# Patient Record
Sex: Female | Born: 1935 | Race: White | Hispanic: No | Marital: Married | Smoking: Never smoker
Health system: Southern US, Community
[De-identification: ages and names within clinical notes are randomized; demographics above are authoritative.]

## PROBLEM LIST (undated history)

## (undated) DIAGNOSIS — T4145XA Adverse effect of unspecified anesthetic, initial encounter: Secondary | ICD-10-CM

## (undated) DIAGNOSIS — M1990 Unspecified osteoarthritis, unspecified site: Secondary | ICD-10-CM

## (undated) DIAGNOSIS — E7800 Pure hypercholesterolemia, unspecified: Secondary | ICD-10-CM

## (undated) DIAGNOSIS — R42 Dizziness and giddiness: Secondary | ICD-10-CM

## (undated) DIAGNOSIS — A498 Other bacterial infections of unspecified site: Secondary | ICD-10-CM

## (undated) DIAGNOSIS — Z789 Other specified health status: Secondary | ICD-10-CM

## (undated) DIAGNOSIS — K529 Noninfective gastroenteritis and colitis, unspecified: Secondary | ICD-10-CM

## (undated) DIAGNOSIS — M5430 Sciatica, unspecified side: Secondary | ICD-10-CM

## (undated) DIAGNOSIS — T8859XA Other complications of anesthesia, initial encounter: Secondary | ICD-10-CM

## (undated) DIAGNOSIS — L219 Seborrheic dermatitis, unspecified: Secondary | ICD-10-CM

## (undated) HISTORY — PX: EAR BIOPSY: SHX1480

## (undated) HISTORY — PX: CATARACT EXTRACTION: SUR2

## (undated) HISTORY — PX: COLONOSCOPY: SHX174

## (undated) HISTORY — PX: LUMBAR DISC SURGERY: SHX700

## (undated) HISTORY — PX: WISDOM TOOTH EXTRACTION: SHX21

---

## 1960-11-18 HISTORY — PX: APPENDECTOMY: SHX54

## 1997-11-18 HISTORY — PX: HEMORROIDECTOMY: SUR656

## 1998-02-21 ENCOUNTER — Other Ambulatory Visit: Admission: RE | Admit: 1998-02-21 | Discharge: 1998-02-21 | Payer: Self-pay | Admitting: Gastroenterology

## 1998-03-06 ENCOUNTER — Inpatient Hospital Stay (HOSPITAL_COMMUNITY): Admission: AD | Admit: 1998-03-06 | Discharge: 1998-03-11 | Payer: Self-pay | Admitting: Internal Medicine

## 1998-06-01 ENCOUNTER — Other Ambulatory Visit: Admission: RE | Admit: 1998-06-01 | Discharge: 1998-06-01 | Payer: Self-pay | Admitting: Obstetrics and Gynecology

## 1998-06-01 ENCOUNTER — Other Ambulatory Visit: Admission: RE | Admit: 1998-06-01 | Discharge: 1998-06-01 | Payer: Self-pay | Admitting: *Deleted

## 1998-06-03 ENCOUNTER — Other Ambulatory Visit: Admission: RE | Admit: 1998-06-03 | Discharge: 1998-06-03 | Payer: Self-pay | Admitting: Gastroenterology

## 1998-06-06 ENCOUNTER — Other Ambulatory Visit: Admission: RE | Admit: 1998-06-06 | Discharge: 1998-06-06 | Payer: Self-pay | Admitting: *Deleted

## 1998-06-07 ENCOUNTER — Ambulatory Visit (HOSPITAL_COMMUNITY): Admission: RE | Admit: 1998-06-07 | Discharge: 1998-06-07 | Payer: Self-pay | Admitting: Gastroenterology

## 1998-06-08 ENCOUNTER — Ambulatory Visit (HOSPITAL_COMMUNITY): Admission: RE | Admit: 1998-06-08 | Discharge: 1998-06-08 | Payer: Self-pay | Admitting: Family Medicine

## 2004-10-23 ENCOUNTER — Emergency Department (HOSPITAL_COMMUNITY): Admission: EM | Admit: 2004-10-23 | Discharge: 2004-10-23 | Payer: Self-pay | Admitting: Emergency Medicine

## 2008-02-07 ENCOUNTER — Inpatient Hospital Stay (HOSPITAL_COMMUNITY): Admission: EM | Admit: 2008-02-07 | Discharge: 2008-02-09 | Payer: Self-pay | Admitting: Emergency Medicine

## 2008-02-08 ENCOUNTER — Encounter (INDEPENDENT_AMBULATORY_CARE_PROVIDER_SITE_OTHER): Payer: Self-pay | Admitting: Cardiology

## 2008-06-27 ENCOUNTER — Ambulatory Visit (HOSPITAL_BASED_OUTPATIENT_CLINIC_OR_DEPARTMENT_OTHER): Admission: RE | Admit: 2008-06-27 | Discharge: 2008-06-27 | Payer: Self-pay | Admitting: Family Medicine

## 2009-03-15 DIAGNOSIS — R42 Dizziness and giddiness: Secondary | ICD-10-CM | POA: Insufficient documentation

## 2009-03-17 ENCOUNTER — Ambulatory Visit: Payer: Self-pay | Admitting: Cardiology

## 2009-03-17 DIAGNOSIS — R0602 Shortness of breath: Secondary | ICD-10-CM | POA: Insufficient documentation

## 2009-03-17 DIAGNOSIS — R002 Palpitations: Secondary | ICD-10-CM | POA: Insufficient documentation

## 2009-03-20 ENCOUNTER — Encounter: Payer: Self-pay | Admitting: Cardiology

## 2009-03-29 ENCOUNTER — Ambulatory Visit: Payer: Self-pay

## 2009-03-29 ENCOUNTER — Encounter: Payer: Self-pay | Admitting: Cardiology

## 2009-03-30 ENCOUNTER — Telehealth: Payer: Self-pay | Admitting: Cardiology

## 2009-04-03 ENCOUNTER — Ambulatory Visit: Payer: Self-pay | Admitting: Cardiology

## 2009-04-03 DIAGNOSIS — E785 Hyperlipidemia, unspecified: Secondary | ICD-10-CM | POA: Insufficient documentation

## 2009-04-18 ENCOUNTER — Telehealth (INDEPENDENT_AMBULATORY_CARE_PROVIDER_SITE_OTHER): Payer: Self-pay | Admitting: *Deleted

## 2010-06-14 ENCOUNTER — Encounter: Admission: RE | Admit: 2010-06-14 | Discharge: 2010-06-14 | Payer: Self-pay | Admitting: Family Medicine

## 2011-04-02 NOTE — Procedures (Signed)
NAME:  Shelby Arellano, Shelby Arellano                 ACCOUNT NO.:  [id number]   MEDICAL RECORD NO.:  [id number]          PATIENT TYPE:  OUT   LOCATION:  SLEEP CENTER                 FACILITY:  [facility]   PHYSICIAN:  Clinton D. Maple Hudson, MD, FCCP, FACPDATE OF BIRTH:  08/30/1937   DATE OF STUDY:  06/27/2008                            NOCTURNAL POLYSOMNOGRAM   REFERRING PHYSICIAN:  Tammy R. Collins Scotland, M.D.   INDICATION FOR STUDY:  Insomnia with sleep apnea.   EPWORTH SLEEPINESS SCORE:  1/24, BMI 23, weight 130 pounds, height 63  inches, neck 11.5 inches.   HOME MEDICATIONS:  Charted and reviewed.   SLEEP ARCHITECTURE:  Total sleep time 272 minutes.  Sleep efficiency  69.6%.  Stage 1 was 6.2%, stage 2 was 80.9%, stage 3 was absent, REM  12.8% of total sleep time.  Sleep latency 64 minutes, REM latency 168  minutes.  Awake after sleep onset 47 minutes.  Arousal index 56.4,  indicating increased electroencephalogram arousal.  Tylenol PM was taken  at 10 p.m.   RESPIRATORY DATA:  Apnea/hypopnea index (AHI), 0.2 per hour.  Respiratory disturbance index (RDI), 3.7 per hour.  A single event was  scored as a hypopnea.  There were insufficient events to permit split  study protocol CPAP titration on the study night.   OXYGEN DATA:  Minimal snoring with oxygen desaturation to a Nadir of  93%.  Mean oxygen saturation through the study was 95.6% on room air.   CARDIAC DATA:  Sinus rhythm with occasional PAC.   MOVEMENT/PARASOMNIA:  A total of 12 limb jerks were counted, of which 9  were associated with arousal or awakening, for a periodic limb movement  with arousal index of 2 per hour.   IMPRESSIONS/RECOMMENDATIONS:  1. Sleep architecture not remarkable for sleep environment except for      repeated brief wakings through the night, consistent with her      history, but nonspecific.  2. Negligible respiratory sleep disturbance, apnea/hypopnea index 0.2      per hour, with minimal snoring and oxygen desaturation to  a Nadir      of 93%.  3. Periodic limb movement with arousal, mild, 2 per hour.  If clinical      history suggests that this is an important      issue at home, then specific therapy such as Requip or Mirapex      might be considered.  Otherwise consider treating her complaint as      a nonspecific insomnia.      Clinton D. Maple Hudson, MD, [facility], FACP  Diplomate, Biomedical engineer of Sleep Medicine  Electronically Signed     CDY/MEDQ  D:  07/02/2008 11:21:49  T:  07/02/2008 11:35:58  Job:  [id number]

## 2011-04-02 NOTE — Discharge Summary (Signed)
[name], [name]                 ACCOUNT NO.:  [id number]   MEDICAL RECORD NO.:  [id number]          PATIENT TYPE:  INP   LOCATION:  3708                         FACILITY:  [facility]   PHYSICIAN:  Francisca December, M.D.  DATE OF BIRTH:  [birth year]   DATE OF ADMISSION:  02/07/2008  DATE OF DISCHARGE:  02/09/2008                               DISCHARGE SUMMARY   DISCHARGE DIAGNOSES:  1. Chest pain, noncardiac.  2. Multiple medication allergies including Actonel, Cipro, Compazine,      Paxil, Phenergan, prednisone, and sodium pentothal.  3. Family history of hypertension.  4. Family history of myocardial infarction.   HOSPITAL COURSE:  Shelby Arellano is a 76 year old female who was admitted on  February 07, 2008, after several episodes of waking up at night, feeling  faint.  She was unable to go back to sleep.  Over the past several days  prior to admission, she noticed that she felt a balloon coming out of  her chest, but this was unrelated to activity and lasted up to several  hours.  On the day of admission, she developed a heaviness that was  different from the described above.  She describes the heaviness  extending from her head down to her abdomen associated with rapid  breathing panting.  She denies any palpitations, dizziness, syncope,  or other symptoms.  EMS was called.  In route, she received sublingual  nitroglycerin that gradually relieved symptoms, although they did not  resolve immediately.  She states that she is fatigued and is admitted to  the hospital.   As part of her cardiac workup, we did obtain a 12-lead EKG that showed  normal sinus rhythm with no acute ST-T wave changes.  Her lab studies  show essentially normal levels with the exception of her LDL which was  178.   We did perform an adenosine Cardiolite on her, and this study was  negative for ischemia.  We felt it was safe for her to go home, and we  asked her to follow up with primary care physician regarding  her  symptoms as well as long-term followup with her cholesterol.  She has  been educated on low-fat diet.   She is discharged home on her meds that she came in on, which include  calcium, magnesium, and zinc as well as a baby aspirin daily.   She is to call Dr. Amil Amen as needed for any further problems, otherwise  she is to follow up with Dr.  Collins Scotland, and she will call either today or tomorrow to make this  appointment.      Guy Franco, P.A.      Francisca December, M.D.  Electronically Signed    LB/MEDQ  D:  02/09/2008  T:  02/10/2008  Job:  [id number]   cc:   Tammy R. Collins Scotland, M.D.

## 2011-04-02 NOTE — H&P (Signed)
NAMEANI, DEOLIVEIRA                 ACCOUNT NO.:  [id number]   MEDICAL RECORD NO.:  [id number]          PATIENT TYPE:  INP   LOCATION:  3703                         FACILITY:  [facility]   PHYSICIAN:  Francisca December, M.D.  DATE OF BIRTH:  1935-09-19   DATE OF ADMISSION:  02/07/2008  DATE OF DISCHARGE:                              HISTORY & PHYSICAL   REASON FOR ADMISSION:  Chest discomfort, faintness.   HISTORY OF PRESENT ILLNESS:  Shelby Arellano is a pleasant 76 year old woman  without prior cardiac history who for the past 2-3 weeks has been waking  up at night with a feeling faint sensation and then being unable to go  back to sleep.  Over the past several days she has noticed a balloon  sensation coming out of her chest unrelated to activity.  It can last up  to several hours.  She awoke at midnight last night with this sensation  of coming out of a faint and also had a cold sweat.  Then, later in  the day she developed a heaviness like someone sitting on me but not  specifically to her chest.  She states it extends from her nose down to  her waist.  She has also had this sensation in the past several weeks.  Finally, in the morning after daylight she was sitting at rest after  eating breakfast and had the onset of dyspnea and was breathing rapidly  for no clear reason.  The heaviness recurred and because of these  multiple symptoms EMS was called.  She received sublingual nitroglycerin  en route to the hospital and states that this gradually relieved her  symptoms.  At the time of my evaluation at almost 1700 she is without  any symptoms but feels very fatigued.   PAST MEDICAL HISTORY:  Really quite unremarkable.  Denies any  hypertension or diabetes, no hyperlipidemia.  No significant surgeries.   DRUG ALLERGIES:  Are multiple and include:  1. ACTONEL.  2. CIPRO.  3. COMPAZINE.  4. PAXIL.  5. PHENERGAN.  6. PREDNISONE.  7. SODIUM PENTOTHAL.   She was hospitalized not long ago  and received Phenergan which caused  her to be out of touch with reality for some time.   CURRENT MEDICATIONS:  Calcium, magnesium and zinc combination, and a  baby aspirin 81 mg daily.   SOCIAL HISTORY:  She is accompanied by her son and husband here in the  emergency room.  She does not use any alcohol or tobacco products.  Her  son is a Development worker, community.   REVIEW OF SYSTEMS:  Negative except as mentioned above.  She denies any  constitutional symptoms such as fever, chills, weight loss.  She has  been more fatigued as mentioned in the last few weeks.  She is usually a  very active person.  She attempted a usual 3-mile walk with her husband  yesterday but could only complete about a mile because of tiredness, I  pooped out.  She denies any headache or visual changes.  No cough or  hemoptysis.  No abdominal pain, diarrhea,  hematemesis or melena.  No  dysuria, hematuria or nocturia.  No change in her muscle strength.  No  muscle pain.  No joint pain of significance.  No neurologic symptoms.   PHYSICAL EXAMINATION:  The blood pressure is 116/73, temperature is  97.9, pulse is 70 and regular, respiratory rate 18.  GENERAL:  This a pleasant, thin, 76 year old Caucasian woman in no  distress.  HEENT:  Unremarkable.  Head is atraumatic and normocephalic.  The pupils  are equal, round, reactive to light. Sclerae are anicteric.  Extraocular  movements are intact.  Oral mucosa is pink and moist.  Tongue is not  coated.  Teeth and gums in good repair.  NECK:  Supple without  thyromegaly or masses.  The carotid upstrokes are normal.  There is no  bruit.  There is no jugular venous distention.  CHEST:  Clear with adequate excursion bilaterally.  No wheezes, rales or  rhonchi.  HEART:  Has a regular rhythm.  Normal S1 and S2 is heard.  No S3, S4,  murmur, click or rub noted.  ABDOMEN:  Soft, flat, nontender.  No hepatosplenomegaly or midline  pulsatile mass.  EXTERNAL GENITALIA:  Atrophic without  lesions.  RECTAL:  Not performed.  EXTREMITIES:  Show full range of motion.  No edema.  Intact distal  pulses.  NEUROLOGIC:  Cranial nerves II-XII intact.  Motor and sensory grossly  intact.  Gait not tested.  SKIN:  Warm, dry and clear.   ACCESSORY CLINICAL DATA:  Serum electrolytes, BUN, creatinine, glucose  all within normal limits.  Point-of-care enzymes here in the emergency  room x1 negative.  Hemoglobin 15.6, hematocrit 46.  Chest x-ray shows no  active cardiopulmonary disease.  Electrocardiogram shows left atrial  abnormality.  There is a Q wave in V2, although it looks almost  identical to V1 and may be positional in nature, as well as the P wave  is persistently inverted.  Her ST segments are at baseline on this  tracing and it looks quite normal other than an RSR prime pattern.  Her  tracing from 2005 has elevated ST segments in the anterior and lateral  precordial leads of about 1 to 1.5 mm and there is no Q wave in V2.   ASSESSMENT:  1. A constellation of symptoms which include discomfort in the chest,      sweating, faint feeling and dyspnea.  Certainly it would be      important to fully evaluate her cardiac status and rule out      coronary artery disease.  2. She has multiple medication intolerances and we will have to be      careful with regard to this during her hospitalization.  3. EKG changes are present that are not diagnostic but suspicious for      coronary disease.   PLAN:  1. The patient is admitted to the hospital for telemetry monitoring      and serial CK-MB and troponin enzymes.  2. Repeat ECG in the morning.  3. If above negative, we will proceed with noninvasive study in the      way of exercise Cardiolite.  The patient could potentially walk on      the treadmill.  4. Will hold off on any anticoagulants at this time other than      aspirin, and given her sensitivity to multiple medications really      try to avoid anything that is not absolutely  necessary.  Francisca December, M.D.  Electronically Signed    JHE/MEDQ  D:  02/07/2008  T:  02/07/2008  Job:  [id number]   cc:   Tammy R. Collins Scotland, M.D.

## 2011-06-14 ENCOUNTER — Encounter: Payer: Self-pay | Admitting: Cardiology

## 2011-06-20 ENCOUNTER — Encounter: Payer: Self-pay | Admitting: Cardiology

## 2011-07-01 ENCOUNTER — Other Ambulatory Visit: Payer: Self-pay | Admitting: Family Medicine

## 2011-07-01 DIAGNOSIS — Z1231 Encounter for screening mammogram for malignant neoplasm of breast: Secondary | ICD-10-CM

## 2011-07-10 ENCOUNTER — Ambulatory Visit
Admission: RE | Admit: 2011-07-10 | Discharge: 2011-07-10 | Disposition: A | Discharge status: Home / Self Care | Payer: Medicare Other | Source: Ambulatory Visit | Attending: Family Medicine | Admitting: Family Medicine

## 2011-07-10 DIAGNOSIS — Z1231 Encounter for screening mammogram for malignant neoplasm of breast: Secondary | ICD-10-CM

## 2011-08-12 LAB — LIPID PANEL
Cholesterol: 253 — ABNORMAL HIGH
HDL: 63
LDL Cholesterol: 178 — ABNORMAL HIGH
Total CHOL/HDL Ratio: 4
Triglycerides: 58
VLDL: 12

## 2011-08-12 LAB — CARDIAC PANEL(CRET KIN+CKTOT+MB+TROPI)
CK, MB: 0.5
CK, MB: 0.8
Relative Index: INVALID
Relative Index: INVALID
Total CK: 33
Total CK: 44
Troponin I: <0.01
Troponin I: <0.01

## 2011-08-12 LAB — POCT I-STAT CREATININE
Creatinine, Ser: 1
Operator id: 284141

## 2011-08-12 LAB — CBC
HCT: 43.5
Hemoglobin: 14.3
MCHC: 32.8
MCV: 95.6
Platelets: 190
RBC: 4.55
RDW: 14.7
WBC: 8

## 2011-08-12 LAB — I-STAT 8, (EC8 V) (CONVERTED LAB)
Acid-base deficit: 1
BUN: 12
Bicarbonate: 24.1 — ABNORMAL HIGH
Chloride: 107
Glucose, Bld: 80
HCT: 46
Hemoglobin: 15.6 — ABNORMAL HIGH
Operator id: 284141
Potassium: 3.9
Sodium: 138
TCO2: 25
pCO2, Ven: 38.8 — ABNORMAL LOW
pH, Ven: 7.401 — ABNORMAL HIGH

## 2011-08-12 LAB — POCT CARDIAC MARKERS
CKMB, poc: <1 — ABNORMAL LOW
Myoglobin, poc: 22.2
Operator id: 284141
Troponin i, poc: <0.05

## 2011-08-12 LAB — COMPREHENSIVE METABOLIC PANEL
ALT: 14
AST: 26
Albumin: 3.3 — ABNORMAL LOW
Alkaline Phosphatase: 54
BUN: 11
CO2: 22
Calcium: 8.8
Chloride: 104
Creatinine, Ser: 1
GFR calc Af Amer: >60
GFR calc non Af Amer: 55 — ABNORMAL LOW
Glucose, Bld: 126 — ABNORMAL HIGH
Potassium: 3.6
Sodium: 136
Total Bilirubin: 0.8
Total Protein: 6.1

## 2011-08-12 LAB — TSH: TSH: 2.216

## 2011-08-12 LAB — CK TOTAL AND CKMB (NOT AT ARMC)
CK, MB: 0.4
Relative Index: INVALID
Total CK: 38

## 2011-08-12 LAB — TROPONIN I: Troponin I: <0.01

## 2011-08-19 HISTORY — PX: CARPAL TUNNEL RELEASE: SHX101

## 2011-08-22 ENCOUNTER — Encounter (HOSPITAL_BASED_OUTPATIENT_CLINIC_OR_DEPARTMENT_OTHER)
Admission: RE | Admit: 2011-08-22 | Discharge: 2011-08-22 | Disposition: A | Discharge status: Home / Self Care | Payer: Medicare Other | Source: Ambulatory Visit | Attending: Orthopedic Surgery | Admitting: Orthopedic Surgery

## 2011-08-22 ENCOUNTER — Ambulatory Visit
Admission: RE | Admit: 2011-08-22 | Discharge: 2011-08-22 | Disposition: A | Discharge status: Home / Self Care | Payer: Medicare Other | Source: Ambulatory Visit | Attending: Orthopedic Surgery | Admitting: Orthopedic Surgery

## 2011-08-22 ENCOUNTER — Other Ambulatory Visit: Payer: Self-pay | Admitting: Orthopedic Surgery

## 2011-08-22 DIAGNOSIS — Z01811 Encounter for preprocedural respiratory examination: Secondary | ICD-10-CM

## 2011-08-27 ENCOUNTER — Ambulatory Visit (HOSPITAL_BASED_OUTPATIENT_CLINIC_OR_DEPARTMENT_OTHER)
Admission: RE | Admit: 2011-08-27 | Discharge: 2011-08-27 | Disposition: A | Discharge status: Home / Self Care | Payer: Medicare Other | Source: Ambulatory Visit | Attending: Orthopedic Surgery | Admitting: Orthopedic Surgery

## 2011-08-27 DIAGNOSIS — Z01812 Encounter for preprocedural laboratory examination: Secondary | ICD-10-CM | POA: Insufficient documentation

## 2011-08-27 DIAGNOSIS — G5600 Carpal tunnel syndrome, unspecified upper limb: Secondary | ICD-10-CM | POA: Insufficient documentation

## 2011-08-27 DIAGNOSIS — M129 Arthropathy, unspecified: Secondary | ICD-10-CM | POA: Insufficient documentation

## 2011-08-27 DIAGNOSIS — G609 Hereditary and idiopathic neuropathy, unspecified: Secondary | ICD-10-CM | POA: Insufficient documentation

## 2011-08-27 DIAGNOSIS — Z01810 Encounter for preprocedural cardiovascular examination: Secondary | ICD-10-CM | POA: Insufficient documentation

## 2011-08-27 LAB — POCT HEMOGLOBIN-HEMACUE: Hemoglobin: 13.7 g/dL (ref 12.0–15.0)

## 2011-09-03 NOTE — Op Note (Signed)
  NAMEHAIVEN, Shelby Arellano                 ACCOUNT NO.:  [id number]  MEDICAL RECORD NO.:  [id number]  LOCATION:                                 FACILITY:  PHYSICIAN:  Cindee Salt, M.D.       DATE OF BIRTH:  04-10-1935  DATE OF PROCEDURE:  08/27/2011 DATE OF DISCHARGE:                              OPERATIVE REPORT   PREOPERATIVE DIAGNOSIS:  Carpal tunnel syndrome, right hand.  POSTOPERATIVE DIAGNOSIS:  Carpal tunnel syndrome, right hand.  OPERATION:  Decompression, right median nerve.  SURGEON:  Cindee Salt, MD  ANESTHESIA:  Forearm-based IV regional.  HISTORY:  The patient is a 76 year old female with a history of carpal tunnel syndrome, EMG nerve conductions positive which has not responded to conservative treatment.  Pre, peri, and postoperative course were discussed along with risks and complications.  She is aware that there is no guarantee with surgery, possibility of infection, recurrence of injury to arteries, nerves, and tendons, incomplete relief of symptoms, and dystrophy.  In the preoperative area, the patient is seen, the extremity is marked by both the patient and surgeon, and antibiotic is given.  PROCEDURE:  The patient was brought to the operating room where a forearm-based IV regional anesthetic was carried out without difficulty. She was prepped using ChloraPrep, supine position with the right arm free.  A 3-minute dry time was allowed.  A time-out was taken confirming the patient and procedure.  A longitudinal incision was made in the palm and carried down through the subcutaneous tissue.  Bleeders were electrocauterized with bipolar.  The palmar fascia was split. Superficial palmar arch was identified.  The flexor tendon to the ring and little finger was identified.  Retractor was placed to the ulnar side of the median nerve.  Carpal retinaculum was incised with sharp dissection.  Right angle and Sewall retractor were placed between skin and forearm fascia.   The fascia was released for approximately 1-1/2 proximal to the wrist crease under direct vision.  Canal was explored. Air compression to the nerve was apparent.  No further lesions were identified.  The wound was irrigated.  The skin was then closed with interrupted 5-0 Vicryl repeat sutures.  A local infiltration with 0.25% Marcaine without epinephrine was given, approximately 9 mL was used. Sterile compressive dressing was applied.  On deflation of the tourniquet, all fingers immediately pinked.  The patient tolerated the procedure well and was taken to the recovery room for observation in satisfactory condition.  DISCHARGE SUMMARY:  She will be discharged home to return to the [facility] [place] in 1 week on Vicodin.          ______________________________ Cindee Salt, M.D.     GK/MEDQ  D:  08/27/2011  T:  08/27/2011  Job:  [id number]  Electronically Signed by Cindee Salt M.D. on 09/03/2011 04:27:06 PM

## 2012-02-06 ENCOUNTER — Other Ambulatory Visit: Payer: Self-pay | Admitting: Orthopedic Surgery

## 2012-02-20 ENCOUNTER — Encounter (HOSPITAL_BASED_OUTPATIENT_CLINIC_OR_DEPARTMENT_OTHER): Payer: Self-pay | Admitting: *Deleted

## 2012-02-20 NOTE — Progress Notes (Signed)
No labs needed

## 2012-02-25 ENCOUNTER — Encounter (HOSPITAL_BASED_OUTPATIENT_CLINIC_OR_DEPARTMENT_OTHER): Payer: Self-pay | Admitting: Anesthesiology

## 2012-02-25 ENCOUNTER — Encounter (HOSPITAL_BASED_OUTPATIENT_CLINIC_OR_DEPARTMENT_OTHER): Payer: Self-pay | Admitting: Certified Registered"

## 2012-02-25 ENCOUNTER — Encounter (HOSPITAL_BASED_OUTPATIENT_CLINIC_OR_DEPARTMENT_OTHER): Payer: Self-pay | Admitting: Orthopedic Surgery

## 2012-02-25 ENCOUNTER — Encounter (HOSPITAL_BASED_OUTPATIENT_CLINIC_OR_DEPARTMENT_OTHER)
Admission: RE | Disposition: A | Discharge status: Home / Self Care | Payer: Self-pay | Source: Ambulatory Visit | Attending: Orthopedic Surgery

## 2012-02-25 ENCOUNTER — Ambulatory Visit (HOSPITAL_BASED_OUTPATIENT_CLINIC_OR_DEPARTMENT_OTHER): Payer: Medicare Other | Admitting: Certified Registered"

## 2012-02-25 ENCOUNTER — Ambulatory Visit (HOSPITAL_BASED_OUTPATIENT_CLINIC_OR_DEPARTMENT_OTHER)
Admission: RE | Admit: 2012-02-25 | Discharge: 2012-02-25 | Disposition: A | Discharge status: Home / Self Care | Payer: Medicare Other | Source: Ambulatory Visit | Attending: Orthopedic Surgery | Admitting: Orthopedic Surgery

## 2012-02-25 DIAGNOSIS — G5600 Carpal tunnel syndrome, unspecified upper limb: Secondary | ICD-10-CM | POA: Insufficient documentation

## 2012-02-25 HISTORY — DX: Unspecified osteoarthritis, unspecified site: M19.90

## 2012-02-25 HISTORY — DX: Other complications of anesthesia, initial encounter: T88.59XA

## 2012-02-25 HISTORY — DX: Other specified health status: Z78.9

## 2012-02-25 HISTORY — DX: Noninfective gastroenteritis and colitis, unspecified: K52.9

## 2012-02-25 HISTORY — PX: CARPAL TUNNEL RELEASE: SHX101

## 2012-02-25 HISTORY — DX: Adverse effect of unspecified anesthetic, initial encounter: T41.45XA

## 2012-02-25 LAB — POCT HEMOGLOBIN-HEMACUE: Hemoglobin: 13 g/dL (ref 12.0–15.0)

## 2012-02-25 SURGERY — CARPAL TUNNEL RELEASE
Anesthesia: Regional | Site: Hand | Laterality: Left | Wound class: Clean

## 2012-02-25 MED ORDER — CHLORHEXIDINE GLUCONATE 4 % EX LIQD: 60.0000 mL | Freq: Once | CUTANEOUS | Status: DC

## 2012-02-25 MED ORDER — METOCLOPRAMIDE HCL 5 MG/ML IJ SOLN: 10.0000 mg | Freq: Once | INTRAMUSCULAR | Status: DC | PRN

## 2012-02-25 MED ORDER — LACTATED RINGERS IV SOLN
INTRAVENOUS | Status: DC
  Administered 2012-02-25 (×2): via INTRAVENOUS

## 2012-02-25 MED ORDER — CEFAZOLIN SODIUM 1-5 GM-% IV SOLN
1.0000 g | INTRAVENOUS | Status: AC
  Administered 2012-02-25: 1 g via INTRAVENOUS

## 2012-02-25 MED ORDER — LIDOCAINE HCL (PF) 0.5 % IJ SOLN
INTRAMUSCULAR | Status: DC | PRN
  Administered 2012-02-25: 30 mL via INTRATHECAL

## 2012-02-25 MED ORDER — BUPIVACAINE HCL (PF) 0.25 % IJ SOLN
INTRAMUSCULAR | Status: DC | PRN
  Administered 2012-02-25: 6 mL

## 2012-02-25 MED ORDER — 0.9 % SODIUM CHLORIDE (POUR BTL) OPTIME
TOPICAL | Status: DC | PRN
  Administered 2012-02-25: 100 mL

## 2012-02-25 MED ORDER — PROPOFOL 10 MG/ML IV BOLUS
INTRAVENOUS | Status: DC | PRN
  Administered 2012-02-25 (×3): 20 mg via INTRAVENOUS
  Administered 2012-02-25: 10 mg via INTRAVENOUS

## 2012-02-25 MED ORDER — FENTANYL CITRATE 0.05 MG/ML IJ SOLN: 25.0000 ug | INTRAMUSCULAR | Status: DC | PRN

## 2012-02-25 SURGICAL SUPPLY — 37 items
BANDAGE GAUZE ELAST BULKY 4 IN (GAUZE/BANDAGES/DRESSINGS) ×2 IMPLANT
BLADE SURG 15 STRL LF DISP TIS (BLADE) ×1 IMPLANT
BLADE SURG 15 STRL SS (BLADE) ×2
BNDG CMPR 9X4 STRL LF SNTH (GAUZE/BANDAGES/DRESSINGS)
BNDG COHESIVE 3X5 TAN STRL LF (GAUZE/BANDAGES/DRESSINGS) ×2 IMPLANT
BNDG ESMARK 4X9 LF (GAUZE/BANDAGES/DRESSINGS) IMPLANT
CHLORAPREP W/TINT 26ML (MISCELLANEOUS) ×2 IMPLANT
CLOTH BEACON ORANGE TIMEOUT ST (SAFETY) ×2 IMPLANT
CORDS BIPOLAR (ELECTRODE) ×2 IMPLANT
COVER MAYO STAND STRL (DRAPES) ×2 IMPLANT
COVER TABLE BACK 60X90 (DRAPES) ×2 IMPLANT
CUFF TOURNIQUET SINGLE 18IN (TOURNIQUET CUFF) ×2 IMPLANT
DRAPE EXTREMITY T 121X128X90 (DRAPE) ×2 IMPLANT
DRAPE SURG 17X23 STRL (DRAPES) ×2 IMPLANT
DRSG KUZMA FLUFF (GAUZE/BANDAGES/DRESSINGS) ×2 IMPLANT
GAUZE XEROFORM 1X8 LF (GAUZE/BANDAGES/DRESSINGS) ×2 IMPLANT
GLOVE BIO SURGEON STRL SZ 6.5 (GLOVE) ×2 IMPLANT
GLOVE BIO SURGEON STRL SZ7 (GLOVE) ×1 IMPLANT
GLOVE INDICATOR 7.0 STRL GRN (GLOVE) ×1 IMPLANT
GLOVE SURG ORTHO 8.0 STRL STRW (GLOVE) ×2 IMPLANT
GOWN BRE IMP PREV XXLGXLNG (GOWN DISPOSABLE) ×2 IMPLANT
GOWN PREVENTION PLUS XLARGE (GOWN DISPOSABLE) ×2 IMPLANT
NEEDLE 27GAX1X1/2 (NEEDLE) ×1 IMPLANT
NS IRRIG 1000ML POUR BTL (IV SOLUTION) ×2 IMPLANT
PACK BASIN DAY SURGERY FS (CUSTOM PROCEDURE TRAY) ×2 IMPLANT
PAD CAST 3X4 CTTN HI CHSV (CAST SUPPLIES) ×1 IMPLANT
PADDING CAST ABS 4INX4YD NS (CAST SUPPLIES) ×1
PADDING CAST ABS COTTON 4X4 ST (CAST SUPPLIES) ×1 IMPLANT
PADDING CAST COTTON 3X4 STRL (CAST SUPPLIES) ×2
SPONGE GAUZE 4X4 12PLY (GAUZE/BANDAGES/DRESSINGS) ×2 IMPLANT
STOCKINETTE 4X48 STRL (DRAPES) ×2 IMPLANT
SUT VICRYL 4-0 PS2 18IN ABS (SUTURE) IMPLANT
SUT VICRYL RAPIDE 4/0 PS 2 (SUTURE) ×2 IMPLANT
SYR BULB 3OZ (MISCELLANEOUS) ×2 IMPLANT
SYR CONTROL 10ML LL (SYRINGE) ×1 IMPLANT
TOWEL OR 17X24 6PK STRL BLUE (TOWEL DISPOSABLE) ×2 IMPLANT
UNDERPAD 30X30 INCONTINENT (UNDERPADS AND DIAPERS) ×2 IMPLANT

## 2012-02-25 NOTE — Discharge Instructions (Addendum)

## 2012-02-25 NOTE — Anesthesia Preprocedure Evaluation (Addendum)
Anesthesia Evaluation  Patient identified by MRN, date of birth, ID band Patient awake    Reviewed: Allergy & Precautions, H&P , NPO status , Patient's Chart, lab work & pertinent test results, reviewed documented beta blocker date and time   History of Anesthesia Complications (+) PROLONGED EMERGENCE  Airway Mallampati: II TM Distance: >3 FB Neck ROM: full    Dental  (+) Dental Advisory Given   Pulmonary shortness of breath and with exertion,          Cardiovascular negative cardio ROS      Neuro/Psych negative neurological ROS  negative psych ROS   GI/Hepatic negative GI ROS, Neg liver ROS,   Endo/Other  negative endocrine ROS  Renal/GU negative Renal ROS  negative genitourinary   Musculoskeletal   Abdominal   Peds  Hematology negative hematology ROS (+)   Anesthesia Other Findings See surgeon's H&P   Reproductive/Obstetrics negative OB ROS                        Anesthesia Physical Anesthesia Plan  ASA: II  Anesthesia Plan: MAC and Bier Block   Post-op Pain Management:    Induction: Intravenous  Airway Management Planned: Simple Face Mask  Additional Equipment:   Intra-op Plan:   Post-operative Plan:   Informed Consent: I have reviewed the patients History and Physical, chart, labs and discussed the procedure including the risks, benefits and alternatives for the proposed anesthesia with the patient or authorized representative who has indicated his/her understanding and acceptance.     Plan Discussed with: CRNA and Surgeon  Anesthesia Plan Comments:         Anesthesia Quick Evaluation

## 2012-02-25 NOTE — Op Note (Signed)
Dictated number: [id number]

## 2012-02-25 NOTE — Transfer of Care (Signed)
Immediate Anesthesia Transfer of Care Note  Patient: Shelby Arellano  Procedure(s) Performed: Procedure(s) (LRB): CARPAL TUNNEL RELEASE (Left)  Patient Location: PACU  Anesthesia Type: MAC and Bier block  Level of Consciousness: awake, alert  and oriented  Airway & Oxygen Therapy: Patient Spontanous Breathing and Patient connected to face mask oxygen  Post-op Assessment: Report given to PACU RN and Post -op Vital signs reviewed and stable  Post vital signs: Reviewed and stable  Complications: No apparent anesthesia complications

## 2012-02-25 NOTE — Op Note (Signed)
NAMECHERYLL, Arellano NO.:  [id number]  MEDICAL RECORD NO.:  [id number]  LOCATION:                                 FACILITY:  PHYSICIAN:  Cindee Salt, M.D.            DATE OF BIRTH:  DATE OF PROCEDURE:  02/25/2012 DATE OF DISCHARGE:                              OPERATIVE REPORT   PREOPERATIVE DIAGNOSIS:  Carpal tunnel syndrome, left hand.  POSTOPERATIVE DIAGNOSIS:  Carpal tunnel syndrome, left hand.  OPERATION:  Decompression of left median nerve.  SURGEON:  Cindee Salt, M.D.  ANESTHESIA:  Forearm-based IV regional with local infiltration.  ANESTHESIOLOGISTS:  Janetta Hora. Gelene Mink, M.D.  HISTORY:  The patient is a 77 year old female with a history of bilateral carpal tunnel syndrome.  Nerve conductions positive.  This has not responded to conservative treatment.  She has elected to undergo surgical release.  She has undergone to the right side and is admitted now for the left.  Pre, peri, postoperative course has been discussed along with risks and complications.  She is aware that there is no guarantee with surgery, possibility of infection, recurrence, injury to arteries, nerves, tendons, incomplete relief of symptoms, dystrophy. Preoperative area, the patient is seen, the extremity marked by both the patient and surgeon.  Antibiotic given.  PROCEDURE:  The patient was brought to the operating room where forearm- based IV regional anesthetic was carried out without difficulty.  She was prepped using ChloraPrep, supine position, left arm free.  A 3 minutes dry time was allowed.  Time-out taken for the patient and procedure.  A longitudinal incision was made in the palm, carried down through subcutaneous tissue.  Bleeders were electrocauterized.  Palmar fascia was split.  Superficial palmar arch identified.  The flexor tendon to the ring and little finger identified to the ulnar side of median nerve.  The carpal retinaculum was incised with sharp  dissection. A right angle and Sewall retractor were placed between skin and forearm fascia.  The fascia released for approximately a cm and half proximal to the wrist crease under direct vision.  Canal was explored.  Air compression to the nerve was apparent.  No further lesions were identified.  Wound was irrigated.  Skin closed with interrupted 4-0 Vicryl Rapide sutures.  Local infiltration with 0.25% Marcaine without epinephrine was given, 6 mL was used.  Sterile compressive dressing with fingers free applied.  On deflation of the tourniquet, all fingers immediately pinked, and she was taken to the recovery room for observation in satisfactory condition.          ______________________________ Cindee Salt, M.D.     GK/MEDQ  D:  02/25/2012  T:  02/25/2012  Job:  [id number]

## 2012-02-25 NOTE — Brief Op Note (Signed)
02/25/2012  9:08 AM  PATIENT:  Herbie Drape  77 y.o. female  PRE-OPERATIVE DIAGNOSIS:  LEFT Carpal Tunnel Syndrome  POST-OPERATIVE DIAGNOSIS:  LEFT Carpal Tunnel Syndrome  PROCEDURE:  Procedure(s) (LRB): CARPAL TUNNEL RELEASE (Left)  SURGEON:  Surgeon(s) and Role:    * Nicki Reaper, MD - Primary  PHYSICIAN ASSISTANT:   ASSISTANTS: none   ANESTHESIA:   local and regional  EBL:  Total I/O In: 500 [I.V.:500] Out: -   BLOOD ADMINISTERED:none  DRAINS: none   LOCAL MEDICATIONS USED:  MARCAINE     SPECIMEN:  No Specimen  DISPOSITION OF SPECIMEN:  N/A  COUNTS:  YES  TOURNIQUET:   Total Tourniquet Time Documented: Forearm (Left) - 18 minutes  DICTATION: .Other Dictation: Dictation Number [id number]  PLAN OF CARE: Discharge to home after PACU  PATIENT DISPOSITION:  PACU - hemodynamically stable.

## 2012-02-25 NOTE — Anesthesia Postprocedure Evaluation (Signed)
Anesthesia Post Note  Patient: Shelby Arellano  Procedure(s) Performed: Procedure(s) (LRB): CARPAL TUNNEL RELEASE (Left)  Anesthesia type: MAC  Patient location: PACU  Post pain: Pain level controlled  Post assessment: Patient's Cardiovascular Status Stable  Last Vitals:  Filed Vitals:   02/25/12 0945  BP: 137/77  Pulse: 62  Temp:   Resp: 13    Post vital signs: Reviewed and stable  Level of consciousness: alert  Complications: No apparent anesthesia complications

## 2012-02-25 NOTE — H&P (Signed)
Shelby Arellano is 77 year old and is seen for examination of her carpal tunnel release right hand which is doing well. Her left side continues to bother her with numbness and tingling. She has a positive Tinel's, deep pressure over the carpal canal. Her nerve conduction test are positive for carpal tunnel syndrome. There is no atrophy to the thenar intrinsics.  She is also complaining of catching of her right thumb. This began approximately one month ago.   ALLERGIES:   Sodium pentothal, prednisone, Phenergan, Compazine, promethazine, Paxil, Cipro and Actonel.  Advised never to take "broad-spectrum" antibiotics by [facility] in 1999.  MEDICATIONS:    Calcium 1200 mg., vitamin D 2000 units, fish oil 2000 mg.  SURGICAL HISTORY:    Wisdom teeth removal, appendectomy, hemorrhoidectomy, removal of sun damaged skin.   FAMILY MEDICAL HISTORY:   Positive for high blood pressure and arthritis.  SOCIAL HISTORY:    She does not smoke or drink.  She is married and works as a Nurse, learning disability.  REVIEW OF SYSTEMS:    Positive for ringing in her ears, easy bruising, sleep disorder, otherwise negative Shelby Arellano is an 78 y.o. female.   Chief Complaint: CTS HPI: see above  Past Medical History  Diagnosis Date  . Complication of anesthesia     cannot have pentothol  . Arthritis   . Chronic diarrhea   . No pertinent past medical history     Past Surgical History  Procedure Date  . Appendectomy 1962  . Hemorroidectomy 1999  . Wisdom tooth extraction   . Carpal tunnel release 10/12    rt   . Ear biopsy     growth lt ear  . Colonoscopy     No family history on file. Social History:  reports that she has never smoked. She does not have any smokeless tobacco history on file. She reports that she does not drink alcohol or use illicit drugs.  Allergies:  Allergies  Allergen Reactions  . Ciprofloxacin   . Paroxetine   . Prednisone     REACTION: hyper  . Prochlorperazine Edisylate     REACTION:  dizziness  . Promethazine Hcl     REACTION: hallucination  . Risedronate Sodium     No current facility-administered medications on file as of .   Medications Prior to Admission  Medication Sig Dispense Refill  . calcium-vitamin D (OSCAL WITH D) 500-200 MG-UNIT per tablet Take 1 tablet by mouth daily.      Marland Kitchen loperamide (IMODIUM) 2 MG capsule Take 2 mg by mouth 4 (four) times daily as needed.      . saccharomyces boulardii (FLORASTOR) 250 MG capsule Take 250 mg by mouth 2 (two) times daily.        No results found for this or any previous visit (from the past 48 hour(s)).  No results found.   Pertinent items are noted in HPI.  There were no vitals taken for this visit.  General appearance: alert, cooperative and appears stated age Head: Normocephalic, without obvious abnormality Neck: no adenopathy Resp: clear to auscultation bilaterally Cardio: regular rate and rhythm, S1, S2 normal, no murmur, click, rub or gallop GI: soft, non-tender; bowel sounds normal; no masses,  no organomegaly Extremities: extremities normal, atraumatic, no cyanosis or edema Pulses: 2+ and symmetric Skin: Skin color, texture, turgor normal. No rashes or lesions Neurologic: Grossly normal Incision/Wound: na  Assessment/Plan She is scheduled for left carpal tunnel release as an outpatient.  Shelby Arellano R 02/25/2012, 4:33 AM

## 2012-02-26 ENCOUNTER — Encounter (HOSPITAL_BASED_OUTPATIENT_CLINIC_OR_DEPARTMENT_OTHER): Payer: Self-pay | Admitting: Orthopedic Surgery

## 2012-07-27 ENCOUNTER — Other Ambulatory Visit: Payer: Self-pay | Admitting: Family Medicine

## 2012-07-27 DIAGNOSIS — Z1231 Encounter for screening mammogram for malignant neoplasm of breast: Secondary | ICD-10-CM

## 2012-08-12 ENCOUNTER — Ambulatory Visit
Admission: RE | Admit: 2012-08-12 | Discharge: 2012-08-12 | Disposition: A | Discharge status: Home / Self Care | Payer: Medicare Other | Source: Ambulatory Visit | Attending: Family Medicine | Admitting: Family Medicine

## 2012-08-12 DIAGNOSIS — Z1231 Encounter for screening mammogram for malignant neoplasm of breast: Secondary | ICD-10-CM

## 2013-07-23 ENCOUNTER — Other Ambulatory Visit: Payer: Self-pay

## 2013-07-23 DIAGNOSIS — Z1231 Encounter for screening mammogram for malignant neoplasm of breast: Secondary | ICD-10-CM

## 2013-08-16 ENCOUNTER — Ambulatory Visit
Admission: RE | Admit: 2013-08-16 | Discharge: 2013-08-16 | Disposition: A | Discharge status: Home / Self Care | Payer: Medicare Other | Source: Ambulatory Visit

## 2013-08-16 DIAGNOSIS — Z1231 Encounter for screening mammogram for malignant neoplasm of breast: Secondary | ICD-10-CM

## 2014-01-10 ENCOUNTER — Other Ambulatory Visit: Payer: Self-pay | Admitting: Family Medicine

## 2014-01-10 ENCOUNTER — Ambulatory Visit
Admission: RE | Admit: 2014-01-10 | Discharge: 2014-01-10 | Disposition: A | Discharge status: Home / Self Care | Payer: Medicare Other | Source: Ambulatory Visit | Attending: Family Medicine | Admitting: Family Medicine

## 2014-01-10 DIAGNOSIS — M545 Low back pain, unspecified: Secondary | ICD-10-CM

## 2014-07-26 ENCOUNTER — Other Ambulatory Visit: Payer: Self-pay

## 2014-07-26 DIAGNOSIS — Z1231 Encounter for screening mammogram for malignant neoplasm of breast: Secondary | ICD-10-CM

## 2014-08-18 ENCOUNTER — Ambulatory Visit
Admission: RE | Admit: 2014-08-18 | Discharge: 2014-08-18 | Disposition: A | Discharge status: Home / Self Care | Payer: Medicare Other | Source: Ambulatory Visit

## 2014-08-18 DIAGNOSIS — Z1231 Encounter for screening mammogram for malignant neoplasm of breast: Secondary | ICD-10-CM

## 2016-05-23 ENCOUNTER — Encounter (HOSPITAL_COMMUNITY): Payer: Self-pay

## 2016-05-23 ENCOUNTER — Emergency Department (HOSPITAL_COMMUNITY)
Admission: EM | Admit: 2016-05-23 | Discharge: 2016-05-23 | Disposition: A | Discharge status: Home / Self Care | Payer: Medicare Other | Attending: Emergency Medicine | Admitting: Emergency Medicine

## 2016-05-23 ENCOUNTER — Emergency Department (HOSPITAL_COMMUNITY): Payer: Medicare Other

## 2016-05-23 DIAGNOSIS — R0789 Other chest pain: Secondary | ICD-10-CM | POA: Insufficient documentation

## 2016-05-23 DIAGNOSIS — M1990 Unspecified osteoarthritis, unspecified site: Secondary | ICD-10-CM | POA: Insufficient documentation

## 2016-05-23 DIAGNOSIS — R079 Chest pain, unspecified: Secondary | ICD-10-CM

## 2016-05-23 DIAGNOSIS — R42 Dizziness and giddiness: Secondary | ICD-10-CM | POA: Diagnosis not present

## 2016-05-23 HISTORY — DX: Pure hypercholesterolemia, unspecified: E78.00

## 2016-05-23 HISTORY — DX: Seborrheic dermatitis, unspecified: L21.9

## 2016-05-23 HISTORY — DX: Sciatica, unspecified side: M54.30

## 2016-05-23 HISTORY — DX: Dizziness and giddiness: R42

## 2016-05-23 HISTORY — DX: Other bacterial infections of unspecified site: A49.8

## 2016-05-23 LAB — CBC
HCT: 41.2 % (ref 36.0–46.0)
Hemoglobin: 13.8 g/dL (ref 12.0–15.0)
MCH: 31.1 pg (ref 26.0–34.0)
MCHC: 33.5 g/dL (ref 30.0–36.0)
MCV: 92.8 fL (ref 78.0–100.0)
Platelets: 218 10*3/uL (ref 150–400)
RBC: 4.44 MIL/uL (ref 3.87–5.11)
RDW: 14.3 % (ref 11.5–15.5)
WBC: 6.7 10*3/uL (ref 4.0–10.5)

## 2016-05-23 LAB — URINALYSIS, ROUTINE W REFLEX MICROSCOPIC
Bilirubin Urine: NEGATIVE
Glucose, UA: NEGATIVE mg/dL
Hgb urine dipstick: NEGATIVE
Ketones, ur: NEGATIVE mg/dL
Nitrite: NEGATIVE
Protein, ur: NEGATIVE mg/dL
Specific Gravity, Urine: 1.006 (ref 1.005–1.030)
pH: 7 (ref 5.0–8.0)

## 2016-05-23 LAB — BASIC METABOLIC PANEL
Anion gap: 7 (ref 5–15)
BUN: 10 mg/dL (ref 6–20)
CO2: 28 mmol/L (ref 22–32)
Calcium: 9.2 mg/dL (ref 8.9–10.3)
Chloride: 104 mmol/L (ref 101–111)
Creatinine, Ser: 0.76 mg/dL (ref 0.44–1.00)
GFR calc Af Amer: >60 mL/min (ref >60)
GFR calc non Af Amer: >60 mL/min (ref >60)
Glucose, Bld: 99 mg/dL (ref 65–99)
Potassium: 3.8 mmol/L (ref 3.5–5.1)
Sodium: 139 mmol/L (ref 135–145)

## 2016-05-23 LAB — URINE MICROSCOPIC-ADD ON

## 2016-05-23 LAB — I-STAT TROPONIN, ED: Troponin i, poc: 0 ng/mL (ref 0.00–0.08)

## 2016-05-23 MED ORDER — MECLIZINE HCL 25 MG PO TABS: 25.0000 mg | ORAL_TABLET | Freq: Three times a day (TID) | ORAL | Status: DC | PRN

## 2016-05-23 NOTE — ED Notes (Addendum)
Pt c/o increasing dizziness, nausea, and central chest pressure x 5 days.  Pain score 3/10.  Pt has been seen by PCP for same x 3 days ago.  Sts all labs were negative.      Pt reports appointment w/ Cardiology on 7/24.

## 2016-05-23 NOTE — Discharge Instructions (Signed)
Dizziness Dizziness is a common problem. It is a feeling of unsteadiness or light-headedness. You may feel like you are about to faint. Dizziness can lead to injury if you stumble or fall. Anyone can become dizzy, but dizziness is more common in older adults. This condition can be caused by a number of things, including medicines, dehydration, or illness. HOME CARE INSTRUCTIONS Taking these steps may help with your condition: Eating and Drinking  Drink enough fluid to keep your urine clear or pale yellow. This helps to keep you from becoming dehydrated. Try to drink more clear fluids, such as water.  Do not drink alcohol.  Limit your caffeine intake if directed by your health care provider.  Limit your salt intake if directed by your health care provider. Activity  Avoid making quick movements.  Rise slowly from chairs and steady yourself until you feel okay.  In the morning, first sit up on the side of the bed. When you feel okay, stand slowly while you hold onto something until you know that your balance is fine.  Move your legs often if you need to stand in one place for a long time. Tighten and relax your muscles in your legs while you are standing.  Do not drive or operate heavy machinery if you feel dizzy.  Avoid bending down if you feel dizzy. Place items in your home so that they are easy for you to reach without leaning over. Lifestyle  Do not use any tobacco products, including cigarettes, chewing tobacco, or electronic cigarettes. If you need help quitting, ask your health care provider.  Try to reduce your stress level, such as with yoga or meditation. Talk with your health care provider if you need help. General Instructions  Watch your dizziness for any changes.  Take medicines only as directed by your health care provider. Talk with your health care provider if you think that your dizziness is caused by a medicine that you are taking.  Tell a friend or a family  member that you are feeling dizzy. If he or she notices any changes in your behavior, have this person call your health care provider.  Keep all follow-up visits as directed by your health care provider. This is important. SEEK MEDICAL CARE IF:  Your dizziness does not go away.  Your dizziness or light-headedness gets worse.  You feel nauseous.  You have reduced hearing.  You have new symptoms.  You are unsteady on your feet or you feel like the room is spinning. SEEK IMMEDIATE MEDICAL CARE IF:  You vomit or have diarrhea and are unable to eat or drink anything.  You have problems talking, walking, swallowing, or using your arms, hands, or legs.  You feel generally weak.  You are not thinking clearly or you have trouble forming sentences. It may take a friend or family member to notice this.  You have chest pain, abdominal pain, shortness of breath, or sweating.  Your vision changes.  You notice any bleeding.  You have a headache.  You have neck pain or a stiff neck.  You have a fever.   This information is not intended to replace advice given to you by your health care provider. Make sure you discuss any questions you have with your health care provider.   Document Released: 04/30/2001 Document Revised: 03/21/2015 Document Reviewed: 10/31/2014 Elsevier Interactive Patient Education 2016 [employer] SUSPECTED Benign Positional Vertigo Vertigo is the feeling that you or your surroundings are moving when they are not.  Benign positional vertigo is the most common form of vertigo. The cause of this condition is not serious (is benign). This condition is triggered by certain movements and positions (is positional). This condition can be dangerous if it occurs while you are doing something that could endanger you or others, such as driving.  CAUSES In many cases, the cause of this condition is not known. It may be caused by a disturbance in an area of the inner ear that  helps your brain to sense movement and balance. This disturbance can be caused by a viral infection (labyrinthitis), head injury, or repetitive motion. RISK FACTORS This condition is more likely to develop in:  Women.  People who are 79 years of age or older. SYMPTOMS Symptoms of this condition usually happen when you move your head or your eyes in different directions. Symptoms may start suddenly, and they usually last for less than a minute. Symptoms may include:  Loss of balance and falling.  Feeling like you are spinning or moving.  Feeling like your surroundings are spinning or moving.  Nausea and vomiting.  Blurred vision.  Dizziness.  Involuntary eye movement (nystagmus). Symptoms can be mild and cause only slight annoyance, or they can be severe and interfere with daily life. Episodes of benign positional vertigo may return (recur) over time, and they may be triggered by certain movements. Symptoms may improve over time. DIAGNOSIS This condition is usually diagnosed by medical history and a physical exam of the head, neck, and ears. You may be referred to a health care provider who specializes in ear, nose, and throat (ENT) problems (otolaryngologist) or a provider who specializes in disorders of the nervous system (neurologist). You may have additional testing, including:  MRI.  A CT scan.  Eye movement tests. Your health care provider may ask you to change positions quickly while he or she watches you for symptoms of benign positional vertigo, such as nystagmus. Eye movement may be tested with an electronystagmogram (ENG), caloric stimulation, the Dix-Hallpike test, or the roll test.  An electroencephalogram (EEG). This records electrical activity in your brain.  Hearing tests. TREATMENT Usually, your health care provider will treat this by moving your head in specific positions to adjust your inner ear back to normal. Surgery may be needed in severe cases, but this is  rare. In some cases, benign positional vertigo may resolve on its own in 2-4 weeks. HOME CARE INSTRUCTIONS Safety  Move slowly.Avoid sudden body or head movements.  Avoid driving.  Avoid operating heavy machinery.  Avoid doing any tasks that would be dangerous to you or others if a vertigo episode would occur.  If you have trouble walking or keeping your balance, try using a cane for stability. If you feel dizzy or unstable, sit down right away.  Return to your normal activities as told by your health care provider. Ask your health care provider what activities are safe for you. General Instructions  Take over-the-counter and prescription medicines only as told by your health care provider.  Avoid certain positions or movements as told by your health care provider.  Drink enough fluid to keep your urine clear or pale yellow.  Keep all follow-up visits as told by your health care provider. This is important. SEEK MEDICAL CARE IF:  You have a fever.  Your condition gets worse or you develop new symptoms.  Your family or friends notice any behavioral changes.  Your nausea or vomiting gets worse.  You have numbness or a "  pins and needles" sensation. °SEEK IMMEDIATE MEDICAL CARE IF: °· You have difficulty speaking or moving. °· You are always dizzy. °· You faint. °· You develop severe headaches. °· You have weakness in your legs or arms. °· You have changes in your hearing or vision. °· You develop a stiff neck. °· You develop sensitivity to light. °  °This information is not intended to replace advice given to you by your health care provider. Make sure you discuss any questions you have with your health care provider. °  °Document Released: 08/12/2006 Document Revised: 07/26/2015 Document Reviewed: 02/27/2015 °Elsevier Interactive Patient Education ©2016 Elsevier Inc. ° °

## 2016-05-23 NOTE — Progress Notes (Signed)
Patient listed a shaving Medicare insurance without a pcp.  EDCm spoke to patient at bedside.  Patient confirms her pcp is Dr. [place]Andrey Campanile in [place].  System updated.

## 2016-05-23 NOTE — ED Provider Notes (Signed)
CSN: [id number]     Arrival date & time 05/23/16  1352 History   First MD Initiated Contact with Patient 05/23/16 1555     Chief Complaint  Patient presents with  . Dizziness  . Chest Pain     (Consider location/radiation/quality/duration/timing/severity/associated sxs/prior Treatment) HPI Patient has been having episodic dizziness. Coming and going for 5 days. 5 days ago she's had several episodes of that were more severe. No associated chest pain at that time. Then on Sunday she felt very dizzy sitting upright in the car and had to lie back. Patient has been able to take several walks. She typically walks several miles per day with her husband. She walks one to 2 miles at a time and has not been experiencing any chest pain, exertional dyspnea or lightheadedness with activity. Symptoms got much worse yesterday evening at about 4 in the morning when she tried to go the bathroom. She reports on the way back she felt very dizzy. She describes the dizziness more as a lightheadedness sensation and feeling like she might pass out. The patient however has had no loss of consciousness. No focal weakness numbness or take. No visual changes or loss. Occasional nausea. Yesterday she started developing intermittent chest pressure without radiation. It does not associate with dizziness at all times. Also does not associate with exertional dyspnea. No lower extremity swelling or calf tenderness. Patient have instructed by her primary care physician to increase her left electrolyte intake. She reports for a couple of days that did seem helpful. No recent fever chills, vomiting, diarrhea, urinary symptoms, rash. Patient reports several days ago she did have a headache that she qualifies as being mild. She took some ibuprofen and it resolved. Past Medical History  Diagnosis Date  . Complication of anesthesia     cannot have pentothol  . Arthritis   . Chronic diarrhea   . No pertinent past medical history   .  Vertigo   . Sciatica   . High cholesterol   . Seborrhea   . Clostridium difficile infection    Past Surgical History  Procedure Laterality Date  . Appendectomy  1962  . Hemorroidectomy  1999  . Wisdom tooth extraction    . Carpal tunnel release  10/12    rt   . Ear biopsy      growth lt ear  . Colonoscopy    . Carpal tunnel release  02/25/2012    Procedure: CARPAL TUNNEL RELEASE;  Surgeon: Nicki Reaper, MD;  Location: [facility];  Service: Orthopedics;  Laterality: Left;  . Cataract extraction    . Lumbar disc surgery     History reviewed. No pertinent family history. Social History  Substance Use Topics  . Smoking status: Never Smoker   . Smokeless tobacco: None  . Alcohol Use: No   OB History    No data available     Review of Systems  10 Systems reviewed and are negative for acute change except as noted in the HPI.   Allergies  Ciprofloxacin; Paroxetine; Prednisone; Prochlorperazine edisylate; Promethazine hcl; Risedronate sodium; and Sodium pentobarbital  Home Medications   Prior to Admission medications   Medication Sig Start Date End Date Taking? Authorizing Provider  calcium-vitamin D (OSCAL WITH D) 500-200 MG-UNIT per tablet Take 1 tablet by mouth daily.    Historical Provider, MD  loperamide (IMODIUM) 2 MG capsule Take 2 mg by mouth 4 (four) times daily as needed.    Historical Provider, MD  meclizine (ANTIVERT) 25 MG tablet Take 1 tablet (25 mg total) by mouth 3 (three) times daily as needed for dizziness. 05/23/16   Arby BarretteMarcy Keyanna Sandefer, MD  saccharomyces boulardii (FLORASTOR) 250 MG capsule Take 250 mg by mouth 2 (two) times daily.    Historical Provider, MD   BP 125/84 mmHg  Pulse 79  Temp(Src) 98.1 F (36.7 C) (Oral)  Resp 18  Ht 5\' 2"  (1.575 m)  Wt 126 lb (57.153 kg)  BMI 23.04 kg/m2  SpO2 100% Physical Exam  Constitutional: She is oriented to person, place, and time. She appears well-developed and well-nourished.  HENT:  Head:  Normocephalic and atraumatic.  Left ear had small brown, desiccated material. Examined under magnification, suspicious for a desiccated tic. Ear canals were otherwise completely clear. Normal TMs bilaterally. Oral cavity with good dentition. Posterior or first widely patent without erythema. Neck is supple.  Eyes: EOM are normal. Pupils are equal, round, and reactive to light.  Neck: Neck supple.  Cardiovascular: Normal rate, regular rhythm, normal heart sounds and intact distal pulses.   Pulmonary/Chest: Effort normal and breath sounds normal.  Abdominal: Soft. Bowel sounds are normal. She exhibits no distension. There is no tenderness.  Musculoskeletal: Normal range of motion. She exhibits no edema.  Neurological: She is alert and oriented to person, place, and time. She has normal strength. No cranial nerve deficit. She exhibits normal muscle tone. Coordination normal. GCS eye subscore is 4. GCS verbal subscore is 5. GCS motor subscore is 6.  Normal finger-nose examination bilaterally. No pronator drift. Sensation intact to light touch. Speech is clear without any cognitive deficit.  Skin: Skin is warm, dry and intact.  Psychiatric: She has a normal mood and affect.    ED Course  Procedures (including critical care time) Labs Review Labs Reviewed  URINALYSIS, ROUTINE W REFLEX MICROSCOPIC (NOT AT [facility]) - Abnormal; Notable for the following:    Leukocytes, UA TRACE (*)    All other components within normal limits  URINE MICROSCOPIC-ADD ON - Abnormal; Notable for the following:    Squamous Epithelial / LPF 0-5 (*)    Bacteria, UA RARE (*)    All other components within normal limits  URINE CULTURE  BASIC METABOLIC PANEL  CBC  I-STAT TROPOININ, ED    Imaging Review Dg Chest 2 View  05/23/2016  CLINICAL DATA:  Chest pressure for several hours, initial encounter EXAM: CHEST  2 VIEW COMPARISON:  08/22/2011 FINDINGS: The heart size and mediastinal contours are within normal limits. Both  lungs are clear. The visualized skeletal structures are unremarkable. IMPRESSION: No active cardiopulmonary disease. Electronically Signed   By: Alcide CleverMark  Lukens M.D.   On: 05/23/2016 15:34   I have personally reviewed and evaluated these images and lab results as part of my medical decision-making.   EKG Interpretation   Date/Time:  Thursday May 23 2016 14:33:34 EDT Ventricular Rate:  74 PR Interval:    QRS Duration: 94 QT Interval:  401 QTC Calculation: 445 R Axis:   -67 Text Interpretation:  Sinus rhythm Multiple ventricular premature  complexes Left atrial enlargement Left anterior fascicular block Abnormal  R-wave progression, early transition ST elevation, consider inferior  injury Baseline wander in lead(s) V5 no sig change from old. Confirmed by  Donnald GarrePfeiffer, MD, Lebron ConnersMarcy [phone]([id number]) on 05/23/2016 3:59:39 PM      MDM   Final diagnoses:  Dizziness of unknown cause  Chest pain, unspecified chest pain type   Patient is having episodic dizziness that appears of low probability  to be cardiac ischemic. Patient is most symptomatic at rest or with position change. She exercises regularly by taking long walks. She is not experiencing symptoms that are occurring or exacerbated by physical exertion. No ischemic type symptoms such as dyspnea, diaphoresis, nausea or lightheadedness with exertion. Patient's most severe episode occurred while riding in the car and needing to lie flat to improve her symptoms and also after getting up in the middle the night to use the bathroom. The patient has not been ill recently there is been no vomiting no diarrhea (aside from mild chronic, loose stool) no fevers no chills. She described mild headache several days ago. No headache that would be suggestive of emergent neurologic etiology. No focal neurologic deficits such as visual changes instability focal weakness numbness or tingling. Patient had incidental FB that appeared very suspicious for a desiccated tick. She  however has had no symptoms to suggest  zoonotic infection, no auditory canal inflammation and no neurologic deficit. Although patient does not endorse spinning quality, triggers for dizziness and acute severity that waxes and wanes without associated symptoms sounds most suggestive of positional vertigo. Patient counseled on side effects of meclize and that it is for symptomatic treatment only. Prescribed to use PRN with advised PCP follow up.    Arby BarretteMarcy Shalini Mair, MD 05/23/16 [phone]

## 2016-05-24 LAB — URINE CULTURE

## 2016-06-20 ENCOUNTER — Ambulatory Visit (INDEPENDENT_AMBULATORY_CARE_PROVIDER_SITE_OTHER): Payer: Medicare Other | Admitting: Neurology

## 2016-06-20 ENCOUNTER — Encounter: Payer: Self-pay | Admitting: Neurology

## 2016-06-20 VITALS — BP 151/84 | HR 78 | Resp 14 | Ht 62.5 in | Wt 126.0 lb

## 2016-06-20 DIAGNOSIS — Z789 Other specified health status: Secondary | ICD-10-CM

## 2016-06-20 DIAGNOSIS — H9313 Tinnitus, bilateral: Secondary | ICD-10-CM | POA: Diagnosis not present

## 2016-06-20 DIAGNOSIS — R42 Dizziness and giddiness: Secondary | ICD-10-CM | POA: Diagnosis not present

## 2016-06-20 NOTE — Patient Instructions (Signed)
We will do a brain scan, called MRI and call you with the test results. We will have to schedule you for this on a separate date. This test requires authorization from your insurance, and we will take care of the insurance process.  We will do an EEG (brainwave test), which we will schedule. We will call you with the results.  Please stay well hydrated with water and continue to exercise regularly.   Your neurological exam is non-focal, which is reassuring.   If both your tests are reassuring, we can play it by ear. You may discuss with Dr. Andrey Campanile the need to see an ear, nose and throat specialist and/or cardiologist.

## 2016-06-20 NOTE — Progress Notes (Signed)
Subjective:    Patient ID: Shelby Arellano is a 81 y.o. female.  HPI     Huston Foley, MD, PhD [facility] [address] [address] [place], [place] [address]  Dear Dr. Andrey Campanile,   I saw your patient, Shelby Arellano, upon your kind request, in my neurologic clinic today for initial consultation of her her dizziness, which started on or around 05/18/16. The patient is accompanied by her husband today. As you know, Shelby Arellano is a 81 year old right-handed woman with an underlying medical history of C. difficile colitis in the past, chronic diarrhea, tinnitus, hyperlipidemia, osteoporosis, carpal tunnel surgery, multiple medication intolerances and adverse reactions to medications, who reports intermittent dizziness, particularly when standing up quickly, mostly in keeping with lightheadedness, some vertiginous component at times. She has a chronic history of tendinitis of over 18 years duration. She has tinnitus bilaterally. She has no evidence of hearing loss. She had intermittent palpitations and chest heaviness. She went with recurrent dizziness symptoms to the emergency room on 05/23/2016 and I reviewed the emergency room records. Ear examination revealed some desiccated material or foreign body, perhaps a take, patient had no signs of infection, no rash, no cerumen impaction. She feels a little better overall, she was given meclizine in the emergency room for as needed use and feels that it has been somewhat helpful. She does report that it is sedating. She is trying to stay better hydrated. She usually walks regularly. She also brings in a 2 page writ up about her medical history and medication intolerances, as well as concerns or problems, which I reviewed as well. She is a nonsmoker. She has 4 grown children. She does not drink alcohol or use illicit drugs, caffeine occasionally in the form of coffee. She does not take very many medications at this time, probiotic,  calcium, Imodium as needed for chronic diarrhea.  Her Past Medical History Is Significant For: Past Medical History:  Diagnosis Date  . Arthritis   . Chronic diarrhea   . Clostridium difficile infection   . Complication of anesthesia    cannot have pentothol  . High cholesterol   . No pertinent past medical history   . Sciatica   . Seborrhea   . Vertigo     Her Past Surgical History Is Significant For: Past Surgical History:  Procedure Laterality Date  . APPENDECTOMY  1962  . CARPAL TUNNEL RELEASE  10/12   rt   . CARPAL TUNNEL RELEASE  02/25/2012   Procedure: CARPAL TUNNEL RELEASE;  Surgeon: Nicki Reaper, MD;  Location: [facility];  Service: Orthopedics;  Laterality: Left;  . CATARACT EXTRACTION    . COLONOSCOPY    . EAR BIOPSY     growth lt ear  . HEMORROIDECTOMY  1999  . LUMBAR DISC SURGERY    . WISDOM TOOTH EXTRACTION      Her Family History Is Significant For: Family History  Problem Relation Age of Onset  . Heart disease Father     Her Social History Is Significant For: Social History   Social History  . Marital status: Married    Spouse name: N/A  . Number of children: 4  . Years of education: BA   Occupational History  . Retired     Social History Main Topics  . Smoking status: Never Smoker  . Smokeless tobacco: Not on file  . Alcohol use No  . Drug use: No  . Sexual activity: Not on file  Other Topics Concern  . Not on file   Social History Narrative   Drinks coffee occasionally     Her Allergies Are:  Allergies  Allergen Reactions  . Ciprofloxacin   . Compazine [Prochlorperazine Edisylate] Other (See Comments)    Confusion   . Fentanyl Other (See Comments)    Fatigue, disorientation, trouble waking   . Hydrocodone Other (See Comments)    confusion  . Paroxetine   . Prednisone     REACTION: hyper  . Prochlorperazine Edisylate     REACTION: dizziness  . Promethazine Hcl     REACTION: hallucination  . Risedronate  Sodium   . Sodium Pentobarbital [Pentobarbital Sodium]   . Thiopental Other (See Comments)  . Trazodone And Nefazodone Other (See Comments)    dizziness  . Zolpidem Other (See Comments)  :   Her Current Medications Are:  Outpatient Encounter Prescriptions as of 06/20/2016  Medication Sig  . calcium-vitamin D (OSCAL WITH D) 500-200 MG-UNIT per tablet Take 1 tablet by mouth daily.  Marland Kitchen loperamide (IMODIUM) 2 MG capsule Take 2 mg by mouth 4 (four) times daily as needed.  . Probiotic Product (PROBIOTIC DAILY PO) Take by mouth.  . saccharomyces boulardii (FLORASTOR) 250 MG capsule Take 250 mg by mouth 2 (two) times daily.  . [DISCONTINUED] meclizine (ANTIVERT) 25 MG tablet Take 1 tablet (25 mg total) by mouth 3 (three) times daily as needed for dizziness.   No facility-administered encounter medications on file as of 06/20/2016.   :   Review of Systems:  Out of a complete 14 point review of systems, all are reviewed and negative with the exception of these symptoms as listed below:   Review of Systems  Neurological:       Patient reports that she has had off and on dizziness for about a month.     Objective:  Neurologic Exam  Physical Exam Physical Examination:   Vitals:   06/20/16 0901 06/20/16 0909  BP: (!) 168/88 (!) 151/84  Pulse: 69 78  Resp: 14    She had mild orthostatic dizziness, no vertigo, no significant orthostatic blood pressure or pulse changes. Blood pressure overall borderline but she admits that she typically has mild elevation in her blood pressure when going to the doctor's office.  General Examination: The patient is a very pleasant 81 y.o. female in no acute distress. She appears well-developed and well-nourished and well groomed.   HEENT: Normocephalic, atraumatic, pupils are equal, round and reactive to light and accommodation. Funduscopic exam is normal with sharp disc margins noted. Extraocular tracking is good without limitation to gaze excursion or  nystagmus noted. Normal smooth pursuit is noted. Hearing is grossly intact. Tympanic membranes are clear bilaterally. Face is symmetric with normal facial animation and normal facial sensation. Speech is clear with no dysarthria noted. There is no hypophonia. There is no lip, neck/head, jaw or voice tremor. Neck is supple with full range of passive and active motion. There are no carotid bruits on auscultation. Oropharynx exam reveals: mild mouth dryness, adequate dental hygiene and mild airway crowding, due to smaller airway. Mallampati is class II. Tongue protrudes centrally and palate elevates symmetrically.   Chest: Clear to auscultation without wheezing, rhonchi or crackles noted.  Heart: S1+S2+0, regular and normal without murmurs, rubs or gallops noted.   Abdomen: Soft, non-tender and non-distended with normal bowel sounds appreciated on auscultation.  Extremities: There is no pitting edema in the distal lower extremities bilaterally. Pedal pulses are intact.  Skin:  Warm and dry without trophic changes noted. There are no varicose veins.  Musculoskeletal: exam reveals no obvious joint deformities, tenderness or joint swelling or erythema.   Neurologically:  Mental status: The patient is awake, alert and oriented in all 4 spheres. Her immediate and remote memory, attention, language skills and fund of knowledge are appropriate. There is no evidence of aphasia, agnosia, apraxia or anomia. Speech is clear with normal prosody and enunciation. Thought process is linear. Mood is normal and affect is normal.  Cranial nerves II - XII are as described above under HEENT exam. In addition: shoulder shrug is normal with equal shoulder height noted. Motor exam: Normal bulk, strength and tone is noted. There is no drift, tremor or rebound. Romberg is negative. Reflexes are 2+ throughout. Fine motor skills and coordination: intact with normal finger taps, normal hand movements, normal rapid alternating  patting, normal foot taps and normal foot agility.  Cerebellar testing: No dysmetria or intention tremor on finger to nose testing. Heel to shin is unremarkable bilaterally. There is no truncal or gait ataxia.  Sensory exam: intact to light touch, pinprick, vibration, temperature sense in the upper and lower extremities.  Gait, station and balance: She stands easily. No veering to one side is noted. No leaning to one side is noted. Posture is age-appropriate and stance is narrow based. Gait shows normal stride length and pace. No problems turning are noted. Tandem walk is slightly challenging for age.               Assessment and plan:   In summary, Shelby Arellano is a very pleasant 81 y.o.-year old female with an underlying medical history of C. difficile colitis in the past, chronic diarrhea, tinnitus, hyperlipidemia, osteoporosis, carpal tunnel surgery, multiple medication intolerances and adverse reactions to medications, who Reports a one-month history of intermittent dizziness. Symptoms have improved mildly in the last week. She has been taking meclizine as needed with modest results but has not had to take it in the past week. Neurological exam is completely nonfocal and she is reassured in that regard. She mentioned perhaps seeing a cardiologist and that was discussed previously. She is encouraged to discuss the possibility of seeing cardiology and/or ENT. She reports a several year history of bilateral tinnitus. For completion, I would like to recommend an EEG and brain MRI we will schedule her for these tests. We will call her with her test results, if reassuring results, she can follow-up on an as-needed basis. I asked her to stay well-hydrated and well rested and exercise in moderation on a regular basis, not to overheat, not to overexert. As far as driving, she is encouraged to discuss with you if she should be driving, there is no neurological reason for her not to drive.  I answered all their  questions today and the patient and her husband were in agreement.  Thank you very much for allowing me to participate in the care of this nice patient. If I can be of any further assistance to you please do not hesitate to call me at [phone].  Sincerely,   Huston Foley, MD, PhD

## 2016-07-01 ENCOUNTER — Ambulatory Visit (INDEPENDENT_AMBULATORY_CARE_PROVIDER_SITE_OTHER): Payer: Medicare Other | Admitting: Diagnostic Neuroimaging

## 2016-07-01 DIAGNOSIS — R55 Syncope and collapse: Secondary | ICD-10-CM

## 2016-07-01 DIAGNOSIS — Z789 Other specified health status: Secondary | ICD-10-CM

## 2016-07-01 DIAGNOSIS — H9313 Tinnitus, bilateral: Secondary | ICD-10-CM

## 2016-07-01 DIAGNOSIS — R42 Dizziness and giddiness: Secondary | ICD-10-CM

## 2016-07-02 ENCOUNTER — Ambulatory Visit
Admission: RE | Admit: 2016-07-02 | Discharge: 2016-07-02 | Disposition: A | Discharge status: Home / Self Care | Payer: Medicare Other | Source: Ambulatory Visit | Attending: Neurology | Admitting: Neurology

## 2016-07-02 DIAGNOSIS — R42 Dizziness and giddiness: Secondary | ICD-10-CM

## 2016-07-02 DIAGNOSIS — H9313 Tinnitus, bilateral: Secondary | ICD-10-CM

## 2016-07-02 DIAGNOSIS — Z789 Other specified health status: Secondary | ICD-10-CM

## 2016-07-05 NOTE — Progress Notes (Signed)
Please call patient regarding the recent brain MRI: The brain scan showed a normal structure of the brain and mild volume loss which we call atrophy. There were changes in the deeper structures of the brain, which we call white matter changes or microvascular changes. These were reported as mild in Her case. These are tiny white spots, that occur with time and are seen in a variety of conditions, including with normal aging, chronic hypertension, chronic headaches, especially migraine HAs, chronic diabetes, chronic hyperlipidemia. These are not strokes and no mass or lesion were seen which is reassuring.  There was evidence of mild chronic sinus disease, but not acute.  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 This scan was not dedicated to the neck and beyond the third or fourth cervical vertebrae the neck was not fully visualized but there was significant degenerative change noted especially at the level of C2-C4. If she has any neck related pain or issues, she is advised to talk to her primary care physician about seeing a spine specialist, particularly a neurosurgeon or an orthopedic surgeon.  Again,  as far as the brain scan is concerned, there were no acute findings, such as a stroke, or mass or blood products. No further action is required on this test at this time, other than re-enforcing the importance of good blood pressure control, good cholesterol control, good blood sugar control, and weight management. Please remind patient to keep any upcoming appointments or tests and to call us with any interim questions, concerns, problems or updates. Thanks,  Huston FoleySaima Imonie Tuch, MD, PhD

## 2016-07-08 ENCOUNTER — Telehealth: Payer: Self-pay

## 2016-07-08 NOTE — Telephone Encounter (Signed)
Called home phone no answer and no vm. Called cell and left vm.

## 2016-07-08 NOTE — Telephone Encounter (Signed)
-----   Message from Huston FoleySaima Athar, MD sent at 07/05/2016 11:52 AM EDT ----- Please call patient regarding the recent brain MRI: The brain scan showed a normal structure of the brain and mild volume loss which we call atrophy. There were changes in the deeper structures of the brain, which we call white matter changes or microvascular changes. These were reported as mild in Her case. These are tiny white spots, that occur with time and are seen in a variety of conditions, including with normal aging, chronic hypertension, chronic headaches, especially migraine HAs, chronic diabetes, chronic hyperlipidemia. These are not strokes and no mass or lesion were seen which is reassuring.  There was evidence of mild chronic sinus disease, but not acute.  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 This scan was not dedicated to the neck and beyond the third or fourth cervical vertebrae the neck was not fully visualized but there was significant degenerative change noted especially at the level of C2-C4. If she has any neck related pain or issues, she is advised to talk to her primary care physician about seeing a spine specialist, particularly a neurosurgeon or an orthopedic surgeon.  Again,  as far as the brain scan is concerned, there were no acute findings, such as a stroke, or mass or blood products. No further action is required on this test at this time, other than re-enforcing the importance of good blood pressure control, good cholesterol control, good blood sugar control, and weight management. Please remind patient to keep any upcoming appointments or tests and to call us with any interim questions, concerns, problems or updates. Thanks,  Huston FoleySaima Athar, MD, PhD

## 2016-07-17 NOTE — Telephone Encounter (Signed)
Called home 2x and no answer and no vm. Called cell phone and LM asking for patient to call back for results.

## 2016-07-19 NOTE — Progress Notes (Signed)
Please call and advise the patient that the EEG or brain wave test we performed was reported as normal in the awake and asleep states. We checked for abnormal electrical discharges in the brain waves and the report suggested normal findings. No further action is required on this test at this time. Please remind patient to keep any upcoming appointments or tests and to call us with any interim questions, concerns, problems or updates. Thanks,  Tysha Grismore, MD, PhD  

## 2016-07-19 NOTE — Procedures (Signed)
   [facility]  EEG (ELECTROENCEPHALOGRAM) REPORT   STUDY DATE: 07/01/16 PATIENT NAME: Shelby Arellano DOB: 19-Dec-1934 MRN: [id number]  ORDERING CLINICIAN: Huston FoleySaima Athar, MD PhD   TECHNOLOGIST: Gearldine ShownLorraine Jones  TECHNIQUE: Electroencephalogram was recorded utilizing standard 10-20 system of lead placement and reformatted into average and bipolar montages.  RECORDING TIME: 41 minutes  ACTIVATION: hyperventilation and photic stimulation  CLINICAL INFORMATION: 81 year old female with intermittent dizziness.  FINDINGS: Background rhythms of 8-9 hertz and 30-40 microvolts. No focal, lateralizing, epileptiform activity or seizures are seen. Patient recorded in the awake, drowsy and asleep state. EKG channel shows regular rhythm of 70-80 beats per minute.  IMPRESSION:  Normal EEG in the awake and asleep states.    INTERPRETING PHYSICIAN:  Suanne MarkerVIKRAM R. PENUMALLI, MD Certified in Neurology, Neurophysiology and Neuroimaging  [facility] [address], [address] [place], [place] [address] [phone]

## 2016-07-23 NOTE — Telephone Encounter (Signed)
Patient also denies any symptoms in neck or arms.

## 2016-07-23 NOTE — Telephone Encounter (Signed)
Pt returned Diana's call. Please call her back at [phone] (h)

## 2016-07-23 NOTE — Telephone Encounter (Signed)
I spoke to patient and gave results of EEG and MRI. She voiced understanding. I sent copy of report to PCP, patient has upcoming appt with him.

## 2016-10-22 ENCOUNTER — Emergency Department (HOSPITAL_COMMUNITY): Payer: Medicare Other

## 2016-10-22 ENCOUNTER — Emergency Department (HOSPITAL_COMMUNITY)
Admission: EM | Admit: 2016-10-22 | Discharge: 2016-10-22 | Disposition: A | Discharge status: Home / Self Care | Payer: Medicare Other | Attending: Emergency Medicine | Admitting: Emergency Medicine

## 2016-10-22 ENCOUNTER — Encounter (HOSPITAL_COMMUNITY): Payer: Self-pay

## 2016-10-22 DIAGNOSIS — R0789 Other chest pain: Secondary | ICD-10-CM | POA: Diagnosis not present

## 2016-10-22 DIAGNOSIS — Z79899 Other long term (current) drug therapy: Secondary | ICD-10-CM | POA: Insufficient documentation

## 2016-10-22 DIAGNOSIS — R42 Dizziness and giddiness: Secondary | ICD-10-CM

## 2016-10-22 LAB — CBC WITH DIFFERENTIAL/PLATELET
Basophils Absolute: 0 10*3/uL (ref 0.0–0.1)
Basophils Relative: 0 %
Eosinophils Absolute: 0.1 10*3/uL (ref 0.0–0.7)
Eosinophils Relative: 2 %
HCT: 42.3 % (ref 36.0–46.0)
Hemoglobin: 14 g/dL (ref 12.0–15.0)
Lymphocytes Relative: 35 %
Lymphs Abs: 1.9 10*3/uL (ref 0.7–4.0)
MCH: 31.5 pg (ref 26.0–34.0)
MCHC: 33.1 g/dL (ref 30.0–36.0)
MCV: 95.1 fL (ref 78.0–100.0)
Monocytes Absolute: 0.6 10*3/uL (ref 0.1–1.0)
Monocytes Relative: 12 %
Neutro Abs: 2.7 10*3/uL (ref 1.7–7.7)
Neutrophils Relative %: 51 %
Platelets: 211 10*3/uL (ref 150–400)
RBC: 4.45 MIL/uL (ref 3.87–5.11)
RDW: 14.2 % (ref 11.5–15.5)
WBC: 5.3 10*3/uL (ref 4.0–10.5)

## 2016-10-22 LAB — COMPREHENSIVE METABOLIC PANEL
ALT: 16 U/L (ref 14–54)
AST: 30 U/L (ref 15–41)
Albumin: 3.4 g/dL — ABNORMAL LOW (ref 3.5–5.0)
Alkaline Phosphatase: 66 U/L (ref 38–126)
Anion gap: 8 (ref 5–15)
BUN: 10 mg/dL (ref 6–20)
CO2: 25 mmol/L (ref 22–32)
Calcium: 9 mg/dL (ref 8.9–10.3)
Chloride: 106 mmol/L (ref 101–111)
Creatinine, Ser: 0.89 mg/dL (ref 0.44–1.00)
GFR calc Af Amer: >60 mL/min (ref >60)
GFR calc non Af Amer: >60 mL/min (ref >60)
Glucose, Bld: 92 mg/dL (ref 65–99)
Potassium: 3.9 mmol/L (ref 3.5–5.1)
Sodium: 139 mmol/L (ref 135–145)
Total Bilirubin: 0.3 mg/dL (ref 0.3–1.2)
Total Protein: 6.3 g/dL — ABNORMAL LOW (ref 6.5–8.1)

## 2016-10-22 LAB — TROPONIN I
Troponin I: <0.03 ng/mL (ref <0.03)
Troponin I: <0.03 ng/mL (ref <0.03)

## 2016-10-22 MED ORDER — MECLIZINE HCL 25 MG PO TABS
12.5000 mg | ORAL_TABLET | Freq: Once | ORAL | Status: AC
  Administered 2016-10-22: 12.5 mg via ORAL
  Filled 2016-10-22: qty 1

## 2016-10-22 NOTE — ED Triage Notes (Addendum)
Pt c/o dizziness, chest pain and nausea. X 30 minutes, now resolved. 325mg  Aspirin PTA.

## 2016-10-22 NOTE — ED Provider Notes (Signed)
[facility]-EMERGENCY DEPT Provider Note   CSN: [id number] Arrival date & time: 10/22/16  1608     History   Chief Complaint Chief Complaint  Patient presents with  . Chest Pain    HPI Shelby Arellano is a 81 y.o. female.  The history is provided by the patient. No language interpreter was used.  Chest Pain   This is a new problem. The current episode started 6 to 12 hours ago. The problem occurs constantly. The problem has been resolved. The pain is associated with rest. The pain is present in the lateral region. The patient is experiencing no pain. The pain does not radiate. Duration of episode(s) is 30 minutes. Associated symptoms include dizziness. She has tried nothing for the symptoms. The treatment provided no relief. Risk factors include being elderly.  Her past medical history is significant for hyperlipidemia and hypertension.  Procedure history is negative for cardiac catheterization.   Pt reports she had a pressure in her chest around 1:00pm which lasted 30 minutes.  Pt reports symptoms resolved.  Pt complains of dizziness. Pt reports she was diagnosed with vertigo in July and started on meclizine.  Pt has not taken.  Pt reports meclize helps.  Pt reports this is the same as every day.  No difference in dizziness.  Pt reports soreness in her right lower ribs and chest for 1 week.  Pt reports she pulled a muscle while bending over to pick up a bag one week ago.  Past Medical History:  Diagnosis Date  . Arthritis   . Chronic diarrhea   . Clostridium difficile infection   . Complication of anesthesia    cannot have pentothol  . High cholesterol   . No pertinent past medical history   . Sciatica   . Seborrhea   . Vertigo     Patient Active Problem List   Diagnosis Date Noted  . HYPERLIPIDEMIA-MIXED 04/03/2009  . PALPITATIONS 03/17/2009  . SHORTNESS OF BREATH 03/17/2009  . DIZZINESS 03/15/2009    Past Surgical History:  Procedure Laterality Date  . APPENDECTOMY  1962    . CARPAL TUNNEL RELEASE  10/12   rt   . CARPAL TUNNEL RELEASE  02/25/2012   Procedure: CARPAL TUNNEL RELEASE;  Surgeon: Nicki ReaperGary R Kuzma, MD;  Location: [facility];  Service: Orthopedics;  Laterality: Left;  . CATARACT EXTRACTION    . COLONOSCOPY    . EAR BIOPSY     growth lt ear  . HEMORROIDECTOMY  1999  . LUMBAR DISC SURGERY    . WISDOM TOOTH EXTRACTION      OB History    No data available       Home Medications    Prior to Admission medications   Medication Sig Start Date End Date Taking? Authorizing Provider  calcium-vitamin D (OSCAL WITH D) 500-200 MG-UNIT per tablet Take 1 tablet by mouth 2 (two) times daily.    Yes Historical Provider, MD  loperamide (IMODIUM) 2 MG capsule Take 2 mg by mouth 4 (four) times daily as needed.   Yes Historical Provider, MD  meclizine (ANTIVERT) 25 MG tablet Take 12.5 mg by mouth 2 (two) times daily as needed for dizziness. 07/02/16  Yes Historical Provider, MD  Probiotic Product (PROBIOTIC DAILY) CAPS Take 1 capsule by mouth daily.   Yes Historical Provider, MD  saccharomyces boulardii (FLORASTOR) 250 MG capsule Take 250 mg by mouth 2 (two) times daily.   Yes Historical Provider, MD    Family History Family  History  Problem Relation Age of Onset  . Heart disease Father     Social History Social History  Substance Use Topics  . Smoking status: Never Smoker  . Smokeless tobacco: Never Used  . Alcohol use No     Allergies   Actonel [risedronate]; Ciprofloxacin; Compazine [prochlorperazine edisylate]; Fentanyl; Hydrocodone; Meloxicam; Other; Paroxetine; Phenergan [promethazine]; Prednisone; Tramadol; and Trazodone and nefazodone   Review of Systems Review of Systems  Cardiovascular: Negative for chest pain.  Neurological: Positive for dizziness and light-headedness.  All other systems reviewed and are negative.    Physical Exam Updated Vital Signs BP 153/97   Pulse 79   Temp 98.2 F (36.8 C) (Oral)   Resp 17    Ht 5\' 3"  (1.6 m)   Wt 56.7 kg   SpO2 98%   BMI 22.14 kg/m   Physical Exam  Constitutional: She is oriented to person, place, and time. She appears well-developed and well-nourished.  HENT:  Head: Normocephalic.  Right Ear: External ear normal.  Eyes: EOM are normal.  Neck: Normal range of motion.  Cardiovascular: Normal rate and regular rhythm.   Pulmonary/Chest: Effort normal and breath sounds normal.  Abdominal: Soft. She exhibits no distension.  Musculoskeletal: Normal range of motion.  Neurological: She is alert and oriented to person, place, and time.  Skin: Skin is warm.  Psychiatric: She has a normal mood and affect.  Nursing note and vitals reviewed.    ED Treatments / Results  Labs (all labs ordered are listed, but only abnormal results are displayed) Labs Reviewed  COMPREHENSIVE METABOLIC PANEL - Abnormal; Notable for the following:       Result Value   Total Protein 6.3 (*)    Albumin 3.4 (*)    All other components within normal limits  CBC WITH DIFFERENTIAL/PLATELET  TROPONIN I  TROPONIN I    EKG  EKG Interpretation  Date/Time:  Tuesday October 22 2016 16:17:02 EST Ventricular Rate:  83 PR Interval:    QRS Duration: 99 QT Interval:  378 QTC Calculation: 447 R Axis:   -62 Text Interpretation:  Sinus rhythm Probable left atrial enlargement Left anterior fascicular block Low voltage, precordial leads Abnormal R-wave progression, early transition No STEMI Confirmed by Rush LandmarkEGELER MD, CHRISTOPHER [phone]) on 10/22/2016 4:35:20 PM       Radiology Dg Chest 2 View  Result Date: 10/22/2016 CLINICAL DATA:  81 y/o F; one week of cough and congestion with chest pressure and dizziness today. EXAM: CHEST  2 VIEW COMPARISON:  05/23/2016 chest radiograph. FINDINGS: Stable cardiac silhouette given projection and technique. Aortic atherosclerosis with arch calcification. Clear lungs. No pneumothorax or pleural effusion. Mild levocurvature of the thoracic spine.  IMPRESSION: No active cardiopulmonary disease. Electronically Signed   By: Mitzi HansenLance  Furusawa-Stratton M.D.   On: 10/22/2016 17:18    Procedures Procedures (including critical care time)  Medications Ordered in ED Medications  meclizine (ANTIVERT) tablet 12.5 mg (12.5 mg Oral Given 10/22/16 1932)     Initial Impression / Assessment and Plan / ED Course  I have reviewed the triage vital signs and the nursing notes.  Pertinent labs & imaging results that were available during my care of the patient were reviewed by me and considered in my medical decision making (see chart for details).  Clinical Course     Pt given antivert for dizziness.  EKG no acute abnormality.  Troponin negative x 2.  Heart Score is 3.    Final Clinical Impressions(s) / ED Diagnoses  Final diagnoses:  Chest pressure  Vertigo    New Prescriptions New Prescriptions   No medications on file  Pt advised to follow up her Physician for recheck in 2-3 days.  Take a baby aspirin everyday   Elson Areas, [place] 10/22/16 2122    Canary Brim Tegeler, MD 10/23/16 [phone]

## 2016-10-22 NOTE — Discharge Instructions (Signed)
See Dr. Andrey CampanileWilson for recheck in the next 2-3 days. Take your antivert as directed for dizziness.  Return if chest worsens or changes.

## 2016-10-22 NOTE — ED Notes (Signed)
Assisted pt to the bathroom in her room.  She tolerated well and needed very moderate assistance while walking.

## 2016-10-22 NOTE — ED Notes (Signed)
Patient transported to X-ray 

## 2018-05-17 ENCOUNTER — Encounter (HOSPITAL_COMMUNITY): Payer: Self-pay | Admitting: Emergency Medicine

## 2018-05-17 ENCOUNTER — Ambulatory Visit (HOSPITAL_COMMUNITY)
Admission: EM | Admit: 2018-05-17 | Discharge: 2018-05-17 | Disposition: A | Discharge status: Home / Self Care | Payer: Medicare Other | Attending: Family Medicine | Admitting: Family Medicine

## 2018-05-17 DIAGNOSIS — S80212A Abrasion, left knee, initial encounter: Secondary | ICD-10-CM

## 2018-05-17 DIAGNOSIS — S80211A Abrasion, right knee, initial encounter: Secondary | ICD-10-CM

## 2018-05-17 DIAGNOSIS — S01521A Laceration with foreign body of lip, initial encounter: Secondary | ICD-10-CM | POA: Diagnosis not present

## 2018-05-17 DIAGNOSIS — S40212A Abrasion of left shoulder, initial encounter: Secondary | ICD-10-CM | POA: Diagnosis not present

## 2018-05-17 DIAGNOSIS — W108XXA Fall (on) (from) other stairs and steps, initial encounter: Secondary | ICD-10-CM

## 2018-05-17 DIAGNOSIS — S0181XA Laceration without foreign body of other part of head, initial encounter: Secondary | ICD-10-CM

## 2018-05-17 DIAGNOSIS — Y92009 Unspecified place in unspecified non-institutional (private) residence as the place of occurrence of the external cause: Secondary | ICD-10-CM

## 2018-05-17 DIAGNOSIS — W19XXXA Unspecified fall, initial encounter: Secondary | ICD-10-CM

## 2018-05-17 MED ORDER — PENICILLIN V POTASSIUM 250 MG PO TABS: 250.0000 mg | ORAL_TABLET | Freq: Three times a day (TID) | ORAL | 0 refills | Status: DC

## 2018-05-17 NOTE — ED Provider Notes (Signed)
[facility]    CSN: [id number] Arrival date & time: 05/17/18  1356     History   Chief Complaint Chief Complaint  Patient presents with  . Fall  . Facial Laceration    HPI Shelby Arellano is a 83 y.o. female.   HPI  Patient is here for injuries sustained in a fall.  She was walking up steps into the home and caught her toe on one step falling forward.  Hit her face on the brick steps.  Abrasion on left shoulder.  Abrasions on both knees.  No pain except for her upper lip and front teeth.  Does not think her teeth are loose.  Tetanus is up-to-date.  No loss of consciousness.  Fall was witnessed by husband who is here with her.  Bleeding controlled with pressure.  Her balance, in general, is good for age with no other recent falls.  Past Medical History:  Diagnosis Date  . Arthritis   . Chronic diarrhea   . Clostridium difficile infection   . Complication of anesthesia    cannot have pentothol  . High cholesterol   . No pertinent past medical history   . Sciatica   . Seborrhea   . Vertigo     Patient Active Problem List   Diagnosis Date Noted  . HYPERLIPIDEMIA-MIXED 04/03/2009  . PALPITATIONS 03/17/2009  . SHORTNESS OF BREATH 03/17/2009  . DIZZINESS 03/15/2009    Past Surgical History:  Procedure Laterality Date  . APPENDECTOMY  1962  . CARPAL TUNNEL RELEASE  10/12   rt   . CARPAL TUNNEL RELEASE  02/25/2012   Procedure: CARPAL TUNNEL RELEASE;  Surgeon: Nicki Reaper, MD;  Location: [facility];  Service: Orthopedics;  Laterality: Left;  . CATARACT EXTRACTION    . COLONOSCOPY    . EAR BIOPSY     growth lt ear  . HEMORROIDECTOMY  1999  . LUMBAR DISC SURGERY    . WISDOM TOOTH EXTRACTION      OB History   None      Home Medications    Prior to Admission medications   Medication Sig Start Date End Date Taking? Authorizing Provider  calcium-vitamin D (OSCAL WITH D) 500-200 MG-UNIT per tablet Take 1 tablet by mouth 2 (two) times daily.      [provider]  loperamide (IMODIUM) 2 MG capsule Take 2 mg by mouth 4 (four) times daily as needed.    [provider]  meclizine (ANTIVERT) 25 MG tablet Take 12.5 mg by mouth 2 (two) times daily as needed for dizziness. 07/02/16   [provider]  penicillin v potassium (VEETID) 250 MG tablet Take 1 tablet (250 mg total) by mouth 3 (three) times daily. 05/17/18   Eustace Moore, MD  Probiotic Product (PROBIOTIC DAILY) CAPS Take 1 capsule by mouth daily.    [provider]  saccharomyces boulardii (FLORASTOR) 250 MG capsule Take 250 mg by mouth 2 (two) times daily.    [provider]    Family History Family History  Problem Relation Age of Onset  . Heart disease Father     Social History Social History   Tobacco Use  . Smoking status: Never Smoker  . Smokeless tobacco: Never Used  Substance Use Topics  . Alcohol use: No  . Drug use: No     Allergies   Actonel [risedronate]; Ciprofloxacin; Compazine [prochlorperazine edisylate]; Fentanyl; Hydrocodone; Meloxicam; Other; Paroxetine; Phenergan [promethazine]; Prednisone; Tramadol; and Trazodone and nefazodone  Review of Systems Review of Systems  Constitutional: Negative for chills and fever.  HENT: Positive for dental problem. Negative for ear pain and sore throat.   Eyes: Negative for pain and visual disturbance.  Respiratory: Negative for cough and shortness of breath.   Cardiovascular: Negative for chest pain and palpitations.  Gastrointestinal: Negative for abdominal pain and vomiting.  Genitourinary: Negative for dysuria and hematuria.  Musculoskeletal: Negative for arthralgias and back pain.  Skin: Positive for wound. Negative for color change and rash.  Neurological: Negative for seizures and syncope.  All other systems reviewed and are negative.    Physical Exam Triage Vital Signs ED Triage Vitals  Enc Vitals Group     BP 05/17/18 1518 (!) 160/94     Pulse  Rate 05/17/18 1518 69     Resp 05/17/18 1518 18     Temp 05/17/18 1518 98 F (36.7 C)     Temp src --      SpO2 05/17/18 1518 100 %     Weight --      Height --      Head Circumference --      Peak Flow --      Pain Score 05/17/18 1519 6     Pain Loc --      Pain Edu? --      Excl. in GC? --    No data found.  Updated Vital Signs BP (!) 160/94   Pulse 69   Temp 98 F (36.7 C)   Resp 18   SpO2 100%      Physical Exam  Constitutional: She is oriented to person, place, and time. She appears well-developed and well-nourished. No distress.  HENT:  Head: Normocephalic and atraumatic.  Mouth/Throat: Oropharynx is clear and moist.    Eyes: Pupils are equal, round, and reactive to light. Conjunctivae are normal.  Neck: Normal range of motion.  Cardiovascular: Normal rate.  Pulmonary/Chest: Effort normal. No respiratory distress.  Abdominal: Soft. She exhibits no distension.  Musculoskeletal: Normal range of motion. She exhibits no edema.  Neurological: She is alert and oriented to person, place, and time. She displays normal reflexes. Coordination normal.  Skin: Skin is warm and dry.  Superficial abrasions on chin.  Superficial laceration upper lip.  Ecchymosis with abrasion, 3 cm across, left shoulder anteriorly.  Both knees with superficial abrasions, no bruising or tenderness.     UC Treatments / Results  Labs (all labs ordered are listed, but only abnormal results are displayed) Labs Reviewed - No data to display  EKG None  Radiology No results found.  Procedures Laceration Repair Date/Time: 05/17/2018 9:33 PM Performed by: Eustace MooreNelson, Doniven Vanpatten Sue, MD Authorized by: Eustace MooreNelson, Tekesha Almgren Sue, MD   Consent:    Consent obtained:  Verbal   Consent given by:  Patient   Risks discussed:  Infection, need for additional repair and poor wound healing   Alternatives discussed:  No treatment Laceration details:    Location:  Lip   Lip location:  Upper interior lip   Length  (cm):  15   Depth (mm):  10 Repair type:    Repair type:  Simple Exploration:    Hemostasis achieved with:  Direct pressure   Contaminated: yes   Treatment:    Area cleansed with:  Saline Skin repair:    Repair method:  Sutures   Suture size:  4-0   Suture material:  Fast-absorbing gut   Suture technique:  Simple interrupted   Number of sutures:  1 Approximation:    Approximation:  Loose   Vermilion border: well-aligned   Post-procedure details:    Dressing:  Open (no dressing) Comments:     Upper lip anesthetized.  The front incisor was lodged in the upper lip, after anesthesia the lip had to be pulled down off of the tooth.  This left a defect in the inside upper lip that was then repaired.   (including critical care time)  Medications Ordered in UC Medications - No data to display  Initial Impression / Assessment and Plan / UC Course  I have reviewed the triage vital signs and the nursing notes.  Pertinent labs & imaging results that were available during my care of the patient were reviewed by me and considered in my medical decision making (see chart for details).     Discussed antibiotics for couple of days.  Patient is very leery because she has a history of hospitalization for C. difficile.  I gave her a lower dose of antibiotic.  This is appropriate for age.  I told her to try to take at least 48 hours.,  She is told to take it with food, told to take probiotics.  Stop immediately for any GI distress. Final Clinical Impressions(s) / UC Diagnoses   Final diagnoses:  Chin laceration, initial encounter  Fall in home, initial encounter     Discharge Instructions     Ice to area to reduce swelling and pain Use an antibiotic ointment on the open areas on your chin and lip Call your dentist tomorrow for advice on the loose tooth Take the antibiotic 3 times a day as tolerated to prevent infection.  Try to take for at least 48 to 72 hours.   ED Prescriptions     Medication Sig Dispense Auth. Provider   penicillin v potassium (VEETID) 250 MG tablet Take 1 tablet (250 mg total) by mouth 3 (three) times daily. 15 tablet Eustace Moore, MD     Controlled Substance Prescriptions [place] Controlled Substance Registry consulted? Not Applicable   Eustace Moore, MD 05/17/18 2135

## 2018-05-17 NOTE — Discharge Instructions (Addendum)
Ice to area to reduce swelling and pain Use an antibiotic ointment on the open areas on your chin and lip Call your dentist tomorrow for advice on the loose tooth Take the antibiotic 3 times a day as tolerated to prevent infection.  Try to take for at least 48 to 72 hours.

## 2018-05-17 NOTE — ED Triage Notes (Signed)
Pt states she tripped over some steps and hit her face on the step, pt has small puncture wound on top lip, lip is stuck to tooth. Denies LOC.

## 2019-12-10 ENCOUNTER — Encounter (HOSPITAL_COMMUNITY): Payer: Self-pay | Admitting: Emergency Medicine

## 2019-12-10 ENCOUNTER — Emergency Department (HOSPITAL_COMMUNITY): Payer: Medicare Other

## 2019-12-10 ENCOUNTER — Emergency Department (HOSPITAL_COMMUNITY)
Admission: EM | Admit: 2019-12-10 | Discharge: 2019-12-10 | Disposition: A | Discharge status: Home / Self Care | Payer: Medicare Other | Attending: Emergency Medicine | Admitting: Emergency Medicine

## 2019-12-10 DIAGNOSIS — R0789 Other chest pain: Secondary | ICD-10-CM | POA: Diagnosis present

## 2019-12-10 DIAGNOSIS — F419 Anxiety disorder, unspecified: Secondary | ICD-10-CM | POA: Diagnosis not present

## 2019-12-10 DIAGNOSIS — R079 Chest pain, unspecified: Secondary | ICD-10-CM

## 2019-12-10 LAB — CBC WITH DIFFERENTIAL/PLATELET
Abs Immature Granulocytes: 0.01 10*3/uL (ref 0.00–0.07)
Basophils Absolute: 0.1 10*3/uL (ref 0.0–0.1)
Basophils Relative: 1 %
Eosinophils Absolute: 0.1 10*3/uL (ref 0.0–0.5)
Eosinophils Relative: 1 %
HCT: 43.4 % (ref 36.0–46.0)
Hemoglobin: 14.2 g/dL (ref 12.0–15.0)
Immature Granulocytes: 0 %
Lymphocytes Relative: 24 %
Lymphs Abs: 1.6 10*3/uL (ref 0.7–4.0)
MCH: 31.9 pg (ref 26.0–34.0)
MCHC: 32.7 g/dL (ref 30.0–36.0)
MCV: 97.5 fL (ref 80.0–100.0)
Monocytes Absolute: 0.3 10*3/uL (ref 0.1–1.0)
Monocytes Relative: 4 %
Neutro Abs: 4.9 10*3/uL (ref 1.7–7.7)
Neutrophils Relative %: 70 %
Platelets: 217 10*3/uL (ref 150–400)
RBC: 4.45 MIL/uL (ref 3.87–5.11)
RDW: 13.6 % (ref 11.5–15.5)
WBC: 7 10*3/uL (ref 4.0–10.5)
nRBC: 0 % (ref 0.0–0.2)

## 2019-12-10 LAB — COMPREHENSIVE METABOLIC PANEL
ALT: 15 U/L (ref 0–44)
AST: 22 U/L (ref 15–41)
Albumin: 3.6 g/dL (ref 3.5–5.0)
Alkaline Phosphatase: 55 U/L (ref 38–126)
Anion gap: 10 (ref 5–15)
BUN: 9 mg/dL (ref 8–23)
CO2: 23 mmol/L (ref 22–32)
Calcium: 9.2 mg/dL (ref 8.9–10.3)
Chloride: 109 mmol/L (ref 98–111)
Creatinine, Ser: 1.05 mg/dL — ABNORMAL HIGH (ref 0.44–1.00)
GFR calc Af Amer: 57 mL/min — ABNORMAL LOW (ref >60)
GFR calc non Af Amer: 49 mL/min — ABNORMAL LOW (ref >60)
Glucose, Bld: 112 mg/dL — ABNORMAL HIGH (ref 70–99)
Potassium: 3.9 mmol/L (ref 3.5–5.1)
Sodium: 142 mmol/L (ref 135–145)
Total Bilirubin: 0.8 mg/dL (ref 0.3–1.2)
Total Protein: 6.2 g/dL — ABNORMAL LOW (ref 6.5–8.1)

## 2019-12-10 LAB — TROPONIN I (HIGH SENSITIVITY)
Troponin I (High Sensitivity): 3 ng/L (ref <18)
Troponin I (High Sensitivity): <2 ng/L (ref <18)

## 2019-12-10 MED ORDER — SODIUM CHLORIDE 0.9% FLUSH: 3.0000 mL | Freq: Once | INTRAVENOUS | Status: DC

## 2019-12-10 NOTE — ED Notes (Signed)
Pt is NSR on monitor 

## 2019-12-10 NOTE — ED Provider Notes (Addendum)
[facility] EMERGENCY DEPARTMENT Provider Note   CSN: [id number] Arrival date & time: 12/10/19  9509     History No chief complaint on file.   Shelby Arellano is a 85 y.o. female with PMH chronic loose stools secondary to C. difficile infection who presents to the ED with acute onset 7 out of 10 centrally located, nonradiating chest pressure that woke her from her sleep approximately 2.5 hours prior to arrival.  She also endorses lightheadedness and shortness of breath.  She denies any diaphoresis, headache, nausea or vomiting, radiating pain, aggravating or alleviating factors, fevers or chills, recent illness, dark stools, or other symptoms.  She denies any prior similar episodes of chest pain or any significant cardiac history.  She does endorse recent increased stress trying to coordinate COVID-19 vaccine and she was scheduled to have it administered this afternoon with her husband.  Four 81 mg aspirin and one nitroglycerin was administered by EMS in route to the hospital.  Patient denies pain and simply reports a "pressure" that is mildly improved with administration of medication while en route.   HPI     Past Medical History:  Diagnosis Date  . Arthritis   . Chronic diarrhea   . Clostridium difficile infection   . Complication of anesthesia    cannot have pentothol  . High cholesterol   . No pertinent past medical history   . Sciatica   . Seborrhea   . Vertigo     Patient Active Problem List   Diagnosis Date Noted  . HYPERLIPIDEMIA-MIXED 04/03/2009  . PALPITATIONS 03/17/2009  . SHORTNESS OF BREATH 03/17/2009  . DIZZINESS 03/15/2009    Past Surgical History:  Procedure Laterality Date  . APPENDECTOMY  1962  . CARPAL TUNNEL RELEASE  10/12   rt   . CARPAL TUNNEL RELEASE  02/25/2012   Procedure: CARPAL TUNNEL RELEASE;  Surgeon: Nicki Reaper, MD;  Location: [facility];  Service: Orthopedics;  Laterality: Left;  . CATARACT EXTRACTION    .  COLONOSCOPY    . EAR BIOPSY     growth lt ear  . HEMORROIDECTOMY  1999  . LUMBAR DISC SURGERY    . WISDOM TOOTH EXTRACTION       OB History   No obstetric history on file.     Family History  Problem Relation Age of Onset  . Heart disease Father     Social History   Tobacco Use  . Smoking status: Never Smoker  . Smokeless tobacco: Never Used  Substance Use Topics  . Alcohol use: No  . Drug use: No    Home Medications Prior to Admission medications   Medication Sig Start Date End Date Taking? Authorizing Provider  calcium-vitamin D (OSCAL WITH D) 500-200 MG-UNIT per tablet Take 1 tablet by mouth 2 (two) times daily.     [provider]  loperamide (IMODIUM) 2 MG capsule Take 2 mg by mouth 4 (four) times daily as needed.    [provider]  meclizine (ANTIVERT) 25 MG tablet Take 12.5 mg by mouth 2 (two) times daily as needed for dizziness. 07/02/16   [provider]  penicillin v potassium (VEETID) 250 MG tablet Take 1 tablet (250 mg total) by mouth 3 (three) times daily. 05/17/18   Eustace Moore, MD  Probiotic Product (PROBIOTIC DAILY) CAPS Take 1 capsule by mouth daily.    [provider]  saccharomyces boulardii (FLORASTOR) 250 MG capsule Take 250 mg by mouth 2 (  two) times daily.    [provider]    Allergies    Actonel [risedronate], Ciprofloxacin, Compazine [prochlorperazine edisylate], Fentanyl, Hydrocodone, Meloxicam, Other, Paroxetine, Phenergan [promethazine], Prednisone, Tramadol, and Trazodone and nefazodone  Review of Systems   Review of Systems  All other systems reviewed and are negative.   Physical Exam Updated Vital Signs BP 133/76   Pulse 71   Resp 13   SpO2 99%   Physical Exam Vitals and nursing note reviewed. Exam conducted with a chaperone present.  Constitutional:      Appearance: Normal appearance.  HENT:     Head: Normocephalic and atraumatic.  Eyes:     General: No scleral icterus.     Conjunctiva/sclera: Conjunctivae normal.  Cardiovascular:     Rate and Rhythm: Normal rate and regular rhythm.     Pulses: Normal pulses.     Heart sounds: Normal heart sounds.  Pulmonary:     Comments: Mildly increased respiratory effort.  Breath sounds intact bilaterally.  No wheezes or rales appreciated. Abdominal:     General: Abdomen is flat. There is no distension.     Palpations: Abdomen is soft.     Tenderness: There is no abdominal tenderness. There is no guarding.  Musculoskeletal:     Cervical back: Normal range of motion and neck supple. No rigidity.  Skin:    General: Skin is dry.  Neurological:     Mental Status: She is alert and oriented to person, place, and time.     GCS: GCS eye subscore is 4. GCS verbal subscore is 5. GCS motor subscore is 6.  Psychiatric:        Mood and Affect: Mood normal.        Behavior: Behavior normal.        Thought Content: Thought content normal.     ED Results / Procedures / Treatments   Labs (all labs ordered are listed, but only abnormal results are displayed) Labs Reviewed  COMPREHENSIVE METABOLIC PANEL - Abnormal; Notable for the following components:      Result Value   Glucose, Bld 112 (*)    Creatinine, Ser 1.05 (*)    Total Protein 6.2 (*)    GFR calc non Af Amer 49 (*)    GFR calc Af Amer 57 (*)    All other components within normal limits  CBC WITH DIFFERENTIAL/PLATELET  TROPONIN I (HIGH SENSITIVITY)  TROPONIN I (HIGH SENSITIVITY)    EKG EKG Interpretation  Date/Time:  Friday December 10 2019 09:38:27 EST Ventricular Rate:  73 PR Interval:    QRS Duration: 83 QT Interval:  404 QTC Calculation: 443 R Axis:   -61 Text Interpretation: Sinus rhythm Probable left atrial enlargement Left anterior fascicular block Abnormal R-wave progression, early transition Abnormal ECG Confirmed by Gerhard Munch [phone]) on 12/10/2019 9:47:08 AM   Radiology DG Chest 2 View  Result Date: 12/10/2019 CLINICAL DATA:  Chest pain.   Shortness of breath. EXAM: CHEST - 2 VIEW COMPARISON:  10/22/2016. FINDINGS: Mediastinum and hilar structures normal. Lungs are clear of infiltrates. Stable tiny nodule right mid lung most likely tiny granuloma. Mild left pleural thickening again noted consistent scarring. Mild cardiomegaly. No pulmonary venous congestion. Degenerative changes and scoliosis thoracic spine. IMPRESSION: Mild cardiomegaly, no pulmonary venous congestion. No acute pulmonary disease. Electronically Signed   By: Maisie Fus  Register   On: 12/10/2019 10:04    Procedures Procedures (including critical care time)  Medications Ordered in ED Medications  sodium chloride flush (NS) 0.9 %  injection 3 mL (has no administration in time range)    ED Course  I have reviewed the triage vital signs and the nursing notes.  Pertinent labs & imaging results that were available during my care of the patient were reviewed by me and considered in my medical decision making (see chart for details).    MDM Rules/Calculators/A&P                      DG chest demonstrates mild cardiomegaly, but without any acute cardiopulmonary disease.  CMP demonstrated a slight worsening of renal function, however her most recent labs were obtained 3 years ago.  No electrolyte abnormalities that may have otherwise contributed to her palpitations.  CBC demonstrates no anemia, evidence of infection, or other abnormalities.  I personally reviewed patient's past medical record and she has no history of echocardiograms or other CV procedures.   On reexamination, patient is feeling improved and suggest this is likely related to her anxiety.  She reports that she is extensive history of anxiety and being a [employer] attorney".  She is now simply more concerned about making it to her scheduled vaccination at 4 PM in [facility].  Her repeat troponin was improved from 3 to less than 2.  Her entire work-up here in the ED has been entirely reassuring.  She is no longer in any  significant discomfort.  The etiology of her chest pain could be related to her self-described anxiety.   On reexamination, patient reports that her chest pain has almost entirely resolved, however she is feeling mildly lightheaded and she suspects it is due to not eating or drinking anything all day.  I spoke with patient's husband who reports that she is very codependent and is anxious because he is not there with her.  We will provide her some food and fluids and at that point she is safe for discharge.  Patient feels improved after sandwich and water.  Discussed strict return precautions.  All of the evaluation and work-up results were discussed with the patient and any family at bedside. They were provided opportunity to ask any additional questions and have none at this time. They have expressed understanding of verbal discharge instructions as well as return precautions and are agreeable to the plan.    Final Clinical Impression(s) / ED Diagnoses Final diagnoses:  Nonspecific chest pain    Rx / DC Orders ED Discharge Orders    None       Corena Herter, PA-C 12/10/19 1253    Corena Herter, PA-C 12/10/19 1254    Carmin Muskrat, MD 12/12/19 [phone]

## 2019-12-10 NOTE — ED Triage Notes (Signed)
Pt here from home with c/o chest pain /pressure non radiating no nausea and slight sob  , pt got 324mg  assa and 1 nitro by ems

## 2019-12-10 NOTE — Discharge Instructions (Addendum)
Please read the attachment on nonspecific chest pain.  Your comprehensive work-up here in the ED today was reassuring.  Please call [facility] to schedule an appointment for ongoing evaluation and management of your chest discomfort.  It is important he also follow-up with your primary care provider regarding today's encounter.  Please return to the ED or seek medical attention immediately for any new or worsening symptoms.

## 2019-12-15 ENCOUNTER — Encounter: Payer: Self-pay | Admitting: Cardiovascular Disease

## 2019-12-15 ENCOUNTER — Ambulatory Visit (INDEPENDENT_AMBULATORY_CARE_PROVIDER_SITE_OTHER): Payer: Medicare Other | Admitting: Cardiovascular Disease

## 2019-12-15 ENCOUNTER — Other Ambulatory Visit: Payer: Self-pay

## 2019-12-15 DIAGNOSIS — R0789 Other chest pain: Secondary | ICD-10-CM | POA: Insufficient documentation

## 2019-12-15 NOTE — Assessment & Plan Note (Signed)
Shelby Arellano was recently seen in the emergency room on 12/10/2019 with atypical chest pressure which awakened her from sleep.  Her EKG was unremarkable.  Her enzymes were negative.  She does not get the symptoms ordinarily.  She has relatively few cardiac risk factors.  I do not think this is ischemically mediated at this point do not feel compelled to perform any further noninvasive testing.

## 2019-12-15 NOTE — Progress Notes (Signed)
12/15/2019 Shelby Arellano   [birth year]  [id number]  Primary Physician Shelby Sacramento, MD Primary Cardiologist: Shelby Harp MD Shelby Arellano, [place], [place]  HPI:  Shelby Arellano is a 84 y.o. thin appearing married Caucasian female mother of 4 sons, grandmother of 74 grandchildren accompanied by her husband Shelby Arellano today.  She was referred by Dr. Vanita Arellano in the emergency room for atypical chest pain.  She has no cardiac risk factors.  Her father did die of a myocardial infarction at age 57.  She gets very infrequent episodes of dizziness every 3 to 4 years.  She has been very anxious about scheduling her Covid vaccine administration.  She is awake and early in the morning with chest pressure and was seen in the emergency room.  Her EKG was nonacute.  Enzymes were negative.  She was sent home.  She said no recurrent symptoms.   Current Meds  Medication Sig  . calcium-vitamin D (OSCAL WITH D) 500-200 MG-UNIT per tablet Take 1 tablet by mouth 2 (two) times daily.   Marland Kitchen loperamide (IMODIUM) 2 MG capsule Take 2 mg by mouth 4 (four) times daily as needed.  . meclizine (ANTIVERT) 25 MG tablet Take 12.5 mg by mouth 2 (two) times daily as needed for dizziness.  . Probiotic Product (PROBIOTIC DAILY) CAPS Take 1 capsule by mouth daily.  Marland Kitchen saccharomyces boulardii (FLORASTOR) 250 MG capsule Take 250 mg by mouth 2 (two) times daily.     Allergies  Allergen Reactions  . Actonel [Risedronate] Other (See Comments)    Panic attacks and dizziness  . Ciprofloxacin Other (See Comments)    Exacerbated severe diarrhea  . Compazine [Prochlorperazine Edisylate] Other (See Comments)    Didn't work for patient, caused extreme disorientation also, confusion   . Fentanyl Other (See Comments)    Had trouble waking patient from anesthesia as result, given narcan to counteract, had several weeks of dizziness, uncommon fatigue  . Hydrocodone Other (See Comments)    disorientation  . Meloxicam Other (See Comments)   Disorientation and confusion  . Other Other (See Comments)    SODIUM PENTOTHAL-totally disoriented and irrational for about a week afterwards  . Paroxetine Other (See Comments)    Hyperactivity-didn't eat, sleep or sit still  . Phenergan [Promethazine] Diarrhea and Nausea And Vomiting    Didn't work for patient, caused extreme disorientation, violent shaking, hallucinations, inability of recognize husband at that time  . Prednisone Other (See Comments)    Hyperactivity-didn't eat, sleep, or sit still  . Tramadol Other (See Comments)    Disorientation and confusion  . Trazodone And Nefazodone Other (See Comments)    Extreme dizziness    Social History   Socioeconomic History  . Marital status: Married    Spouse name: Not on file  . Number of children: 4  . Years of education: BA  . Highest education level: Not on file  Occupational History  . Occupation: Retired   Tobacco Use  . Smoking status: Never Smoker  . Smokeless tobacco: Never Used  Substance and Sexual Activity  . Alcohol use: No  . Drug use: No  . Sexual activity: Not on file  Other Topics Concern  . Not on file  Social History Narrative   Drinks coffee occasionally    Social Determinants of Health   Financial Resource Strain:   . Difficulty of Paying Living Expenses: Not on file  Food Insecurity:   . Worried About Charity fundraiser in  the Last Year: Not on file  . Ran Out of Food in the Last Year: Not on file  Transportation Needs:   . Lack of Transportation (Medical): Not on file  . Lack of Transportation (Non-Medical): Not on file  Physical Activity:   . Days of Exercise per Week: Not on file  . Minutes of Exercise per Session: Not on file  Stress:   . Feeling of Stress : Not on file  Social Connections:   . Frequency of Communication with Friends and Family: Not on file  . Frequency of Social Gatherings with Friends and Family: Not on file  . Attends Religious Services: Not on file  . Active  Member of Clubs or Organizations: Not on file  . Attends Banker Meetings: Not on file  . Marital Status: Not on file  Intimate Partner Violence:   . Fear of Current or Ex-Partner: Not on file  . Emotionally Abused: Not on file  . Physically Abused: Not on file  . Sexually Abused: Not on file     Review of Systems: General: negative for chills, fever, night sweats or weight changes.  Cardiovascular: negative for chest pain, dyspnea on exertion, edema, orthopnea, palpitations, paroxysmal nocturnal dyspnea or shortness of breath Dermatological: negative for rash Respiratory: negative for cough or wheezing Urologic: negative for hematuria Abdominal: negative for nausea, vomiting, diarrhea, bright red blood per rectum, melena, or hematemesis Neurologic: negative for visual changes, syncope, or dizziness All other systems reviewed and are otherwise negative except as noted above.    Blood pressure 136/86, pulse 98, temperature 98 F (36.7 C), height 5\' 2"  (1.575 m), weight 133 lb (60.3 kg).  General appearance: alert and no distress Neck: no adenopathy, no carotid bruit, no JVD, supple, symmetrical, trachea midline and thyroid not enlarged, symmetric, no tenderness/mass/nodules Lungs: clear to auscultation bilaterally Heart: regular rate and rhythm, S1, S2 normal, no murmur, click, rub or gallop Extremities: edema No edema Pulses: 2+ and symmetric Skin: Skin color, texture, turgor normal. No rashes or lesions Neurologic: Alert and oriented X 3, normal strength and tone. Normal symmetric reflexes. Normal coordination and gait  EKG not performed today  ASSESSMENT AND PLAN:   Atypical chest pain Shelby Arellano was recently seen in the emergency room on 12/10/2019 with atypical chest pressure which awakened her from sleep.  Her EKG was unremarkable.  Her enzymes were negative.  She does not get the symptoms ordinarily.  She has relatively few cardiac risk factors.  I do not think  this is ischemically mediated at this point do not feel compelled to perform any further noninvasive testing.      12/12/2019 MD FACP,FACC,FAHA, [facility] 12/15/2019 3:27 PM

## 2019-12-15 NOTE — Patient Instructions (Signed)
Medication Instructions:  Your physician recommends that you continue on your current medications as directed. Please refer to the Current Medication list given to you today.  If you need a refill on your cardiac medications before your next appointment, please call your pharmacy.   Lab work: NONE  Testing/Procedures: NONE  Follow-Up: At [facility], you and your health needs are our priority.  As part of our continuing mission to provide you with exceptional heart care, we have created designated Provider Care Teams.  These Care Teams include your primary Cardiologist (physician) and Advanced Practice Providers (APPs -  Physician Assistants and Nurse Practitioners) who all work together to provide you with the care you need, when you need it. You may see Dr. Berry or one of the following Advanced Practice Providers on your designated Care Team:    Luke Kilroy, PA-C  Callie Goodrich, PA-C  Jesse Cleaver, FNP  Your physician wants you to follow-up in: 6 months with Dr. Berry      

## 2020-06-27 ENCOUNTER — Other Ambulatory Visit: Payer: Self-pay | Admitting: Family Medicine

## 2020-06-27 DIAGNOSIS — R531 Weakness: Secondary | ICD-10-CM

## 2020-06-27 DIAGNOSIS — R410 Disorientation, unspecified: Secondary | ICD-10-CM

## 2020-06-28 ENCOUNTER — Ambulatory Visit
Admission: RE | Admit: 2020-06-28 | Discharge: 2020-06-28 | Disposition: A | Discharge status: Home / Self Care | Payer: Medicare Other | Source: Ambulatory Visit | Attending: Family Medicine | Admitting: Family Medicine

## 2020-06-28 DIAGNOSIS — R410 Disorientation, unspecified: Secondary | ICD-10-CM

## 2020-06-28 DIAGNOSIS — R531 Weakness: Secondary | ICD-10-CM

## 2020-07-03 ENCOUNTER — Other Ambulatory Visit: Payer: Self-pay | Admitting: Family Medicine

## 2020-07-03 ENCOUNTER — Other Ambulatory Visit: Payer: Self-pay | Admitting: Physician Assistant

## 2020-07-03 DIAGNOSIS — R627 Adult failure to thrive: Secondary | ICD-10-CM

## 2020-07-03 DIAGNOSIS — R5381 Other malaise: Secondary | ICD-10-CM

## 2020-07-03 DIAGNOSIS — R5383 Other fatigue: Secondary | ICD-10-CM

## 2020-07-13 ENCOUNTER — Ambulatory Visit
Admission: RE | Admit: 2020-07-13 | Discharge: 2020-07-13 | Disposition: A | Discharge status: Home / Self Care | Payer: Medicare Other | Source: Ambulatory Visit | Attending: Family Medicine | Admitting: Family Medicine

## 2020-07-13 DIAGNOSIS — R627 Adult failure to thrive: Secondary | ICD-10-CM

## 2020-07-13 DIAGNOSIS — R5381 Other malaise: Secondary | ICD-10-CM

## 2020-08-09 ENCOUNTER — Encounter: Payer: Self-pay | Admitting: Neurology

## 2020-08-09 ENCOUNTER — Other Ambulatory Visit: Payer: Self-pay

## 2020-08-09 ENCOUNTER — Ambulatory Visit (INDEPENDENT_AMBULATORY_CARE_PROVIDER_SITE_OTHER): Payer: Medicare Other | Admitting: Neurology

## 2020-08-09 VITALS — BP 160/87 | HR 100 | Ht 62.0 in | Wt 128.0 lb

## 2020-08-09 DIAGNOSIS — R2689 Other abnormalities of gait and mobility: Secondary | ICD-10-CM | POA: Diagnosis not present

## 2020-08-09 DIAGNOSIS — R42 Dizziness and giddiness: Secondary | ICD-10-CM | POA: Diagnosis not present

## 2020-08-09 NOTE — Patient Instructions (Signed)
It was nice to see you again. I am glad to hear you are feeling better. It is possible that you have episodic vertigo. If this comes back, I would recommend that you get evaluation through ENT for vestibular dysfunction, meaning inner ear balance apparatus dysfunction and they can do additional testing.  Also, when you have an episode of dizziness, physical therapy can help. Please talk to Dr. Andrey Campanile about this. Your neurological exam does not suggest any current blood pressure drop with changes in your body position, vertigo, no evidence of strokelike findings, no cerebellar dysfunction, meaning no problem with regards to your small part of the brain which helps with coordination and balance.   I am pleased to say that I can release you back to Dr. Tawana Scale care.

## 2020-08-09 NOTE — Progress Notes (Signed)
Subjective:    Patient ID: Shelby Arellano is a 85 y.o. female.  HPI     Star Age, MD, PhD [facility] [address] [place], [place] [address]  Dear Dr. Redmond Pulling,    I saw your patient, Shelby Arellano, upon your kind request, in my neurologic clinic today for re-consultation of her dizziness. I had evaluated her for dizziness in August 2017.  The patient is accompanied by her husband today.  As you know, [name] is an 85 year old right-handed woman with an underlying medical history of vertigo, sciatica, hyperlipidemia, C. difficile infection in the past, chronic diarrhea, arthritis, anxiety, history of chickenpox, history of malaria, and B12 deficiency, who reports an episode of dizziness which started in the month of June, probably towards the end of June. It lasted through the month of August but is better now. She reports a sense of imbalance. Initially she described it as a spinning sensation but then reported that there is no actual spinning sensation, denies any nausea or vomiting, reported 1 fall in July. She did not injure herself but had some bruising from it. She denies any ringing in the ears or hearing loss chronically or episodically. She has not had any physical therapy for this or has seen ENT for this but has tried several different medications. Meclizine made her symptoms worse. She also felt that lorazepam made her worse and she had severe side effects with Wellbutrin which was tried the most recent. She reports that she could hardly write when she was on Wellbutrin. She is no longer on any of these medications. She reports that as she came off of these medicines that she improved. She is currently without any significant symptoms, denies any sudden onset of one-sided weakness or numbness or tingling or droopy face or slurring of speech. She has no recurrent headaches. She reports that originally her dizziness started some 20 years ago when  she came down with C. difficile infection and was treated with multiple different antibiotics. She also saw a specialist at [facility] at the time for C. difficile infection and was told that she could use Imodium as needed so long as she takes less than 4 pills a day. On average, she takes 1 pill a day. She tries to hydrate well. She usually tries to walk every day, about a mile to 2 miles. They also do some exercises at the house according to the husband.  I reviewed your office note from 05/17/2020.  She was reporting a headache at the time.  She had blood work through your office including ESR which was normal. She had a head CT without contrast on 06/28/2020 and I reviewed the results:   IMPRESSION: Atrophy, primarily in the frontal and temporal lobe regions. Slight periventricular small vessel disease. No acute infarct. No mass or hemorrhage.   There are foci of arterial vascular calcification. There is mild mucosal thickening in several ethmoid air cells.  She had a prior brain MRI without contrast on 07/02/2016 reviewed the results:   IMPRESSION:  Abnormal MRI scan of the brain showing  1.mild age-related changes of chronic microvascular ischemia and generalized cerebral atrophy.  2. Mild changes of chronic paranasal sinusitis. 3.Marked degenerative changes in the upper cervical spine with C2-3 central disc protrusion.  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 She uses a walking stick when she  goes for a longer walk or when hiking.  Previously:   06/20/16: 85 year old right-handed woman with an underlying medical history of C. difficile colitis in the past, chronic diarrhea, tinnitus, hyperlipidemia, osteoporosis, carpal tunnel surgery, multiple medication intolerances and adverse reactions to medications, who reports intermittent dizziness, particularly when standing up quickly,  mostly in keeping with lightheadedness, some vertiginous component at times. She has a chronic history of tendinitis of over 18 years duration. She has tinnitus bilaterally. She has no evidence of hearing loss. She had intermittent palpitations and chest heaviness. She went with recurrent dizziness symptoms to the emergency room on 05/23/2016 and I reviewed the emergency room records. Ear examination revealed some desiccated material or foreign body, perhaps a take, patient had no signs of infection, no rash, no cerumen impaction. She feels a little better overall, she was given meclizine in the emergency room for as needed use and feels that it has been somewhat helpful. She does report that it is sedating. She is trying to stay better hydrated. She usually walks regularly. She also brings in a 2 page writ up about her medical history and medication intolerances, as well as concerns or problems, which I reviewed as well. She is a nonsmoker. She has 4 grown children. She does not drink alcohol or use illicit drugs, caffeine occasionally in the form of coffee. She does not take very many medications at this time, probiotic, calcium, Imodium as needed for chronic diarrhea. Her Past Medical History Is Significant For: Past Medical History:  Diagnosis Date  . Arthritis   . Chronic diarrhea   . Clostridium difficile infection   . Complication of anesthesia    cannot have pentothol  . High cholesterol   . No pertinent past medical history   . Sciatica   . Seborrhea   . Vertigo     Her Past Surgical History Is Significant For: Past Surgical History:  Procedure Laterality Date  . APPENDECTOMY  1962  . CARPAL TUNNEL RELEASE  10/12   rt   . CARPAL TUNNEL RELEASE  02/25/2012   Procedure: CARPAL TUNNEL RELEASE;  Surgeon: Wynonia Sours, MD;  Location: [facility];  Service: Orthopedics;  Laterality: Left;  . CATARACT EXTRACTION    . COLONOSCOPY    . EAR BIOPSY     growth lt ear  .  HEMORROIDECTOMY  1999  . LUMBAR DISC SURGERY    . WISDOM TOOTH EXTRACTION      Her Family History Is Significant For: Family History  Problem Relation Age of Onset  . Heart disease Father     Her Social History Is Significant For: Social History   Socioeconomic History  . Marital status: Married    Spouse name: Not on file  . Number of children: 4  . Years of education: BA  . Highest education level: Not on file  Occupational History  . Occupation: Retired   Tobacco Use  . Smoking status: Never Smoker  . Smokeless tobacco: Never Used  Substance and Sexual Activity  . Alcohol use: No  . Drug use: No  . Sexual activity: Not on file  Other Topics Concern  . Not on file  Social History Narrative   Drinks coffee occasionally    Social Determinants of Health   Financial Resource Strain:   . Difficulty of Paying Living Expenses: Not on file  Food Insecurity:   . Worried About Charity fundraiser in the Last Year: Not on file  . Ran Out  of Food in the Last Year: Not on file  Transportation Needs:   . Lack of Transportation (Medical): Not on file  . Lack of Transportation (Non-Medical): Not on file  Physical Activity:   . Days of Exercise per Week: Not on file  . Minutes of Exercise per Session: Not on file  Stress:   . Feeling of Stress : Not on file  Social Connections:   . Frequency of Communication with Friends and Family: Not on file  . Frequency of Social Gatherings with Friends and Family: Not on file  . Attends Religious Services: Not on file  . Active Member of Clubs or Organizations: Not on file  . Attends Archivist Meetings: Not on file  . Marital Status: Not on file    Her Allergies Are:  Allergies  Allergen Reactions  . Actonel [Risedronate] Other (See Comments)    Panic attacks and dizziness  . Ciprofloxacin Other (See Comments)    Exacerbated severe diarrhea  . Compazine [Prochlorperazine Edisylate] Other (See Comments)    Didn't work  for patient, caused extreme disorientation also, confusion   . Fentanyl Other (See Comments)    Had trouble waking patient from anesthesia as result, given narcan to counteract, had several weeks of dizziness, uncommon fatigue  . Hydrocodone Other (See Comments)    disorientation  . Meloxicam Other (See Comments)    Disorientation and confusion  . Other Other (See Comments)    SODIUM PENTOTHAL-totally disoriented and irrational for about a week afterwards  . Paroxetine Other (See Comments)    Hyperactivity-didn't eat, sleep or sit still  . Phenergan [Promethazine] Diarrhea and Nausea And Vomiting    Didn't work for patient, caused extreme disorientation, violent shaking, hallucinations, inability of recognize husband at that time  . Prednisone Other (See Comments)    Hyperactivity-didn't eat, sleep, or sit still  . Tramadol Other (See Comments)    Disorientation and confusion  . Trazodone And Nefazodone Other (See Comments)    Extreme dizziness  :   Her Current Medications Are:  Outpatient Encounter Medications as of 08/09/2020  Medication Sig  . calcium-vitamin D (OSCAL WITH D) 500-200 MG-UNIT per tablet Take 1 tablet by mouth 2 (two) times daily.   Marland Kitchen loperamide (IMODIUM) 2 MG capsule Take 2 mg by mouth 4 (four) times daily as needed.  . meclizine (ANTIVERT) 25 MG tablet Take 12.5 mg by mouth 2 (two) times daily as needed for dizziness.  . Probiotic Product (PROBIOTIC DAILY) CAPS Take 1 capsule by mouth daily.  Marland Kitchen saccharomyces boulardii (FLORASTOR) 250 MG capsule Take 250 mg by mouth 2 (two) times daily.   No facility-administered encounter medications on file as of 08/09/2020.  : Review of Systems:  Out of a complete 14 point review of systems, all are reviewed and negative with the exception of these symptoms as listed below:  Review of Systems  Neurological:       PCP requested a consult with neurologist due to dizziness. Pt reports she feels like the dizziness was due to  medications certain meds she was taking ( unsure of the name).  Pt has been given meclizine but avoids taking. Pt had a CT of the head completed back in August sts she never received the results.     Objective:  Neurological Exam  Physical Exam Physical Examination:   Vitals:   08/09/20 0929 08/09/20 0935  BP: (!) 154/87 (!) 160/87  Pulse: 87 100   General Examination: The patient is a  very pleasant 85 y.o. female in no acute distress. She appears well-developed and well-nourished and well groomed. She denies any orthostatic lightheadedness or dizziness or vertiginous symptoms currently.  HEENT: Normocephalic, atraumatic, pupils are equal, round and reactive to light, corrective eyeglasses in place. Hearing is intact. Tympanic membranes are clear bilaterally. Face is symmetric with normal facial animation and normal facial sensation to light touch, temperature and vibration. Speech is clear, no dysarthria, no hypophonia, no voice tremor. There is no lip, neck/head, jaw tremor. Neck is supple with full range of passive and active motion. There are no carotid bruits on auscultation. Oropharynx exam reveals: mild  to moderate mouth dryness, adequate dental hygiene. Tongue protrudes centrally and palate elevates symmetrically.   Chest: Clear to auscultation without wheezing, rhonchi or crackles noted.  Heart: S1+S2+0, regular and normal without murmurs, rubs or gallops noted.   Abdomen: Soft, non-tender and non-distended with normal bowel sounds appreciated on auscultation.  Extremities: There is no pitting edema in the distal lower extremities bilaterally. Pedal pulses are intact.  Skin: Warm and dry without trophic changes noted. There are no varicose veins.  Musculoskeletal: exam reveals no obvious joint deformities, tenderness or joint swelling or erythema. Wearing a brace/elastic belt around her abdomen/waist.  Neurologically:  Mental status: The patient is awake, alert and  oriented in all 4 spheres. Her immediate and remote memory, attention, language skills and fund of knowledge are appropriate. There is no evidence of aphasia, agnosia, apraxia or anomia. Speech is clear with normal prosody and enunciation. Thought process is linear. Mood is normal and affect is normal.  Cranial nerves II - XII are as described above under HEENT exam.  Motor exam: Normal bulk, strength and tone is noted. There is no drift, tremor or rebound. Romberg is negative. Reflexes are 1+ throughout. Fine motor skills and coordination: intact with normal finger taps, normal hand movements, normal rapid alternating patting, normal foot taps and normal foot agility.  Cerebellar testing: No dysmetria or intention tremor on finger to nose testing. Heel to shin is unremarkable bilaterally. There is no truncal or gait ataxia.  Sensory exam: intact to light touch, vibration, temperature sense in the upper and lower extremities.  Gait, station and balance: She stands easily. No veering to one side is noted. No leaning to one side is noted. Posture is age-appropriate and stance is narrow based. Gait shows normal stride length and pace. No problems turning are noted. Tandem walk is challenging for her.               Assessment and plan:   In summary, CEDAR ROSEMAN is an 85 year old female with an underlying medical history of C. difficile colitis in the past, chronic diarrhea, hyperlipidemia, osteoporosis, carpal tunnel surgery, multiple medication intolerances and adverse reactions to medications, who  presents for evaluation of her dizziness which started in mid or late June and improved after approximately 2 months. She is currently without symptoms. Neurological exam does not suggest any orthostatic hypotension, cerebellar dysfunction, strokelike scenario, or vertigo at this time. She has had several episodes over the course of approximately 20 years. She is advised to seek ENT input if she should have  another spell/episode. She could benefit from additional testing for vestibular dysfunction. Physical therapy with vestibular rehab can also be beneficial to overcome symptoms faster. At this juncture, I do not see any pressing reason to pursue another brain MRI or additional testing from my end of things. She is reminded to stay well-hydrated  and continue with regular exercises within her limitations of course. She is advised to consider using a cane more consistently for gait stability. She is advised to follow-up with your office as scheduled. I answered all their questions today and the patient and her husband were in agreement.  Thank you very much for allowing me to participate in the care of this nice patient. If I can be of any further assistance to you please do not hesitate to call me at [phone].  Sincerely,   Star Age, MD, PhD

## 2021-02-16 ENCOUNTER — Emergency Department (HOSPITAL_COMMUNITY): Payer: No Typology Code available for payment source

## 2021-02-16 ENCOUNTER — Other Ambulatory Visit: Payer: Self-pay

## 2021-02-16 ENCOUNTER — Inpatient Hospital Stay (HOSPITAL_COMMUNITY)
Admission: EM | Admit: 2021-02-16 | Discharge: 2021-02-26 | DRG: 460 | Disposition: A | Discharge status: Rehabilitation Facility | Payer: No Typology Code available for payment source | Attending: Neurological Surgery | Admitting: Neurological Surgery

## 2021-02-16 ENCOUNTER — Encounter (HOSPITAL_COMMUNITY): Payer: Self-pay | Admitting: Emergency Medicine

## 2021-02-16 DIAGNOSIS — R739 Hyperglycemia, unspecified: Secondary | ICD-10-CM | POA: Diagnosis present

## 2021-02-16 DIAGNOSIS — Z419 Encounter for procedure for purposes other than remedying health state, unspecified: Secondary | ICD-10-CM

## 2021-02-16 DIAGNOSIS — S32001S Stable burst fracture of unspecified lumbar vertebra, sequela: Secondary | ICD-10-CM | POA: Diagnosis not present

## 2021-02-16 DIAGNOSIS — Z885 Allergy status to narcotic agent status: Secondary | ICD-10-CM

## 2021-02-16 DIAGNOSIS — Z981 Arthrodesis status: Secondary | ICD-10-CM | POA: Diagnosis not present

## 2021-02-16 DIAGNOSIS — E7800 Pure hypercholesterolemia, unspecified: Secondary | ICD-10-CM | POA: Diagnosis present

## 2021-02-16 DIAGNOSIS — N390 Urinary tract infection, site not specified: Secondary | ICD-10-CM | POA: Diagnosis not present

## 2021-02-16 DIAGNOSIS — Z20822 Contact with and (suspected) exposure to covid-19: Secondary | ICD-10-CM | POA: Diagnosis present

## 2021-02-16 DIAGNOSIS — Z8249 Family history of ischemic heart disease and other diseases of the circulatory system: Secondary | ICD-10-CM | POA: Diagnosis not present

## 2021-02-16 DIAGNOSIS — E876 Hypokalemia: Secondary | ICD-10-CM | POA: Diagnosis present

## 2021-02-16 DIAGNOSIS — N319 Neuromuscular dysfunction of bladder, unspecified: Secondary | ICD-10-CM | POA: Diagnosis present

## 2021-02-16 DIAGNOSIS — G8911 Acute pain due to trauma: Secondary | ICD-10-CM | POA: Diagnosis present

## 2021-02-16 DIAGNOSIS — R208 Other disturbances of skin sensation: Secondary | ICD-10-CM | POA: Diagnosis present

## 2021-02-16 DIAGNOSIS — S2220XD Unspecified fracture of sternum, subsequent encounter for fracture with routine healing: Secondary | ICD-10-CM | POA: Diagnosis not present

## 2021-02-16 DIAGNOSIS — Y92239 Unspecified place in hospital as the place of occurrence of the external cause: Secondary | ICD-10-CM | POA: Diagnosis not present

## 2021-02-16 DIAGNOSIS — I10 Essential (primary) hypertension: Secondary | ICD-10-CM | POA: Diagnosis present

## 2021-02-16 DIAGNOSIS — S32021D Stable burst fracture of second lumbar vertebra, subsequent encounter for fracture with routine healing: Secondary | ICD-10-CM | POA: Diagnosis present

## 2021-02-16 DIAGNOSIS — S2220XA Unspecified fracture of sternum, initial encounter for closed fracture: Secondary | ICD-10-CM | POA: Diagnosis present

## 2021-02-16 DIAGNOSIS — Z888 Allergy status to other drugs, medicaments and biological substances status: Secondary | ICD-10-CM

## 2021-02-16 DIAGNOSIS — G4720 Circadian rhythm sleep disorder, unspecified type: Secondary | ICD-10-CM | POA: Diagnosis present

## 2021-02-16 DIAGNOSIS — S32001A Stable burst fracture of unspecified lumbar vertebra, initial encounter for closed fracture: Secondary | ICD-10-CM | POA: Diagnosis present

## 2021-02-16 DIAGNOSIS — R410 Disorientation, unspecified: Secondary | ICD-10-CM | POA: Diagnosis present

## 2021-02-16 DIAGNOSIS — Z79899 Other long term (current) drug therapy: Secondary | ICD-10-CM

## 2021-02-16 DIAGNOSIS — Y92410 Unspecified street and highway as the place of occurrence of the external cause: Secondary | ICD-10-CM

## 2021-02-16 DIAGNOSIS — K529 Noninfective gastroenteritis and colitis, unspecified: Secondary | ICD-10-CM | POA: Diagnosis present

## 2021-02-16 DIAGNOSIS — B9620 Unspecified Escherichia coli [E. coli] as the cause of diseases classified elsewhere: Secondary | ICD-10-CM | POA: Diagnosis not present

## 2021-02-16 DIAGNOSIS — S32021A Stable burst fracture of second lumbar vertebra, initial encounter for closed fracture: Secondary | ICD-10-CM | POA: Diagnosis present

## 2021-02-16 DIAGNOSIS — R52 Pain, unspecified: Secondary | ICD-10-CM | POA: Diagnosis not present

## 2021-02-16 DIAGNOSIS — R911 Solitary pulmonary nodule: Secondary | ICD-10-CM

## 2021-02-16 DIAGNOSIS — R339 Retention of urine, unspecified: Secondary | ICD-10-CM | POA: Diagnosis present

## 2021-02-16 DIAGNOSIS — Z881 Allergy status to other antibiotic agents status: Secondary | ICD-10-CM | POA: Diagnosis not present

## 2021-02-16 DIAGNOSIS — E785 Hyperlipidemia, unspecified: Secondary | ICD-10-CM | POA: Diagnosis present

## 2021-02-16 DIAGNOSIS — D62 Acute posthemorrhagic anemia: Secondary | ICD-10-CM | POA: Diagnosis present

## 2021-02-16 DIAGNOSIS — Z886 Allergy status to analgesic agent status: Secondary | ICD-10-CM | POA: Diagnosis not present

## 2021-02-16 DIAGNOSIS — S32000A Wedge compression fracture of unspecified lumbar vertebra, initial encounter for closed fracture: Secondary | ICD-10-CM

## 2021-02-16 DIAGNOSIS — K592 Neurogenic bowel, not elsewhere classified: Secondary | ICD-10-CM | POA: Diagnosis present

## 2021-02-16 DIAGNOSIS — F4323 Adjustment disorder with mixed anxiety and depressed mood: Secondary | ICD-10-CM | POA: Diagnosis present

## 2021-02-16 LAB — COMPREHENSIVE METABOLIC PANEL
ALT: 27 U/L (ref 0–44)
AST: 55 U/L — ABNORMAL HIGH (ref 15–41)
Albumin: 3.5 g/dL (ref 3.5–5.0)
Alkaline Phosphatase: 64 U/L (ref 38–126)
Anion gap: 7 (ref 5–15)
BUN: 14 mg/dL (ref 8–23)
CO2: 25 mmol/L (ref 22–32)
Calcium: 9.3 mg/dL (ref 8.9–10.3)
Chloride: 106 mmol/L (ref 98–111)
Creatinine, Ser: 1.02 mg/dL — ABNORMAL HIGH (ref 0.44–1.00)
GFR, Estimated: 55 mL/min — ABNORMAL LOW (ref >60)
Glucose, Bld: 111 mg/dL — ABNORMAL HIGH (ref 70–99)
Potassium: 3.8 mmol/L (ref 3.5–5.1)
Sodium: 138 mmol/L (ref 135–145)
Total Bilirubin: 0.8 mg/dL (ref 0.3–1.2)
Total Protein: 6.5 g/dL (ref 6.5–8.1)

## 2021-02-16 LAB — RESP PANEL BY RT-PCR (FLU A&B, COVID) ARPGX2
Influenza A by PCR: NEGATIVE
Influenza B by PCR: NEGATIVE
SARS Coronavirus 2 by RT PCR: NEGATIVE

## 2021-02-16 LAB — CBC
HCT: 43.9 % (ref 36.0–46.0)
Hemoglobin: 14.1 g/dL (ref 12.0–15.0)
MCH: 31.5 pg (ref 26.0–34.0)
MCHC: 32.1 g/dL (ref 30.0–36.0)
MCV: 98 fL (ref 80.0–100.0)
Platelets: 190 10*3/uL (ref 150–400)
RBC: 4.48 MIL/uL (ref 3.87–5.11)
RDW: 14.5 % (ref 11.5–15.5)
WBC: 10 10*3/uL (ref 4.0–10.5)
nRBC: 0 % (ref 0.0–0.2)

## 2021-02-16 LAB — I-STAT CHEM 8, ED
BUN: 16 mg/dL (ref 8–23)
Calcium, Ion: 1.11 mmol/L — ABNORMAL LOW (ref 1.15–1.40)
Chloride: 106 mmol/L (ref 98–111)
Creatinine, Ser: 0.9 mg/dL (ref 0.44–1.00)
Glucose, Bld: 106 mg/dL — ABNORMAL HIGH (ref 70–99)
HCT: 43 % (ref 36.0–46.0)
Hemoglobin: 14.6 g/dL (ref 12.0–15.0)
Potassium: 3.7 mmol/L (ref 3.5–5.1)
Sodium: 140 mmol/L (ref 135–145)
TCO2: 24 mmol/L (ref 22–32)

## 2021-02-16 LAB — PROTIME-INR
INR: 0.9 (ref 0.8–1.2)
Prothrombin Time: 11.9 seconds (ref 11.4–15.2)

## 2021-02-16 MED ORDER — OXYCODONE-ACETAMINOPHEN 5-325 MG PO TABS
1.0000 | ORAL_TABLET | Freq: Once | ORAL | Status: AC
  Administered 2021-02-16: 1 via ORAL
  Filled 2021-02-16: qty 1

## 2021-02-16 MED ORDER — DOCUSATE SODIUM 100 MG PO CAPS
100.0000 mg | ORAL_CAPSULE | Freq: Two times a day (BID) | ORAL | Status: DC
  Administered 2021-02-18 – 2021-02-26 (×13): 100 mg via ORAL
  Filled 2021-02-16 (×15): qty 1

## 2021-02-16 MED ORDER — OXYCODONE HCL 5 MG PO TABS
5.0000 mg | ORAL_TABLET | ORAL | Status: DC | PRN
  Administered 2021-02-18 – 2021-02-20 (×4): 5 mg via ORAL
  Filled 2021-02-16 (×4): qty 1

## 2021-02-16 MED ORDER — HYDRALAZINE HCL 25 MG PO TABS: 25.0000 mg | ORAL_TABLET | Freq: Three times a day (TID) | ORAL | Status: DC | PRN

## 2021-02-16 MED ORDER — IOHEXOL 300 MG/ML  SOLN
100.0000 mL | Freq: Once | INTRAMUSCULAR | Status: AC | PRN
  Administered 2021-02-16: 100 mL via INTRAVENOUS

## 2021-02-16 MED ORDER — ACETAMINOPHEN 325 MG PO TABS: 650.0000 mg | ORAL_TABLET | ORAL | Status: DC | PRN

## 2021-02-16 MED ORDER — MORPHINE SULFATE (PF) 2 MG/ML IV SOLN
2.0000 mg | INTRAVENOUS | Status: DC | PRN
  Administered 2021-02-16: 2 mg via INTRAVENOUS
  Administered 2021-02-17 (×4): 4 mg via INTRAVENOUS
  Filled 2021-02-16 (×4): qty 2
  Filled 2021-02-16: qty 1

## 2021-02-16 MED ORDER — DEXTROSE-NACL 5-0.45 % IV SOLN: INTRAVENOUS | Status: DC

## 2021-02-16 MED ORDER — ONDANSETRON 4 MG PO TBDP: 4.0000 mg | ORAL_TABLET | Freq: Four times a day (QID) | ORAL | Status: DC | PRN

## 2021-02-16 MED ORDER — ONDANSETRON HCL 4 MG/2ML IJ SOLN
4.0000 mg | Freq: Four times a day (QID) | INTRAMUSCULAR | Status: DC | PRN
  Administered 2021-02-17 – 2021-02-19 (×3): 4 mg via INTRAVENOUS
  Filled 2021-02-16 (×4): qty 2
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
## 2021-02-16 NOTE — Progress Notes (Signed)
Orthopedic Tech Progress Note Patient Details:  Shelby Arellano Dec 31, 1934 [id number] Aspen Collar has been given to patient's nurse Ortho Devices Type of Ortho Device: Aspen cervical collar Ortho Device/Splint Interventions: Delynn Flavin Reba Hulett 02/16/2021, 9:33 PM

## 2021-02-16 NOTE — ED Notes (Signed)
ED Provider at bedside. 
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
## 2021-02-16 NOTE — ED Notes (Signed)
Ortho tech aware of need for aspen collar.

## 2021-02-17 ENCOUNTER — Inpatient Hospital Stay (HOSPITAL_COMMUNITY): Payer: No Typology Code available for payment source

## 2021-02-17 LAB — CBC
HCT: 37.8 % (ref 36.0–46.0)
Hemoglobin: 12.5 g/dL (ref 12.0–15.0)
MCH: 32 pg (ref 26.0–34.0)
MCHC: 33.1 g/dL (ref 30.0–36.0)
MCV: 96.7 fL (ref 80.0–100.0)
Platelets: 169 10*3/uL (ref 150–400)
RBC: 3.91 MIL/uL (ref 3.87–5.11)
RDW: 14.6 % (ref 11.5–15.5)
WBC: 10.6 10*3/uL — ABNORMAL HIGH (ref 4.0–10.5)
nRBC: 0.2 % (ref 0.0–0.2)

## 2021-02-17 LAB — BASIC METABOLIC PANEL
Anion gap: 6 (ref 5–15)
BUN: 14 mg/dL (ref 8–23)
CO2: 24 mmol/L (ref 22–32)
Calcium: 8.4 mg/dL — ABNORMAL LOW (ref 8.9–10.3)
Chloride: 106 mmol/L (ref 98–111)
Creatinine, Ser: 0.93 mg/dL (ref 0.44–1.00)
GFR, Estimated: >60 mL/min (ref >60)
Glucose, Bld: 150 mg/dL — ABNORMAL HIGH (ref 70–99)
Potassium: 4.1 mmol/L (ref 3.5–5.1)
Sodium: 136 mmol/L (ref 135–145)

## 2021-02-17 MED ORDER — POLYETHYLENE GLYCOL 3350 17 G PO PACK
17.0000 g | PACK | Freq: Every day | ORAL | Status: DC
  Administered 2021-02-19 – 2021-02-26 (×4): 17 g via ORAL
  Filled 2021-02-17 (×7): qty 1

## 2021-02-17 MED ORDER — CHLORHEXIDINE GLUCONATE CLOTH 2 % EX PADS
6.0000 | MEDICATED_PAD | Freq: Every day | CUTANEOUS | Status: DC
  Administered 2021-02-17 – 2021-02-21 (×5): 6 via TOPICAL

## 2021-02-17 MED ORDER — MORPHINE SULFATE (PF) 2 MG/ML IV SOLN
2.0000 mg | INTRAVENOUS | Status: DC | PRN
  Administered 2021-02-17: 4 mg via INTRAVENOUS
  Administered 2021-02-17: 2 mg via INTRAVENOUS
  Administered 2021-02-18: 4 mg via INTRAVENOUS
  Administered 2021-02-18: 2 mg via INTRAVENOUS
  Administered 2021-02-18 (×2): 4 mg via INTRAVENOUS
  Administered 2021-02-19 (×2): 2 mg via INTRAVENOUS
  Administered 2021-02-19: 4 mg via INTRAVENOUS
  Administered 2021-02-20: 2 mg via INTRAVENOUS
  Administered 2021-02-21: 4 mg via INTRAVENOUS
  Filled 2021-02-17 (×3): qty 2
  Filled 2021-02-17 (×6): qty 1
  Filled 2021-02-17 (×2): qty 2

## 2021-02-17 MED ORDER — ACETAMINOPHEN 500 MG PO TABS
1000.0000 mg | ORAL_TABLET | Freq: Four times a day (QID) | ORAL | Status: DC
  Administered 2021-02-17 – 2021-02-21 (×13): 1000 mg via ORAL
  Filled 2021-02-17 (×15): qty 2

## 2021-02-17 MED ORDER — METHOCARBAMOL 500 MG PO TABS
500.0000 mg | ORAL_TABLET | Freq: Three times a day (TID) | ORAL | Status: DC
  Administered 2021-02-17 – 2021-02-21 (×11): 500 mg via ORAL
  Filled 2021-02-17 (×12): qty 1

## 2021-02-17 NOTE — Progress Notes (Signed)
OT Cancellation Note  Patient Details Name: AERICA RINCON MRN: [id number] DOB: 10-Feb-1935   Cancelled Treatment:    Reason Eval/Treat Not Completed: Active bedrest order.  Bedrest with log rolling only.    Eber Jones., OTR/L Acute Rehabilitation Services Pager [phone] Office [phone]   Jeani Hawking M 02/17/2021, 12:02 PM

## 2021-02-17 NOTE — Progress Notes (Signed)
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 collection. Pelvic Organs: The uterus and bilateral ovaries are unremarkable. Lymph Nodes: No abdominal, pelvic, inguinal lymphadenopathy. Vasculature: Moderate severe atherosclerotic plaque. No abdominal aorta or iliac aneurysm. No active contrast extravasation or pseudoaneurysm. Musculoskeletal: Small right paravertebral/retroperitoneal hematoma (3:61). No acute pelvic fracture. Burst fracture of the L2 vertebral body with at least 70 percent height loss. Associated 6 mm retropulsion into the central canal leading to at least mild to moderate central canal stenosis. Compression fracture of the L1 vertebral body with greater than 35 percent height loss. Grade 1 anterolisthesis of L4 on L5. Sacral Tarlov cysts noted. IMPRESSION: 1. No acute intracranial abnormality. 2. No acute displaced fracture or traumatic listhesis of the cervical spine. 3. Burst fracture of the L2 vertebral body with 6 mm retropulsion into the central canal leading to at least moderate central canal stenosis. Recommend MRI lumbar spine for further evaluation of the spinal cord. 4. Compression fracture of the L1 vertebral body. 5. Nondisplaced sternal body fracture. 6. Question trace perivertebral/retroperitoneal  hematoma formation associated with the lumbar fracture. 7. Otherwise no  acute traumatic injury to the chest, abdomen, or pelvis. 8. A 6mm right lower lobe pulmonary nodule. Non-contrast chest CT at 6-12 months is recommended. If the nodule is stable at time of repeat CT, then future CT at 18-24 months (from today's scan) is considered optional for low-risk patients, but is recommended for high-risk patients. This recommendation follows the consensus statement: Guidelines for Management of Incidental Pulmonary Nodules Detected on CT Images: From the Fleischner Society 2017; Radiology 2017; 284:228-243. 9. Hyperplastic calcified left adrenal gland. 10. Aortic Atherosclerosis (ICD10-I70.0) and Emphysema (ICD10-J43.9). These results were called by telephone at the time of interpretation on 02/16/2021 at 7:01 pm to provider [facility] , who verbally acknowledged these results. Electronically Signed: By: Tish Frederickson M.D. On: 02/16/2021 19:26    Assessment/Plan: I spoke with her about her fracture.  We would like to avoid surgery if possible but I am concerned that she is going to need surgery.  It is a significant fracture with some stenosis.  Awaiting MRI.  Awaiting brace.  Flat bedrest for now.  Estimated body mass index is 23.41 kg/m as calculated from the following:   Height as of 08/09/20:  (1.575 m).   Weight as of 08/09/20: 58.1 kg.    LOS: 1 day    Tia Alert 02/17/2021, 8:48 AM

## 2021-02-17 NOTE — Progress Notes (Signed)
PT Cancellation Note  Patient Details Name: Shelby Arellano MRN: [id number] DOB: May 08, 1935   Cancelled Treatment:    Reason Eval/Treat Not Completed: Active bedrest order Per Neurosurgery notes, keep patient flat in bed until MRI results to determine course of treatment. PT will re-attempt eval as time allows.   Emelina Hinch A. Dan Humphreys PT, DPT Acute Rehabilitation Services Pager [phone] Office [phone]    [name] 02/17/2021, 7:46 AM
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
## 2021-02-17 NOTE — Progress Notes (Signed)
Pt was complaining of pain above her bladder. On assessment, the bladder was distended and pain was 10/10. Kinsinger MD paged and obtained verbal order for an external urinary catheter. Catheter was placed by Fuller Canada and Arvella Nigh. 350 ml of urine was removed.

## 2021-02-17 NOTE — Progress Notes (Signed)
Orthopedic Tech Progress Note Patient Details:  Shelby Arellano 12-Sep-1935 [id number] Left at bedside per DR. Request  Ortho Devices Type of Ortho Device: Other (comment) Ortho Device/Splint Location: TLSO Back brace Ortho Device/Splint Interventions: Ordered,Adjustment   Post Interventions Patient Tolerated: Well   Chonda Baney A Sheldon Sem 02/17/2021, 9:41 AM
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
## 2021-02-18 LAB — URINALYSIS, ROUTINE W REFLEX MICROSCOPIC
Bacteria, UA: NONE SEEN
Bilirubin Urine: NEGATIVE
Glucose, UA: NEGATIVE mg/dL
Hgb urine dipstick: NEGATIVE
Ketones, ur: NEGATIVE mg/dL
Nitrite: NEGATIVE
Protein, ur: NEGATIVE mg/dL
Specific Gravity, Urine: 1.004 — ABNORMAL LOW (ref 1.005–1.030)
pH: 6 (ref 5.0–8.0)

## 2021-02-18 LAB — CBC
HCT: 35.6 % — ABNORMAL LOW (ref 36.0–46.0)
Hemoglobin: 11.8 g/dL — ABNORMAL LOW (ref 12.0–15.0)
MCH: 31.9 pg (ref 26.0–34.0)
MCHC: 33.1 g/dL (ref 30.0–36.0)
MCV: 96.2 fL (ref 80.0–100.0)
Platelets: 141 10*3/uL — ABNORMAL LOW (ref 150–400)
RBC: 3.7 MIL/uL — ABNORMAL LOW (ref 3.87–5.11)
RDW: 14.1 % (ref 11.5–15.5)
WBC: 10.3 10*3/uL (ref 4.0–10.5)
nRBC: 0 % (ref 0.0–0.2)

## 2021-02-18 LAB — BASIC METABOLIC PANEL
Anion gap: 5 (ref 5–15)
BUN: 10 mg/dL (ref 8–23)
CO2: 23 mmol/L (ref 22–32)
Calcium: 8 mg/dL — ABNORMAL LOW (ref 8.9–10.3)
Chloride: 102 mmol/L (ref 98–111)
Creatinine, Ser: 0.83 mg/dL (ref 0.44–1.00)
GFR, Estimated: >60 mL/min (ref >60)
Glucose, Bld: 139 mg/dL — ABNORMAL HIGH (ref 70–99)
Potassium: 3.4 mmol/L — ABNORMAL LOW (ref 3.5–5.1)
Sodium: 130 mmol/L — ABNORMAL LOW (ref 135–145)

## 2021-02-18 MED ORDER — POTASSIUM CHLORIDE CRYS ER 20 MEQ PO TBCR
40.0000 meq | EXTENDED_RELEASE_TABLET | Freq: Once | ORAL | Status: AC
  Administered 2021-02-18: 40 meq via ORAL
  Filled 2021-02-18: qty 2

## 2021-02-18 MED ORDER — HEPARIN SODIUM (PORCINE) 5000 UNIT/ML IJ SOLN
5000.0000 [IU] | Freq: Three times a day (TID) | INTRAMUSCULAR | Status: DC
  Administered 2021-02-18 – 2021-02-19 (×3): 5000 [IU] via SUBCUTANEOUS
  Filled 2021-02-18 (×3): qty 1

## 2021-02-18 MED ORDER — TAMSULOSIN HCL 0.4 MG PO CAPS
0.4000 mg | ORAL_CAPSULE | Freq: Every day | ORAL | Status: DC
  Administered 2021-02-18 – 2021-02-26 (×9): 0.4 mg via ORAL
  Filled 2021-02-18 (×9): qty 1
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
## 2021-02-18 NOTE — Progress Notes (Signed)
The patient is feeling quite a bit better today.  She does have pain with movement.  She denies leg pain or numbness tingling or weakness.  She has some sternal pain.  I know her son and he is in the room today.  We have had a long discussion.  Have gone over the imaging with him.  We have talked about the difficulties of both surgical and nonsurgical management of the L2 fracture.  I am not confident that she will avoid surgical management given the stenosis and the severity of the fracture, but I do have concerns in an 86 year old with probable osteopenia doing a T10 or T11-L4 fusion.  I have gone over all of this with him in detail.  We spent more than 30 minutes in the room discussing everything.  They seemed satisfied.  Plan to slowly set her up and sit her on the side of the bed in the brace over the next couple of days.  We will see how she tolerates that.  I would not be surprised if we have to do an open reduction internal fixation of her L2 fracture sometime later in the week.

## 2021-02-18 NOTE — Progress Notes (Signed)
PT Cancellation Note  Patient Details Name: Shelby Arellano MRN: [id number] DOB: 08/24/1935   Cancelled Treatment:    Reason Eval/Treat Not Completed: Other (comment)   MRI done yesterday; Given results: at L1-2 L1-2 there is Retropulsed bone results in severe central canal stenosis at the level of the tip of the conus medullaris, likely compressing on the distal spinal cord,   Will await Neurosurgery's OK to mobilize;   Continuing to follow,   Van Clines, PT  Acute Rehabilitation Services Pager [phone] Office [phone]    Levi Aland 02/18/2021, 7:40 AM
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
## 2021-02-19 ENCOUNTER — Other Ambulatory Visit: Payer: Self-pay | Admitting: Neurological Surgery

## 2021-02-19 DIAGNOSIS — S32000A Wedge compression fracture of unspecified lumbar vertebra, initial encounter for closed fracture: Secondary | ICD-10-CM

## 2021-02-19 LAB — BASIC METABOLIC PANEL
Anion gap: 8 (ref 5–15)
BUN: 9 mg/dL (ref 8–23)
CO2: 24 mmol/L (ref 22–32)
Calcium: 8.2 mg/dL — ABNORMAL LOW (ref 8.9–10.3)
Chloride: 104 mmol/L (ref 98–111)
Creatinine, Ser: 0.81 mg/dL (ref 0.44–1.00)
GFR, Estimated: >60 mL/min (ref >60)
Glucose, Bld: 122 mg/dL — ABNORMAL HIGH (ref 70–99)
Potassium: 3.8 mmol/L (ref 3.5–5.1)
Sodium: 136 mmol/L (ref 135–145)

## 2021-02-19 MED ORDER — HEPARIN SODIUM (PORCINE) 5000 UNIT/ML IJ SOLN
5000.0000 [IU] | Freq: Three times a day (TID) | INTRAMUSCULAR | Status: AC
  Administered 2021-02-19 – 2021-02-20 (×5): 5000 [IU] via SUBCUTANEOUS
  Filled 2021-02-19 (×5): qty 1
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
## 2021-02-19 NOTE — Progress Notes (Addendum)
PT reported a 30 second syncopal episode while patient was setting on side of bed. PA Tresa Endo made aware, new orders placed, vitals taken. Patient is A&O x 4, and is in no distress.

## 2021-02-19 NOTE — Progress Notes (Signed)
Inpatient Rehab Admissions Coordinator Note:   Per PT/OT recommendations, pt was screened for CIR candidacy by Wolfgang Phoenix, MS, CCC-SLP.  At this time we are not recommending an inpatient rehab consult. Pt pending surgery on Wednesday.  Will rescreen after therapy reevaluations.   Please contact me with questions.     Wolfgang Phoenix, MS, CCC-SLP Admissions Coordinator [phone] 02/19/21 12:17 PM
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
## 2021-02-19 NOTE — Progress Notes (Signed)
Foley was removed as per MD order at 0630, awaiting void post foley removal
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
## 2021-02-20 MED ORDER — GLYCERIN (LAXATIVE) 2 G RE SUPP
1.0000 | Freq: Once | RECTAL | Status: AC
  Administered 2021-02-20: 1 via RECTAL
  Filled 2021-02-20 (×2): qty 1

## 2021-02-20 MED ORDER — GLYCERIN (LAXATIVE) 2 G RE SUPP
1.0000 | Freq: Every day | RECTAL | Status: DC | PRN
  Filled 2021-02-20 (×2): qty 1

## 2021-02-20 NOTE — Plan of Care (Signed)
  Problem: Health Behavior/Discharge Planning: Goal: Ability to manage health-related needs will improve Outcome: Progressing   Problem: Clinical Measurements: Goal: Ability to maintain clinical measurements within normal limits will improve Outcome: Progressing   Problem: Activity: Goal: Risk for activity intolerance will decrease Outcome: Progressing   Problem: Pain Managment: Goal: General experience of comfort will improve Outcome: Progressing   Problem: Safety: Goal: Ability to remain free from injury will improve Outcome: Progressing   Problem: Skin Integrity: Goal: Risk for impaired skin integrity will decrease Outcome: Progressing   
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
## 2021-02-20 NOTE — Progress Notes (Signed)
Subjective: Patient reports moderate back pain, feels constipated   Objective: Vital signs in last 24 hours: Temp:  [98.1 F (36.7 C)-99.1 F (37.3 C)] 98.2 F (36.8 C) (04/05 0401) Pulse Rate:  [73-94] 75 (04/05 0401) Resp:  [14-20] 20 (04/05 0552) BP: (128-161)/(61-88) 133/88 (04/05 0401) SpO2:  [97 %-100 %] 98 % (04/05 0401)  Intake/Output from previous day: 04/04 0701 - 04/05 0700 In: 2291.1 [P.O.:740; I.V.:1551.1] Out: 1050 [Urine:1050] Intake/Output this shift: No intake/output data recorded.  Neurologic: Grossly normal  Lab Results: Lab Results  Component Value Date   WBC 10.3 02/18/2021   HGB 11.8 (L) 02/18/2021   HCT 35.6 (L) 02/18/2021   MCV 96.2 02/18/2021   PLT 141 (L) 02/18/2021   Lab Results  Component Value Date   INR 0.9 02/16/2021   BMET Lab Results  Component Value Date   NA 136 02/19/2021   K 3.8 02/19/2021   CL 104 02/19/2021   CO2 24 02/19/2021   GLUCOSE 122 (H) 02/19/2021   BUN 9 02/19/2021   CREATININE 0.81 02/19/2021   CALCIUM 8.2 (L) 02/19/2021    Studies/Results: No results found.  Assessment/Plan: Doing ok, has not been able to tolerate sitting up in bed. Therapy worked with her yesterday. We discussed the risks and benefits associated with the surgery planned for tomorrow. She understood and agrees to move forward .   LOS: 4 days    Tiana Loft [facility] 02/20/2021, 7:44 AM

## 2021-02-21 ENCOUNTER — Encounter (HOSPITAL_COMMUNITY): Admission: EM | Disposition: A | Discharge status: Rehabilitation Facility | Payer: Self-pay | Source: Home / Self Care

## 2021-02-21 ENCOUNTER — Inpatient Hospital Stay (HOSPITAL_COMMUNITY): Payer: No Typology Code available for payment source

## 2021-02-21 ENCOUNTER — Inpatient Hospital Stay (HOSPITAL_COMMUNITY): Payer: No Typology Code available for payment source | Admitting: Anesthesiology

## 2021-02-21 ENCOUNTER — Encounter (HOSPITAL_COMMUNITY): Payer: Self-pay

## 2021-02-21 DIAGNOSIS — Z981 Arthrodesis status: Secondary | ICD-10-CM

## 2021-02-21 LAB — CBC
HCT: 33.1 % — ABNORMAL LOW (ref 36.0–46.0)
Hemoglobin: 11.2 g/dL — ABNORMAL LOW (ref 12.0–15.0)
MCH: 31.1 pg (ref 26.0–34.0)
MCHC: 33.8 g/dL (ref 30.0–36.0)
MCV: 91.9 fL (ref 80.0–100.0)
Platelets: 186 10*3/uL (ref 150–400)
RBC: 3.6 MIL/uL — ABNORMAL LOW (ref 3.87–5.11)
RDW: 14.1 % (ref 11.5–15.5)
WBC: 5.5 10*3/uL (ref 4.0–10.5)
nRBC: 0 % (ref 0.0–0.2)

## 2021-02-21 LAB — CBC WITH DIFFERENTIAL/PLATELET
Abs Immature Granulocytes: 0.02 10*3/uL (ref 0.00–0.07)
Basophils Absolute: 0 10*3/uL (ref 0.0–0.1)
Basophils Relative: 1 %
Eosinophils Absolute: 0.2 10*3/uL (ref 0.0–0.5)
Eosinophils Relative: 3 %
HCT: 34.6 % — ABNORMAL LOW (ref 36.0–46.0)
Hemoglobin: 12 g/dL (ref 12.0–15.0)
Immature Granulocytes: 0 %
Lymphocytes Relative: 24 %
Lymphs Abs: 1.3 10*3/uL (ref 0.7–4.0)
MCH: 31.8 pg (ref 26.0–34.0)
MCHC: 34.7 g/dL (ref 30.0–36.0)
MCV: 91.8 fL (ref 80.0–100.0)
Monocytes Absolute: 0.6 10*3/uL (ref 0.1–1.0)
Monocytes Relative: 10 %
Neutro Abs: 3.4 10*3/uL (ref 1.7–7.7)
Neutrophils Relative %: 62 %
Platelets: 200 10*3/uL (ref 150–400)
RBC: 3.77 MIL/uL — ABNORMAL LOW (ref 3.87–5.11)
RDW: 14.2 % (ref 11.5–15.5)
WBC: 5.5 10*3/uL (ref 4.0–10.5)
nRBC: 0 % (ref 0.0–0.2)

## 2021-02-21 LAB — GLUCOSE, CAPILLARY: Glucose-Capillary: 108 mg/dL — ABNORMAL HIGH (ref 70–99)

## 2021-02-21 LAB — BASIC METABOLIC PANEL
Anion gap: 5 (ref 5–15)
BUN: 8 mg/dL (ref 8–23)
CO2: 24 mmol/L (ref 22–32)
Calcium: 8.2 mg/dL — ABNORMAL LOW (ref 8.9–10.3)
Chloride: 107 mmol/L (ref 98–111)
Creatinine, Ser: 0.8 mg/dL (ref 0.44–1.00)
GFR, Estimated: >60 mL/min (ref >60)
Glucose, Bld: 109 mg/dL — ABNORMAL HIGH (ref 70–99)
Potassium: 3.3 mmol/L — ABNORMAL LOW (ref 3.5–5.1)
Sodium: 136 mmol/L (ref 135–145)

## 2021-02-21 LAB — TYPE AND SCREEN
ABO/RH(D): O POS
Antibody Screen: NEGATIVE

## 2021-02-21 LAB — PROTIME-INR
INR: 0.9 (ref 0.8–1.2)
Prothrombin Time: 12 seconds (ref 11.4–15.2)

## 2021-02-21 LAB — ABO/RH: ABO/RH(D): O POS

## 2021-02-21 LAB — MRSA PCR SCREENING: MRSA by PCR: NEGATIVE

## 2021-02-21 SURGERY — POSTERIOR LUMBAR FUSION 2 LEVEL
Anesthesia: General | Site: Back

## 2021-02-21 MED ORDER — FENTANYL CITRATE (PF) 250 MCG/5ML IJ SOLN
INTRAMUSCULAR | Status: AC
  Filled 2021-02-21: qty 5

## 2021-02-21 MED ORDER — METHOCARBAMOL 500 MG PO TABS
500.0000 mg | ORAL_TABLET | Freq: Four times a day (QID) | ORAL | Status: DC | PRN
  Administered 2021-02-21 – 2021-02-22 (×2): 500 mg via ORAL
  Filled 2021-02-21 (×2): qty 1

## 2021-02-21 MED ORDER — ROCURONIUM BROMIDE 10 MG/ML (PF) SYRINGE
PREFILLED_SYRINGE | INTRAVENOUS | Status: DC | PRN
  Administered 2021-02-21: 10 mg via INTRAVENOUS
  Administered 2021-02-21: 20 mg via INTRAVENOUS
  Administered 2021-02-21: 50 mg via INTRAVENOUS

## 2021-02-21 MED ORDER — ONDANSETRON HCL 4 MG/2ML IJ SOLN: 4.0000 mg | Freq: Four times a day (QID) | INTRAMUSCULAR | Status: DC | PRN

## 2021-02-21 MED ORDER — BUPIVACAINE HCL (PF) 0.25 % IJ SOLN
INTRAMUSCULAR | Status: AC
  Filled 2021-02-21: qty 30

## 2021-02-21 MED ORDER — MENTHOL 3 MG MT LOZG: 1.0000 | LOZENGE | OROMUCOSAL | Status: DC | PRN

## 2021-02-21 MED ORDER — EPHEDRINE 5 MG/ML INJ
INTRAVENOUS | Status: AC
  Filled 2021-02-21: qty 10

## 2021-02-21 MED ORDER — MORPHINE SULFATE (PF) 2 MG/ML IV SOLN
2.0000 mg | INTRAVENOUS | Status: DC | PRN
  Administered 2021-02-22 – 2021-02-24 (×4): 2 mg via INTRAVENOUS
  Filled 2021-02-21 (×4): qty 1

## 2021-02-21 MED ORDER — GABAPENTIN 300 MG PO CAPS
300.0000 mg | ORAL_CAPSULE | ORAL | Status: AC
  Administered 2021-02-21: 300 mg via ORAL
  Filled 2021-02-21: qty 1

## 2021-02-21 MED ORDER — CHLORHEXIDINE GLUCONATE CLOTH 2 % EX PADS
6.0000 | MEDICATED_PAD | Freq: Once | CUTANEOUS | Status: AC
  Administered 2021-02-21: 6 via TOPICAL

## 2021-02-21 MED ORDER — CHLORHEXIDINE GLUCONATE 0.12 % MT SOLN
15.0000 mL | OROMUCOSAL | Status: AC
  Administered 2021-02-21: 15 mL via OROMUCOSAL
  Filled 2021-02-21: qty 15

## 2021-02-21 MED ORDER — CELECOXIB 200 MG PO CAPS
200.0000 mg | ORAL_CAPSULE | Freq: Two times a day (BID) | ORAL | Status: DC
  Administered 2021-02-21: 200 mg via ORAL
  Filled 2021-02-21 (×2): qty 1

## 2021-02-21 MED ORDER — ACETAMINOPHEN 650 MG RE SUPP: 650.0000 mg | RECTAL | Status: DC | PRN

## 2021-02-21 MED ORDER — MORPHINE SULFATE (PF) 2 MG/ML IV SOLN
INTRAVENOUS | Status: AC
  Administered 2021-02-21: 2 mg via INTRAVENOUS
  Filled 2021-02-21: qty 1

## 2021-02-21 MED ORDER — PHENYLEPHRINE 40 MCG/ML (10ML) SYRINGE FOR IV PUSH (FOR BLOOD PRESSURE SUPPORT)
PREFILLED_SYRINGE | INTRAVENOUS | Status: DC | PRN
  Administered 2021-02-21: 40 ug via INTRAVENOUS
  Administered 2021-02-21: 80 ug via INTRAVENOUS
  Administered 2021-02-21: 40 ug via INTRAVENOUS
  Administered 2021-02-21: 80 ug via INTRAVENOUS

## 2021-02-21 MED ORDER — EPHEDRINE SULFATE-NACL 50-0.9 MG/10ML-% IV SOSY
PREFILLED_SYRINGE | INTRAVENOUS | Status: DC | PRN
  Administered 2021-02-21 (×3): 5 mg via INTRAVENOUS

## 2021-02-21 MED ORDER — ACETAMINOPHEN 325 MG PO TABS: 650.0000 mg | ORAL_TABLET | ORAL | Status: DC | PRN

## 2021-02-21 MED ORDER — THROMBIN 20000 UNITS EX SOLR: CUTANEOUS | Status: DC | PRN

## 2021-02-21 MED ORDER — ACETAMINOPHEN 500 MG PO TABS
1000.0000 mg | ORAL_TABLET | ORAL | Status: AC
  Administered 2021-02-21: 1000 mg via ORAL
  Filled 2021-02-21: qty 2

## 2021-02-21 MED ORDER — ONDANSETRON HCL 4 MG PO TABS: 4.0000 mg | ORAL_TABLET | Freq: Four times a day (QID) | ORAL | Status: DC | PRN

## 2021-02-21 MED ORDER — POTASSIUM CHLORIDE IN NACL 20-0.9 MEQ/L-% IV SOLN
INTRAVENOUS | Status: DC
  Filled 2021-02-21 (×6): qty 1000

## 2021-02-21 MED ORDER — PROPOFOL 10 MG/ML IV BOLUS
INTRAVENOUS | Status: DC | PRN
  Administered 2021-02-21: 100 mg via INTRAVENOUS

## 2021-02-21 MED ORDER — PHENYLEPHRINE 40 MCG/ML (10ML) SYRINGE FOR IV PUSH (FOR BLOOD PRESSURE SUPPORT)
PREFILLED_SYRINGE | INTRAVENOUS | Status: AC
  Filled 2021-02-21: qty 30

## 2021-02-21 MED ORDER — 0.9 % SODIUM CHLORIDE (POUR BTL) OPTIME
TOPICAL | Status: DC | PRN
  Administered 2021-02-21: 1000 mL

## 2021-02-21 MED ORDER — METHOCARBAMOL 500 MG PO TABS
ORAL_TABLET | ORAL | Status: AC
  Filled 2021-02-21: qty 1

## 2021-02-21 MED ORDER — MORPHINE SULFATE (PF) 4 MG/ML IV SOLN
INTRAVENOUS | Status: AC
  Filled 2021-02-21: qty 1

## 2021-02-21 MED ORDER — LACTATED RINGERS IV SOLN: INTRAVENOUS | Status: DC | PRN

## 2021-02-21 MED ORDER — THROMBIN 20000 UNITS EX SOLR
CUTANEOUS | Status: AC
  Filled 2021-02-21: qty 20000

## 2021-02-21 MED ORDER — ROCURONIUM BROMIDE 10 MG/ML (PF) SYRINGE
PREFILLED_SYRINGE | INTRAVENOUS | Status: AC
  Filled 2021-02-21: qty 30

## 2021-02-21 MED ORDER — MORPHINE SULFATE (PF) 2 MG/ML IV SOLN
1.0000 mg | INTRAVENOUS | Status: DC | PRN
  Administered 2021-02-21 (×2): 2 mg via INTRAVENOUS

## 2021-02-21 MED ORDER — THROMBIN 5000 UNITS EX SOLR: OROMUCOSAL | Status: DC | PRN

## 2021-02-21 MED ORDER — ALBUMIN HUMAN 5 % IV SOLN: INTRAVENOUS | Status: DC | PRN

## 2021-02-21 MED ORDER — FENTANYL CITRATE (PF) 100 MCG/2ML IJ SOLN
INTRAMUSCULAR | Status: AC
  Filled 2021-02-21: qty 2

## 2021-02-21 MED ORDER — LIDOCAINE 2% (20 MG/ML) 5 ML SYRINGE
INTRAMUSCULAR | Status: AC
  Filled 2021-02-21: qty 15

## 2021-02-21 MED ORDER — PHENYLEPHRINE HCL-NACL 10-0.9 MG/250ML-% IV SOLN
INTRAVENOUS | Status: DC | PRN
  Administered 2021-02-21: 25 ug/min via INTRAVENOUS

## 2021-02-21 MED ORDER — EPHEDRINE 5 MG/ML INJ
INTRAVENOUS | Status: AC
  Filled 2021-02-21: qty 20

## 2021-02-21 MED ORDER — DEXMEDETOMIDINE HCL IN NACL 200 MCG/50ML IV SOLN
INTRAVENOUS | Status: DC | PRN
  Administered 2021-02-21: 20 ug via INTRAVENOUS

## 2021-02-21 MED ORDER — THROMBIN 5000 UNITS EX SOLR
CUTANEOUS | Status: AC
  Filled 2021-02-21: qty 5000

## 2021-02-21 MED ORDER — LIDOCAINE 2% (20 MG/ML) 5 ML SYRINGE
INTRAMUSCULAR | Status: DC | PRN
  Administered 2021-02-21: 30 mg via INTRAVENOUS

## 2021-02-21 MED ORDER — ONDANSETRON HCL 4 MG/2ML IJ SOLN
INTRAMUSCULAR | Status: AC
  Filled 2021-02-21: qty 6

## 2021-02-21 MED ORDER — DEXAMETHASONE SODIUM PHOSPHATE 10 MG/ML IJ SOLN
INTRAMUSCULAR | Status: AC
  Filled 2021-02-21: qty 3

## 2021-02-21 MED ORDER — SODIUM CHLORIDE 0.9% FLUSH
3.0000 mL | Freq: Two times a day (BID) | INTRAVENOUS | Status: DC
  Administered 2021-02-22 – 2021-02-23 (×3): 3 mL via INTRAVENOUS

## 2021-02-21 MED ORDER — CEFAZOLIN SODIUM-DEXTROSE 2-4 GM/100ML-% IV SOLN
2.0000 g | INTRAVENOUS | Status: AC
  Administered 2021-02-21: 2 g via INTRAVENOUS
  Filled 2021-02-21: qty 100

## 2021-02-21 MED ORDER — METHOCARBAMOL 1000 MG/10ML IJ SOLN
500.0000 mg | Freq: Four times a day (QID) | INTRAVENOUS | Status: DC | PRN
  Filled 2021-02-21: qty 5

## 2021-02-21 MED ORDER — MORPHINE SULFATE (PF) 4 MG/ML IV SOLN
INTRAVENOUS | Status: AC
  Administered 2021-02-21: 2 mg
  Filled 2021-02-21: qty 1

## 2021-02-21 MED ORDER — LACTATED RINGERS IV SOLN: INTRAVENOUS | Status: DC

## 2021-02-21 MED ORDER — OXYCODONE HCL 5 MG PO TABS: 5.0000 mg | ORAL_TABLET | ORAL | Status: DC | PRN

## 2021-02-21 MED ORDER — SODIUM CHLORIDE 0.9% FLUSH: 3.0000 mL | INTRAVENOUS | Status: DC | PRN

## 2021-02-21 MED ORDER — SODIUM CHLORIDE 0.9 % IV SOLN: 250.0000 mL | INTRAVENOUS | Status: DC

## 2021-02-21 MED ORDER — CEFAZOLIN SODIUM-DEXTROSE 2-4 GM/100ML-% IV SOLN
2.0000 g | Freq: Three times a day (TID) | INTRAVENOUS | Status: AC
  Administered 2021-02-22 (×2): 2 g via INTRAVENOUS
  Filled 2021-02-21 (×2): qty 100

## 2021-02-21 MED ORDER — BUPIVACAINE HCL (PF) 0.25 % IJ SOLN
INTRAMUSCULAR | Status: DC | PRN
  Administered 2021-02-21: 10 mL

## 2021-02-21 MED ORDER — PHENOL 1.4 % MT LIQD: 1.0000 | OROMUCOSAL | Status: DC | PRN

## 2021-02-21 MED ORDER — SENNA 8.6 MG PO TABS
1.0000 | ORAL_TABLET | Freq: Two times a day (BID) | ORAL | Status: DC
  Administered 2021-02-22 – 2021-02-26 (×5): 8.6 mg via ORAL
  Filled 2021-02-21 (×7): qty 1

## 2021-02-21 MED ORDER — PROPOFOL 10 MG/ML IV BOLUS
INTRAVENOUS | Status: AC
  Filled 2021-02-21: qty 20

## 2021-02-21 SURGICAL SUPPLY — 64 items
ADH SKN CLS APL DERMABOND .7 (GAUZE/BANDAGES/DRESSINGS) ×2
APL SKNCLS STERI-STRIP NONHPOA (GAUZE/BANDAGES/DRESSINGS) ×1
BASKET BONE COLLECTION (BASKET) ×2 IMPLANT
BENZOIN TINCTURE PRP APPL 2/3 (GAUZE/BANDAGES/DRESSINGS) ×2 IMPLANT
BUR CARBIDE MATCH 3.0 (BURR) ×2 IMPLANT
CANISTER SUCT 3000ML PPV (MISCELLANEOUS) ×2 IMPLANT
CNTNR URN SCR LID CUP LEK RST (MISCELLANEOUS) ×1 IMPLANT
CONT SPEC 4OZ STRL OR WHT (MISCELLANEOUS) ×2
COVER BACK TABLE 60X90IN (DRAPES) ×2 IMPLANT
DERMABOND ADVANCED (GAUZE/BANDAGES/DRESSINGS) ×2
DERMABOND ADVANCED .7 DNX12 (GAUZE/BANDAGES/DRESSINGS) ×1 IMPLANT
DIFFUSER DRILL AIR PNEUMATIC (MISCELLANEOUS) ×2 IMPLANT
DRAPE C-ARM 42X72 X-RAY (DRAPES) ×3 IMPLANT
DRAPE C-ARMOR (DRAPES) ×1 IMPLANT
DRAPE LAPAROTOMY 100X72X124 (DRAPES) ×2 IMPLANT
DRAPE SURG 17X23 STRL (DRAPES) ×2 IMPLANT
DRSG OPSITE POSTOP 4X10 (GAUZE/BANDAGES/DRESSINGS) ×1 IMPLANT
DURAPREP 26ML APPLICATOR (WOUND CARE) ×2 IMPLANT
ELECT REM PT RETURN 9FT ADLT (ELECTROSURGICAL) ×2
ELECTRODE REM PT RTRN 9FT ADLT (ELECTROSURGICAL) ×1 IMPLANT
EVACUATOR 1/8 PVC DRAIN (DRAIN) ×2 IMPLANT
FIBER BOAT ALLOFUSE 15CC (Bone Implant) ×2 IMPLANT
GAUZE 4X4 16PLY RFD (DISPOSABLE) IMPLANT
GLOVE BIO SURGEON STRL SZ7 (GLOVE) ×1 IMPLANT
GLOVE BIO SURGEON STRL SZ8 (GLOVE) ×4 IMPLANT
GLOVE SURG POLYISO LF SZ7.5 (GLOVE) ×5 IMPLANT
GLOVE SURG UNDER POLY LF SZ7 (GLOVE) ×1 IMPLANT
GOWN STRL REUS W/ TWL LRG LVL3 (GOWN DISPOSABLE) IMPLANT
GOWN STRL REUS W/ TWL XL LVL3 (GOWN DISPOSABLE) ×2 IMPLANT
GOWN STRL REUS W/TWL 2XL LVL3 (GOWN DISPOSABLE) IMPLANT
GOWN STRL REUS W/TWL LRG LVL3 (GOWN DISPOSABLE) ×4
GOWN STRL REUS W/TWL XL LVL3 (GOWN DISPOSABLE) ×4
GRAFT BONE PROTEIOS LRG 5CC (Orthopedic Implant) ×2 IMPLANT
HEMOSTAT POWDER KIT SURGIFOAM (HEMOSTASIS) ×1 IMPLANT
KIT BASIN OR (CUSTOM PROCEDURE TRAY) ×2 IMPLANT
KIT TURNOVER KIT B (KITS) ×2 IMPLANT
MILL MEDIUM DISP (BLADE) IMPLANT
NDL HYPO 18GX1.5 BLUNT FILL (NEEDLE) IMPLANT
NDL HYPO 25X1 1.5 SAFETY (NEEDLE) ×1 IMPLANT
NEEDLE HYPO 18GX1.5 BLUNT FILL (NEEDLE) ×2 IMPLANT
NEEDLE HYPO 25X1 1.5 SAFETY (NEEDLE) ×2 IMPLANT
NS IRRIG 1000ML POUR BTL (IV SOLUTION) ×2 IMPLANT
PACK LAMINECTOMY NEURO (CUSTOM PROCEDURE TRAY) ×2 IMPLANT
PAD ARMBOARD 7.5X6 YLW CONV (MISCELLANEOUS) ×6 IMPLANT
ROD TI STRAIGHT 5.5X500 (Rod) ×1 IMPLANT
SCREW CANC SHANK MOD 5.5X35 (Screw) ×1 IMPLANT
SCREW POLYAXIAL TULIP (Screw) ×12 IMPLANT
SCREW SHANK MOD 5.5X40 (Screw) ×7 IMPLANT
SCREW SHANK MOD 5.5X45 (Screw) ×4 IMPLANT
SET SCREW (Screw) ×24 IMPLANT
SET SCREW SPNE (Screw) IMPLANT
SPONGE LAP 4X18 RFD (DISPOSABLE) IMPLANT
SPONGE SURGIFOAM ABS GEL 100 (HEMOSTASIS) ×2 IMPLANT
STRIP CLOSURE SKIN 1/2X4 (GAUZE/BANDAGES/DRESSINGS) ×4 IMPLANT
SUT VIC AB 0 CT1 18XCR BRD8 (SUTURE) ×1 IMPLANT
SUT VIC AB 0 CT1 8-18 (SUTURE) ×6
SUT VIC AB 2-0 CP2 18 (SUTURE) ×3 IMPLANT
SUT VIC AB 3-0 SH 8-18 (SUTURE) ×6 IMPLANT
SYR 5ML LL (SYRINGE) ×1 IMPLANT
SYR CONTROL 10ML LL (SYRINGE) ×2 IMPLANT
TOWEL GREEN STERILE (TOWEL DISPOSABLE) ×2 IMPLANT
TOWEL GREEN STERILE FF (TOWEL DISPOSABLE) ×2 IMPLANT
TRAY FOLEY MTR SLVR 16FR STAT (SET/KITS/TRAYS/PACK) ×1 IMPLANT
WATER STERILE IRR 1000ML POUR (IV SOLUTION) ×2 IMPLANT
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
## 2021-02-21 NOTE — Progress Notes (Signed)
Plan is for T10 or T11-L4 open reduction internal fixation of L2 burst fracture with decompression at the L2 level for the central stenosis.  We have spoken to the patient.  She has demonstrated understanding of the plan of surgery.  She is ready to move forward.

## 2021-02-21 NOTE — Anesthesia Preprocedure Evaluation (Signed)
Anesthesia Evaluation  Patient identified by MRN, date of birth, ID band Patient awake    Reviewed: Allergy & Precautions, NPO status , Patient's Chart, lab work & pertinent test results  Airway Mallampati: II  TM Distance: >3 FB Neck ROM: Full    Dental  (+) Teeth Intact, Dental Advisory Given   Pulmonary    breath sounds clear to auscultation       Cardiovascular  Rhythm:Regular Rate:Normal     Neuro/Psych    GI/Hepatic   Endo/Other    Renal/GU      Musculoskeletal   Abdominal   Peds  Hematology   Anesthesia Other Findings   Reproductive/Obstetrics                             Anesthesia Physical Anesthesia Plan  ASA: III  Anesthesia Plan: General   Post-op Pain Management:    Induction: Intravenous  PONV Risk Score and Plan: Ondansetron  Airway Management Planned: Oral ETT  Additional Equipment:   Intra-op Plan:   Post-operative Plan: Extubation in OR  Informed Consent: I have reviewed the patients History and Physical, chart, labs and discussed the procedure including the risks, benefits and alternatives for the proposed anesthesia with the patient or authorized representative who has indicated his/her understanding and acceptance.     Dental advisory given  Plan Discussed with: CRNA and Anesthesiologist  Anesthesia Plan Comments:         Anesthesia Quick Evaluation
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
## 2021-02-21 NOTE — Transfer of Care (Signed)
Immediate Anesthesia Transfer of Care Note  Patient: Shelby Arellano  Procedure(s) Performed: Open reduction internal fixation of Lumbar two fracture with Thoracic eleven-Lumbar four Fixation (N/A Back)  Patient Location: PACU  Anesthesia Type:General  Level of Consciousness: awake and alert   Airway & Oxygen Therapy: Patient Spontanous Breathing and Patient connected to nasal cannula oxygen  Post-op Assessment: Report given to RN and Post -op Vital signs reviewed and stable  Post vital signs: Reviewed and stable  Last Vitals:  Vitals Value Taken Time  BP 129/66 02/21/21 2005  Temp    Pulse 86 02/21/21 2008  Resp 19 02/21/21 2008  SpO2 97 % 02/21/21 2008  Vitals shown include unvalidated device data.  Last Pain:  Vitals:   02/21/21 1523  TempSrc: Oral  PainSc:       Patients Stated Pain Goal: 1 (02/20/21 1100)  Complications: No complications documented.
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
## 2021-02-22 LAB — CBC
HCT: 31.6 % — ABNORMAL LOW (ref 36.0–46.0)
Hemoglobin: 10.6 g/dL — ABNORMAL LOW (ref 12.0–15.0)
MCH: 32.2 pg (ref 26.0–34.0)
MCHC: 33.5 g/dL (ref 30.0–36.0)
MCV: 96 fL (ref 80.0–100.0)
Platelets: 183 10*3/uL (ref 150–400)
RBC: 3.29 MIL/uL — ABNORMAL LOW (ref 3.87–5.11)
RDW: 14.8 % (ref 11.5–15.5)
WBC: 8.4 10*3/uL (ref 4.0–10.5)
nRBC: 0 % (ref 0.0–0.2)

## 2021-02-22 LAB — BASIC METABOLIC PANEL
Anion gap: 11 (ref 5–15)
BUN: 10 mg/dL (ref 8–23)
CO2: 20 mmol/L — ABNORMAL LOW (ref 22–32)
Calcium: 8.1 mg/dL — ABNORMAL LOW (ref 8.9–10.3)
Chloride: 102 mmol/L (ref 98–111)
Creatinine, Ser: 0.79 mg/dL (ref 0.44–1.00)
GFR, Estimated: >60 mL/min (ref >60)
Glucose, Bld: 100 mg/dL — ABNORMAL HIGH (ref 70–99)
Potassium: 3.8 mmol/L (ref 3.5–5.1)
Sodium: 133 mmol/L — ABNORMAL LOW (ref 135–145)

## 2021-02-22 MED ORDER — OXYCODONE HCL 5 MG PO TABS: 2.5000 mg | ORAL_TABLET | ORAL | Status: DC | PRN

## 2021-02-22 MED ORDER — NALOXEGOL OXALATE 12.5 MG PO TABS
12.5000 mg | ORAL_TABLET | Freq: Every day | ORAL | Status: DC
  Administered 2021-02-22 – 2021-02-26 (×2): 12.5 mg via ORAL
  Filled 2021-02-22 (×5): qty 1

## 2021-02-22 MED ORDER — KETOROLAC TROMETHAMINE 15 MG/ML IJ SOLN
15.0000 mg | Freq: Four times a day (QID) | INTRAMUSCULAR | Status: AC
  Administered 2021-02-22 – 2021-02-23 (×4): 15 mg via INTRAVENOUS
  Filled 2021-02-22 (×4): qty 1

## 2021-02-22 MED ORDER — ACETAMINOPHEN 500 MG PO TABS
1000.0000 mg | ORAL_TABLET | Freq: Three times a day (TID) | ORAL | Status: DC
  Administered 2021-02-22 – 2021-02-23 (×4): 1000 mg via ORAL
  Filled 2021-02-22 (×4): qty 2

## 2021-02-22 MED ORDER — CHLORHEXIDINE GLUCONATE CLOTH 2 % EX PADS
6.0000 | MEDICATED_PAD | Freq: Every day | CUTANEOUS | Status: DC
  Administered 2021-02-22 – 2021-02-26 (×5): 6 via TOPICAL

## 2021-02-22 MED ORDER — OXYCODONE HCL 5 MG PO TABS
5.0000 mg | ORAL_TABLET | ORAL | Status: DC | PRN
  Administered 2021-02-23 – 2021-02-26 (×11): 5 mg via ORAL
  Filled 2021-02-22 (×12): qty 1
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
## 2021-02-22 NOTE — Progress Notes (Signed)
Subjective: Patient reports significant incisional pain, constant ache in the back, no leg pain or NTW  Objective: Vital signs in last 24 hours: Temp:  [97.7 F (36.5 C)-99.3 F (37.4 C)] 98.6 F (37 C) (04/07 0443) Pulse Rate:  [68-103] 96 (04/07 0443) Resp:  [10-20] 20 (04/07 0443) BP: (119-168)/(62-82) 139/76 (04/07 0443) SpO2:  [96 %-100 %] 99 % (04/07 0443) Weight:  [62 kg] 62 kg (04/06 1554)  Intake/Output from previous day: 04/06 0701 - 04/07 0700 In: 2800 [P.O.:220; I.V.:2232.4; IV Piggyback:347.6] Out: 2575 [Urine:2025; Drains:150; Blood:400] Intake/Output this shift: No intake/output data recorded.  good leg strength, awake , alert, conversant, drinking water  Lab Results: Lab Results  Component Value Date   WBC 5.5 02/21/2021   HGB 12.0 02/21/2021   HCT 34.6 (L) 02/21/2021   MCV 91.8 02/21/2021   PLT 200 02/21/2021   Lab Results  Component Value Date   INR 0.9 02/21/2021   BMET Lab Results  Component Value Date   NA 136 02/21/2021   K 3.3 (L) 02/21/2021   CL 107 02/21/2021   CO2 24 02/21/2021   GLUCOSE 109 (H) 02/21/2021   BUN 8 02/21/2021   CREATININE 0.80 02/21/2021   CALCIUM 8.2 (L) 02/21/2021    Studies/Results: Chest 2 View  Result Date: 02/21/2021 CLINICAL DATA:  Preoperative evaluation for lumbar surgery. EXAM: CHEST - 2 VIEW COMPARISON:  12/10/2019 FINDINGS: Both lungs are clear. Heart and mediastinum are within normal limits. Atherosclerotic calcifications at the aortic arch. Trachea is midline. Negative for a pneumothorax. No large pleural effusions. IMPRESSION: 1. No acute cardiopulmonary disease. 2.  Aortic Atherosclerosis (ICD10-I70.0). Electronically Signed   By: Richarda Overlie M.D.   On: 02/21/2021 11:25   DG Thoracolumabar Spine  Result Date: 02/21/2021 CLINICAL DATA:  T11 through L4 fixation. EXAM: THORACOLUMBAR SPINE 1V COMPARISON:  None. FINDINGS: Intraoperative fluoroscopy is obtained for surgical control purposes. Fluoroscopy time is  recorded at 1 minute 55 seconds. Dose recorded at 48.72 mGy. Three spot fluoroscopic images are obtained. Spot fluoroscopic images obtained demonstrate posterior rod and screw fixation across 6 levels at the thoracolumbar junction, likely T11 through L4. Compression deformities are demonstrated at the presumed L1 and L2 vertebra. Surgical hardware appears intact. Retractors are present. IMPRESSION: Intraoperative fluoroscopy obtained for surgical control purposes. Electronically Signed   By: Burman Nieves M.D.   On: 02/21/2021 21:16   DG C-Arm 1-60 Min  Result Date: 02/21/2021 CLINICAL DATA:  T11 through L4 fixation. EXAM: THORACOLUMBAR SPINE 1V COMPARISON:  None. FINDINGS: Intraoperative fluoroscopy is obtained for surgical control purposes. Fluoroscopy time is recorded at 1 minute 55 seconds. Dose recorded at 48.72 mGy. Three spot fluoroscopic images are obtained. Spot fluoroscopic images obtained demonstrate posterior rod and screw fixation across 6 levels at the thoracolumbar junction, likely T11 through L4. Compression deformities are demonstrated at the presumed L1 and L2 vertebra. Surgical hardware appears intact. Retractors are present. IMPRESSION: Intraoperative fluoroscopy obtained for surgical control purposes. Electronically Signed   By: Burman Nieves M.D.   On: 02/21/2021 21:16    Assessment/Plan: Pain control and mobilization may be an issue. Will try short course of toradol.    Estimated body mass index is 24.21 kg/m as calculated from the following:   Height as of this encounter: 5\' 3"  (1.6 m).   Weight as of this encounter: 62 kg.    LOS: 6 days    02/22/2021, 8:16 AM

## 2021-02-22 NOTE — Progress Notes (Signed)
Pt refused mobility/turning or getting out of bed even after giving pain med.
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
## 2021-02-22 NOTE — Progress Notes (Signed)
Rehab Admissions Coordinator Note:  Patient was screened by Clois Dupes for appropriateness for an Inpatient Acute Rehab Consult per therapy recs. Pain limiting today. I will not place a rehab consult yet, until able to participate more for rehab venue assessment.  Clois Dupes RN MSN 02/22/2021, 11:50 AM  I can be reached at [phone].
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
## 2021-02-23 LAB — CBC
HCT: 28.5 % — ABNORMAL LOW (ref 36.0–46.0)
Hemoglobin: 9.6 g/dL — ABNORMAL LOW (ref 12.0–15.0)
MCH: 31.8 pg (ref 26.0–34.0)
MCHC: 33.7 g/dL (ref 30.0–36.0)
MCV: 94.4 fL (ref 80.0–100.0)
Platelets: 174 10*3/uL (ref 150–400)
RBC: 3.02 MIL/uL — ABNORMAL LOW (ref 3.87–5.11)
RDW: 14.8 % (ref 11.5–15.5)
WBC: 6.2 10*3/uL (ref 4.0–10.5)
nRBC: 0 % (ref 0.0–0.2)

## 2021-02-23 LAB — BASIC METABOLIC PANEL
Anion gap: 6 (ref 5–15)
BUN: 11 mg/dL (ref 8–23)
CO2: 23 mmol/L (ref 22–32)
Calcium: 8.1 mg/dL — ABNORMAL LOW (ref 8.9–10.3)
Chloride: 109 mmol/L (ref 98–111)
Creatinine, Ser: 0.76 mg/dL (ref 0.44–1.00)
GFR, Estimated: >60 mL/min (ref >60)
Glucose, Bld: 108 mg/dL — ABNORMAL HIGH (ref 70–99)
Potassium: 3.6 mmol/L (ref 3.5–5.1)
Sodium: 138 mmol/L (ref 135–145)

## 2021-02-23 MED ORDER — BISACODYL 10 MG RE SUPP
10.0000 mg | Freq: Every day | RECTAL | Status: DC
  Filled 2021-02-23 (×3): qty 1

## 2021-02-23 MED ORDER — METHOCARBAMOL 750 MG PO TABS
750.0000 mg | ORAL_TABLET | Freq: Three times a day (TID) | ORAL | Status: DC | PRN
  Administered 2021-02-23 – 2021-02-26 (×7): 750 mg via ORAL
  Filled 2021-02-23 (×7): qty 1

## 2021-02-23 MED ORDER — ACETAMINOPHEN 500 MG PO TABS
1000.0000 mg | ORAL_TABLET | Freq: Four times a day (QID) | ORAL | Status: DC
  Administered 2021-02-23 – 2021-02-26 (×12): 1000 mg via ORAL
  Filled 2021-02-23 (×13): qty 2

## 2021-02-23 MED ORDER — ENSURE ENLIVE PO LIQD
237.0000 mL | Freq: Two times a day (BID) | ORAL | Status: DC
  Administered 2021-02-24 – 2021-02-26 (×4): 237 mL via ORAL

## 2021-02-23 MED ORDER — HEPARIN SODIUM (PORCINE) 5000 UNIT/ML IJ SOLN
5000.0000 [IU] | Freq: Three times a day (TID) | INTRAMUSCULAR | Status: DC
  Administered 2021-02-23 – 2021-02-26 (×9): 5000 [IU] via SUBCUTANEOUS
  Filled 2021-02-23 (×9): qty 1

## 2021-02-23 NOTE — Progress Notes (Signed)
Subjective: Patient reports no BM, +flatus, back is more sore than pain, no leg pain or NTW  Objective: Vital signs in last 24 hours: Temp:  [97.9 F (36.6 C)-98.8 F (37.1 C)] 97.9 F (36.6 C) (04/08 0452) Pulse Rate:  [84-98] 84 (04/08 0452) Resp:  [18-20] 18 (04/08 0452) BP: (128-159)/(57-75) 159/75 (04/08 0452) SpO2:  [96 %-98 %] 98 % (04/08 0452)  Intake/Output from previous day: 04/07 0701 - 04/08 0700 In: 1858.8 [P.O.:1060; I.V.:798.8] Out: 2780 [Urine:2650; Drains:130] Intake/Output this shift: No intake/output data recorded.  Neurologic: Grossly normal  Lab Results: Lab Results  Component Value Date   WBC 6.2 02/23/2021   HGB 9.6 (L) 02/23/2021   HCT 28.5 (L) 02/23/2021   MCV 94.4 02/23/2021   PLT 174 02/23/2021   Lab Results  Component Value Date   INR 0.9 02/21/2021   BMET Lab Results  Component Value Date   NA 138 02/23/2021   K 3.6 02/23/2021   CL 109 02/23/2021   CO2 23 02/23/2021   GLUCOSE 108 (H) 02/23/2021   BUN 11 02/23/2021   CREATININE 0.76 02/23/2021   CALCIUM 8.1 (L) 02/23/2021    Studies/Results: Chest 2 View  Result Date: 02/21/2021 CLINICAL DATA:  Preoperative evaluation for lumbar surgery. EXAM: CHEST - 2 VIEW COMPARISON:  12/10/2019 FINDINGS: Both lungs are clear. Heart and mediastinum are within normal limits. Atherosclerotic calcifications at the aortic arch. Trachea is midline. Negative for a pneumothorax. No large pleural effusions. IMPRESSION: 1. No acute cardiopulmonary disease. 2.  Aortic Atherosclerosis (ICD10-I70.0). Electronically Signed   By: Richarda Overlie M.D.   On: 02/21/2021 11:25   DG Thoracolumabar Spine  Result Date: 02/21/2021 CLINICAL DATA:  T11 through L4 fixation. EXAM: THORACOLUMBAR SPINE 1V COMPARISON:  None. FINDINGS: Intraoperative fluoroscopy is obtained for surgical control purposes. Fluoroscopy time is recorded at 1 minute 55 seconds. Dose recorded at 48.72 mGy. Three spot fluoroscopic images are obtained. Spot  fluoroscopic images obtained demonstrate posterior rod and screw fixation across 6 levels at the thoracolumbar junction, likely T11 through L4. Compression deformities are demonstrated at the presumed L1 and L2 vertebra. Surgical hardware appears intact. Retractors are present. IMPRESSION: Intraoperative fluoroscopy obtained for surgical control purposes. Electronically Signed   By: Burman Nieves M.D.   On: 02/21/2021 21:16   DG C-Arm 1-60 Min  Result Date: 02/21/2021 CLINICAL DATA:  T11 through L4 fixation. EXAM: THORACOLUMBAR SPINE 1V COMPARISON:  None. FINDINGS: Intraoperative fluoroscopy is obtained for surgical control purposes. Fluoroscopy time is recorded at 1 minute 55 seconds. Dose recorded at 48.72 mGy. Three spot fluoroscopic images are obtained. Spot fluoroscopic images obtained demonstrate posterior rod and screw fixation across 6 levels at the thoracolumbar junction, likely T11 through L4. Compression deformities are demonstrated at the presumed L1 and L2 vertebra. Surgical hardware appears intact. Retractors are present. IMPRESSION: Intraoperative fluoroscopy obtained for surgical control purposes. Electronically Signed   By: Burman Nieves M.D.   On: 02/21/2021 21:16    Assessment/Plan: Continue to try to mobilize as tolerated with PT/OT, pain seems some what better, try to help her have a bm, may start sq hep today.  Estimated body mass index is 24.21 kg/m as calculated from the following:   Height as of this encounter: 5\' 3"  (1.6 m).   Weight as of this encounter: 62 kg.    LOS: 7 days    02/23/2021, 7:53 AM
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
## 2021-02-23 NOTE — Plan of Care (Signed)
  Problem: Health Behavior/Discharge Planning: Goal: Ability to manage health-related needs will improve Outcome: Progressing   Problem: Clinical Measurements: Goal: Ability to maintain clinical measurements within normal limits will improve Outcome: Progressing   Problem: Activity: Goal: Risk for activity intolerance will decrease Outcome: Progressing   Problem: Pain Managment: Goal: General experience of comfort will improve Outcome: Progressing   Problem: Safety: Goal: Ability to remain free from injury will improve Outcome: Progressing   Problem: Skin Integrity: Goal: Risk for impaired skin integrity will decrease Outcome: Progressing   
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
## 2021-02-24 LAB — CBC
HCT: 27.8 % — ABNORMAL LOW (ref 36.0–46.0)
Hemoglobin: 9.2 g/dL — ABNORMAL LOW (ref 12.0–15.0)
MCH: 31.4 pg (ref 26.0–34.0)
MCHC: 33.1 g/dL (ref 30.0–36.0)
MCV: 94.9 fL (ref 80.0–100.0)
Platelets: 195 10*3/uL (ref 150–400)
RBC: 2.93 MIL/uL — ABNORMAL LOW (ref 3.87–5.11)
RDW: 15.1 % (ref 11.5–15.5)
WBC: 8.1 10*3/uL (ref 4.0–10.5)
nRBC: 0 % (ref 0.0–0.2)

## 2021-02-24 NOTE — Progress Notes (Signed)
Patient voided just as we were going to perform the straight cath on her, scanned her and she was 130 and then proceeded to finish voiding. Patient was cleaned up and placed purewick back in place.

## 2021-02-24 NOTE — Progress Notes (Signed)
Subjective: Patient reports yesterday was miserable.  Still quite a bit of pain today.  Objective: Vital signs in last 24 hours: Temp:  [97.8 F (36.6 C)-98.2 F (36.8 C)] 97.8 F (36.6 C) (04/09 0442) Pulse Rate:  [84-90] 85 (04/09 0442) Resp:  [18] 18 (04/09 0442) BP: (129-149)/(57-71) 149/71 (04/09 0442) SpO2:  [96 %-99 %] 96 % (04/09 0442) Weight:  [65.1 kg] 65.1 kg (04/09 0500)  Intake/Output from previous day: 04/08 0701 - 04/09 0700 In: 700 [P.O.:700] Out: 670 [Urine:600; Drains:70] Intake/Output this shift: No intake/output data recorded.  Physical Exam: Strength full bilateral PF/DF.  Laying in bed.    Lab Results: Recent Labs    02/23/21 0223 02/24/21 0046  WBC 6.2 8.1  HGB 9.6* 9.2*  HCT 28.5* 27.8*  PLT 174 195   BMET Recent Labs    02/22/21 0851 02/23/21 0223  NA 133* 138  K 3.8 3.6  CL 102 109  CO2 20* 23  GLUCOSE 100* 108*  BUN 10 11  CREATININE 0.79 0.76  CALCIUM 8.1* 8.1*    Studies/Results: No results found.  Assessment/Plan: Patient is still in quite a bit of pain.  Requiring catheterization for urination.  Drain 70 cc's this am, will leave in place until tomorrow.  To mobilize with PT.    LOS: 8 days    Dorian Heckle, MD 02/24/2021, 8:54 AM
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
## 2021-02-24 NOTE — Progress Notes (Addendum)
Inpatient Rehab Admissions Coordinator Note:   Per therapy recommendations, pt was screened for CIR candidacy by Megan Salon, MS CCC-SLP. At this time, Pt. Appears to have functional decline and is a good candidate for CIR.  She remains pain limited but is participating more with therapies  Will place order for rehab consult per protocol.  Please contact me with questions.   Megan Salon, MS, CCC-SLP Rehab Admissions Coordinator  [phone] (celll) [phone] (office)

## 2021-02-24 NOTE — Progress Notes (Signed)
Inpatient Rehab Admissions Coordinator:   Met with patient at bedside to discuss potential CIR admission. Pt. Stated interest, family ,e,ebers present report they can do 24/7 support at discharge. Pt. Continues to have a lot of pain, but has been tolerating through the day without IV pain meds.  Will pursue for potential admit pending medical readiness and bed availability.   Clemens Catholic, [place], [place] Admissions Coordinator  [phone] ([place]) [phone] (office)

## 2021-02-24 NOTE — Progress Notes (Addendum)
At 2030, pt's bladder scan showed 141 ml.  At 0300, pt is complaining of feeling bladder is full. Bladder scan showed 315.  In and out done per order. Urine output via catheterization is 350 ml.
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
## 2021-02-25 NOTE — Plan of Care (Signed)
  Problem: Health Behavior/Discharge Planning: Goal: Ability to manage health-related needs will improve Outcome: Progressing   Problem: Clinical Measurements: Goal: Ability to maintain clinical measurements within normal limits will improve Outcome: Progressing   Problem: Activity: Goal: Risk for activity intolerance will decrease Outcome: Progressing   Problem: Pain Managment: Goal: General experience of comfort will improve Outcome: Progressing   Problem: Safety: Goal: Ability to remain free from injury will improve Outcome: Progressing   Problem: Skin Integrity: Goal: Risk for impaired skin integrity will decrease Outcome: Progressing   

## 2021-02-25 NOTE — Progress Notes (Signed)
Inpatient Rehab Admissions Coordinator:   Pt. Continues to report high levels of pain. Discussed potential admission to CIR with Pt.'s son, Roe Coombs, who is in agreement to see how she tolerates PT session today and discuss potential CIR admission early this week.   Megan Salon, MS, CCC-SLP Rehab Admissions Coordinator  [phone] (celll) [phone] (office)
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
## 2021-02-25 NOTE — Progress Notes (Signed)
Subjective: Patient c/o severe pain with movement  Objective: Vital signs in last 24 hours: Temp:  [98 F (36.7 C)-98.9 F (37.2 C)] 98 F (36.7 C) (04/10 0529) Pulse Rate:  [95-103] 95 (04/10 0529) Resp:  [15-17] 16 (04/10 0529) BP: (113-161)/(52-73) 133/62 (04/10 0529) SpO2:  [97 %-99 %] 97 % (04/10 0529)  Intake/Output from previous day: 04/09 0701 - 04/10 0700 In: 0  Out: 1145 [Urine:1100; Drains:45] Intake/Output this shift: Total I/O In: -  Out: 700 [Urine:700]  Difficult to mobilize patient to see dressing due to pain, but dressing appears c/d Full strength in LEs  Lab Results: Recent Labs    02/23/21 0223 02/24/21 0046  WBC 6.2 8.1  HGB 9.6* 9.2*  HCT 28.5* 27.8*  PLT 174 195   BMET Recent Labs    02/23/21 0223  NA 138  K 3.6  CL 109  CO2 23  GLUCOSE 108*  BUN 11  CREATININE 0.76  CALCIUM 8.1*    Studies/Results: No results found.  Assessment/Plan: S/p thoracic fusion - likely drain removal tomorrow - possible rehab transfer this week   Shelby Arellano 02/25/2021, 11:42 AM

## 2021-02-26 ENCOUNTER — Encounter (HOSPITAL_COMMUNITY): Payer: Self-pay | Admitting: Physical Medicine and Rehabilitation

## 2021-02-26 ENCOUNTER — Other Ambulatory Visit: Payer: Self-pay

## 2021-02-26 ENCOUNTER — Inpatient Hospital Stay (HOSPITAL_COMMUNITY): Payer: Medicare Other

## 2021-02-26 ENCOUNTER — Inpatient Hospital Stay (HOSPITAL_COMMUNITY)
Admission: RE | Admit: 2021-02-26 | Discharge: 2021-03-21 | DRG: 560 | Disposition: A | Discharge status: Home / Self Care | Payer: Medicare Other | Source: Intra-hospital | Attending: Physical Medicine and Rehabilitation | Admitting: Physical Medicine and Rehabilitation

## 2021-02-26 DIAGNOSIS — K529 Noninfective gastroenteritis and colitis, unspecified: Secondary | ICD-10-CM | POA: Diagnosis present

## 2021-02-26 DIAGNOSIS — T403X5A Adverse effect of methadone, initial encounter: Secondary | ICD-10-CM | POA: Diagnosis not present

## 2021-02-26 DIAGNOSIS — Z981 Arthrodesis status: Secondary | ICD-10-CM

## 2021-02-26 DIAGNOSIS — R410 Disorientation, unspecified: Secondary | ICD-10-CM | POA: Diagnosis present

## 2021-02-26 DIAGNOSIS — R739 Hyperglycemia, unspecified: Secondary | ICD-10-CM | POA: Diagnosis present

## 2021-02-26 DIAGNOSIS — S32021D Stable burst fracture of second lumbar vertebra, subsequent encounter for fracture with routine healing: Principal | ICD-10-CM

## 2021-02-26 DIAGNOSIS — B9620 Unspecified Escherichia coli [E. coli] as the cause of diseases classified elsewhere: Secondary | ICD-10-CM | POA: Diagnosis not present

## 2021-02-26 DIAGNOSIS — R208 Other disturbances of skin sensation: Secondary | ICD-10-CM | POA: Diagnosis present

## 2021-02-26 DIAGNOSIS — Z8249 Family history of ischemic heart disease and other diseases of the circulatory system: Secondary | ICD-10-CM | POA: Diagnosis not present

## 2021-02-26 DIAGNOSIS — R52 Pain, unspecified: Secondary | ICD-10-CM | POA: Diagnosis not present

## 2021-02-26 DIAGNOSIS — L299 Pruritus, unspecified: Secondary | ICD-10-CM | POA: Diagnosis not present

## 2021-02-26 DIAGNOSIS — E7800 Pure hypercholesterolemia, unspecified: Secondary | ICD-10-CM | POA: Diagnosis present

## 2021-02-26 DIAGNOSIS — R197 Diarrhea, unspecified: Secondary | ICD-10-CM

## 2021-02-26 DIAGNOSIS — G8911 Acute pain due to trauma: Secondary | ICD-10-CM | POA: Diagnosis present

## 2021-02-26 DIAGNOSIS — K5900 Constipation, unspecified: Secondary | ICD-10-CM

## 2021-02-26 DIAGNOSIS — E785 Hyperlipidemia, unspecified: Secondary | ICD-10-CM | POA: Diagnosis present

## 2021-02-26 DIAGNOSIS — G4720 Circadian rhythm sleep disorder, unspecified type: Secondary | ICD-10-CM | POA: Diagnosis present

## 2021-02-26 DIAGNOSIS — N319 Neuromuscular dysfunction of bladder, unspecified: Secondary | ICD-10-CM | POA: Diagnosis present

## 2021-02-26 DIAGNOSIS — Z885 Allergy status to narcotic agent status: Secondary | ICD-10-CM

## 2021-02-26 DIAGNOSIS — Z79899 Other long term (current) drug therapy: Secondary | ICD-10-CM

## 2021-02-26 DIAGNOSIS — N390 Urinary tract infection, site not specified: Secondary | ICD-10-CM | POA: Diagnosis not present

## 2021-02-26 DIAGNOSIS — T361X5A Adverse effect of cephalosporins and other beta-lactam antibiotics, initial encounter: Secondary | ICD-10-CM | POA: Diagnosis not present

## 2021-02-26 DIAGNOSIS — D62 Acute posthemorrhagic anemia: Secondary | ICD-10-CM

## 2021-02-26 DIAGNOSIS — M25531 Pain in right wrist: Secondary | ICD-10-CM | POA: Diagnosis not present

## 2021-02-26 DIAGNOSIS — Z888 Allergy status to other drugs, medicaments and biological substances status: Secondary | ICD-10-CM

## 2021-02-26 DIAGNOSIS — I10 Essential (primary) hypertension: Secondary | ICD-10-CM | POA: Diagnosis present

## 2021-02-26 DIAGNOSIS — S32001S Stable burst fracture of unspecified lumbar vertebra, sequela: Secondary | ICD-10-CM

## 2021-02-26 DIAGNOSIS — S2220XD Unspecified fracture of sternum, subsequent encounter for fracture with routine healing: Secondary | ICD-10-CM

## 2021-02-26 DIAGNOSIS — Y92239 Unspecified place in hospital as the place of occurrence of the external cause: Secondary | ICD-10-CM | POA: Diagnosis not present

## 2021-02-26 DIAGNOSIS — F4323 Adjustment disorder with mixed anxiety and depressed mood: Secondary | ICD-10-CM

## 2021-02-26 DIAGNOSIS — K592 Neurogenic bowel, not elsewhere classified: Secondary | ICD-10-CM | POA: Diagnosis present

## 2021-02-26 DIAGNOSIS — E876 Hypokalemia: Secondary | ICD-10-CM | POA: Diagnosis present

## 2021-02-26 DIAGNOSIS — R159 Full incontinence of feces: Secondary | ICD-10-CM | POA: Diagnosis present

## 2021-02-26 DIAGNOSIS — G4700 Insomnia, unspecified: Secondary | ICD-10-CM | POA: Diagnosis present

## 2021-02-26 MED ORDER — ACETAMINOPHEN 325 MG PO TABS
650.0000 mg | ORAL_TABLET | Freq: Three times a day (TID) | ORAL | Status: DC
  Administered 2021-02-26 – 2021-03-21 (×68): 650 mg via ORAL
  Filled 2021-02-26 (×67): qty 2

## 2021-02-26 MED ORDER — PROCHLORPERAZINE MALEATE 5 MG PO TABS: 5.0000 mg | ORAL_TABLET | Freq: Four times a day (QID) | ORAL | Status: DC | PRN

## 2021-02-26 MED ORDER — HEPARIN SODIUM (PORCINE) 5000 UNIT/ML IJ SOLN
5000.0000 [IU] | Freq: Three times a day (TID) | INTRAMUSCULAR | Status: DC
  Administered 2021-02-26 – 2021-02-27 (×3): 5000 [IU] via SUBCUTANEOUS
  Filled 2021-02-26 (×3): qty 1

## 2021-02-26 MED ORDER — BISACODYL 10 MG RE SUPP
10.0000 mg | Freq: Every day | RECTAL | Status: DC
  Administered 2021-02-26 – 2021-03-06 (×5): 10 mg via RECTAL
  Filled 2021-02-26 (×7): qty 1

## 2021-02-26 MED ORDER — LIDOCAINE 5 % EX PTCH
1.0000 | MEDICATED_PATCH | CUTANEOUS | Status: DC
  Administered 2021-02-26: 1 via TRANSDERMAL
  Filled 2021-02-26: qty 1

## 2021-02-26 MED ORDER — HYDROMORPHONE 1 MG/ML IV SOLN
INTRAVENOUS | Status: DC
Start: 2021-02-26 — End: 2021-02-26
  Filled 2021-02-26: qty 30

## 2021-02-26 MED ORDER — SODIUM CHLORIDE 0.9% FLUSH: 3.0000 mL | INTRAVENOUS | Status: DC | PRN

## 2021-02-26 MED ORDER — LIDOCAINE HCL URETHRAL/MUCOSAL 2 % EX GEL
CUTANEOUS | Status: DC | PRN
  Filled 2021-02-26: qty 11

## 2021-02-26 MED ORDER — FLEET ENEMA 7-19 GM/118ML RE ENEM: 1.0000 | ENEMA | Freq: Once | RECTAL | Status: DC | PRN

## 2021-02-26 MED ORDER — TRAZODONE HCL 50 MG PO TABS
25.0000 mg | ORAL_TABLET | Freq: Every evening | ORAL | Status: DC | PRN
  Administered 2021-02-28 – 2021-03-05 (×3): 50 mg via ORAL
  Filled 2021-02-26 (×3): qty 1

## 2021-02-26 MED ORDER — MENTHOL 3 MG MT LOZG: 1.0000 | LOZENGE | OROMUCOSAL | Status: DC | PRN

## 2021-02-26 MED ORDER — ONDANSETRON HCL 4 MG/2ML IJ SOLN: 4.0000 mg | Freq: Four times a day (QID) | INTRAMUSCULAR | Status: DC | PRN

## 2021-02-26 MED ORDER — PROCHLORPERAZINE 25 MG RE SUPP
12.5000 mg | Freq: Four times a day (QID) | RECTAL | Status: DC | PRN
Start: 2021-02-26 — End: 2021-03-21

## 2021-02-26 MED ORDER — SODIUM CHLORIDE 0.9% FLUSH
3.0000 mL | Freq: Two times a day (BID) | INTRAVENOUS | Status: DC
  Administered 2021-02-26: 3 mL via INTRAVENOUS

## 2021-02-26 MED ORDER — GUAIFENESIN-DM 100-10 MG/5ML PO SYRP: 5.0000 mL | ORAL_SOLUTION | Freq: Four times a day (QID) | ORAL | Status: DC | PRN

## 2021-02-26 MED ORDER — PHENOL 1.4 % MT LIQD: 1.0000 | OROMUCOSAL | Status: DC | PRN

## 2021-02-26 MED ORDER — SODIUM CHLORIDE 0.9% FLUSH: 9.0000 mL | INTRAVENOUS | Status: DC | PRN

## 2021-02-26 MED ORDER — ONDANSETRON HCL 4 MG PO TABS
4.0000 mg | ORAL_TABLET | Freq: Four times a day (QID) | ORAL | Status: DC | PRN
Start: 2021-02-26 — End: 2021-03-21
  Filled 2021-02-26: qty 1

## 2021-02-26 MED ORDER — PROCHLORPERAZINE EDISYLATE 10 MG/2ML IJ SOLN: 5.0000 mg | Freq: Four times a day (QID) | INTRAMUSCULAR | Status: DC | PRN

## 2021-02-26 MED ORDER — METHOCARBAMOL 500 MG PO TABS: 1000.0000 mg | ORAL_TABLET | Freq: Three times a day (TID) | ORAL | Status: DC | PRN

## 2021-02-26 MED ORDER — OXYCODONE HCL 5 MG PO TABS
5.0000 mg | ORAL_TABLET | ORAL | Status: DC | PRN
  Administered 2021-02-26 – 2021-03-02 (×6): 10 mg via ORAL
  Administered 2021-03-02: 5 mg via ORAL
  Administered 2021-03-02: 10 mg via ORAL
  Filled 2021-02-26 (×9): qty 2

## 2021-02-26 MED ORDER — LIDOCAINE 5 % EX PTCH
1.0000 | MEDICATED_PATCH | CUTANEOUS | Status: DC
  Filled 2021-02-26: qty 1

## 2021-02-26 MED ORDER — DIPHENHYDRAMINE HCL 12.5 MG/5ML PO ELIX: 12.5000 mg | ORAL_SOLUTION | Freq: Four times a day (QID) | ORAL | Status: DC | PRN

## 2021-02-26 MED ORDER — ALUM & MAG HYDROXIDE-SIMETH 200-200-20 MG/5ML PO SUSP: 30.0000 mL | ORAL | Status: DC | PRN

## 2021-02-26 MED ORDER — SENNA 8.6 MG PO TABS
2.0000 | ORAL_TABLET | Freq: Every day | ORAL | Status: DC
  Administered 2021-02-27 – 2021-02-28 (×2): 17.2 mg via ORAL
  Filled 2021-02-26 (×2): qty 2

## 2021-02-26 MED ORDER — DIPHENHYDRAMINE HCL 25 MG PO CAPS: 25.0000 mg | ORAL_CAPSULE | Freq: Every evening | ORAL | Status: DC | PRN

## 2021-02-26 MED ORDER — TAMSULOSIN HCL 0.4 MG PO CAPS: 0.4000 mg | ORAL_CAPSULE | Freq: Every day | ORAL | Status: DC

## 2021-02-26 MED ORDER — BISACODYL 10 MG RE SUPP
10.0000 mg | Freq: Every day | RECTAL | Status: DC | PRN
Start: 2021-02-26 — End: 2021-02-26

## 2021-02-26 MED ORDER — DIPHENHYDRAMINE HCL 50 MG/ML IJ SOLN: 12.5000 mg | Freq: Four times a day (QID) | INTRAMUSCULAR | Status: DC | PRN

## 2021-02-26 MED ORDER — CALCIUM CITRATE 950 (200 CA) MG PO TABS
200.0000 mg | ORAL_TABLET | Freq: Two times a day (BID) | ORAL | Status: DC
  Administered 2021-02-26 – 2021-03-06 (×18): 200 mg via ORAL
  Filled 2021-02-26 (×18): qty 1

## 2021-02-26 MED ORDER — POLYETHYLENE GLYCOL 3350 17 G PO PACK: 17.0000 g | PACK | Freq: Every day | ORAL | Status: DC | PRN

## 2021-02-26 MED ORDER — ACETAMINOPHEN 325 MG PO TABS: 325.0000 mg | ORAL_TABLET | ORAL | Status: DC | PRN

## 2021-02-26 MED ORDER — NALOXONE HCL 0.4 MG/ML IJ SOLN: 0.4000 mg | INTRAMUSCULAR | Status: DC | PRN

## 2021-02-26 MED ORDER — SODIUM CHLORIDE 0.9 % IV SOLN: 250.0000 mL | INTRAVENOUS | Status: DC | PRN
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
## 2021-02-26 NOTE — Progress Notes (Signed)
Inpatient Rehabilitation  Patient information reviewed and entered into eRehab system by Taite Schoeppner M. Aunesty Tyson, M.A., CCC/SLP, PPS Coordinator.  Information including medical coding, functional ability and quality indicators will be reviewed and updated through discharge.    

## 2021-02-26 NOTE — Progress Notes (Signed)
Inpatient Rehabilitation Medication Review by a Pharmacist  A complete drug regimen review was completed for this patient to identify any potential clinically significant medication issues.  Clinically significant medication issues were identified:  yes   Type of Medication Issue Identified Description of Issue Urgent (address now) Non-Urgent (address on AM team rounds) Plan Plan Accepted by Provider? (Yes / No / Pending AM Rounds)  Drug Interaction(s) (clinically significant)       Duplicate Therapy       Allergy       No Medication Administration End Date       Incorrect Dose       Additional Drug Therapy Needed  methocarbamol ordered inpatient but not continued at rehab, no rationale given Non-urgent Following pharmacist to follow up Pending AM rounds  Other         Name of provider notified for urgent issues identified:   Provider Method of Notification:    For non-urgent medication issues to be resolved on team rounds tomorrow morning a [facility] Secure Chat Handoff was sent to:    Pharmacist comments:   Time spent performing this drug regimen review (minutes):  15   Shelby Arellano 02/26/2021 7:48 PM

## 2021-02-26 NOTE — Progress Notes (Signed)
Inpatient Rehab Admissions Coordinator:   I have a CIR bed for this patient and plan to admit her today. RN may call report to [phone]. I have reached out top Dr. Yetta Barre to see if Pt.'s drain can be removed before coming to CIR and have not received a response. Per rehab MD, Pt. Is okay to come with drain if it is not removed.   Megan Salon, MS, CCC-SLP Rehab Admissions Coordinator  [phone] (celll) [phone] (office)
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
## 2021-02-26 NOTE — Progress Notes (Signed)
Patient transported to X-Ray at 9:50 pm
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
## 2021-02-27 ENCOUNTER — Inpatient Hospital Stay (HOSPITAL_COMMUNITY): Payer: Medicare Other

## 2021-02-27 DIAGNOSIS — G8911 Acute pain due to trauma: Secondary | ICD-10-CM

## 2021-02-27 LAB — CBC WITH DIFFERENTIAL/PLATELET
Abs Immature Granulocytes: 0.22 10*3/uL — ABNORMAL HIGH (ref 0.00–0.07)
Basophils Absolute: 0.1 10*3/uL (ref 0.0–0.1)
Basophils Relative: 1 %
Eosinophils Absolute: 0.4 10*3/uL (ref 0.0–0.5)
Eosinophils Relative: 4 %
HCT: 31 % — ABNORMAL LOW (ref 36.0–46.0)
Hemoglobin: 10.1 g/dL — ABNORMAL LOW (ref 12.0–15.0)
Immature Granulocytes: 2 %
Lymphocytes Relative: 20 %
Lymphs Abs: 2 10*3/uL (ref 0.7–4.0)
MCH: 31.1 pg (ref 26.0–34.0)
MCHC: 32.6 g/dL (ref 30.0–36.0)
MCV: 95.4 fL (ref 80.0–100.0)
Monocytes Absolute: 0.8 10*3/uL (ref 0.1–1.0)
Monocytes Relative: 8 %
Neutro Abs: 6.7 10*3/uL (ref 1.7–7.7)
Neutrophils Relative %: 65 %
Platelets: 160 10*3/uL (ref 150–400)
RBC: 3.25 MIL/uL — ABNORMAL LOW (ref 3.87–5.11)
RDW: 15.9 % — ABNORMAL HIGH (ref 11.5–15.5)
WBC: 10.1 10*3/uL (ref 4.0–10.5)
nRBC: 0 % (ref 0.0–0.2)

## 2021-02-27 LAB — COMPREHENSIVE METABOLIC PANEL
ALT: 55 U/L — ABNORMAL HIGH (ref 0–44)
AST: 49 U/L — ABNORMAL HIGH (ref 15–41)
Albumin: 2.3 g/dL — ABNORMAL LOW (ref 3.5–5.0)
Alkaline Phosphatase: 463 U/L — ABNORMAL HIGH (ref 38–126)
Anion gap: 13 (ref 5–15)
BUN: 9 mg/dL (ref 8–23)
CO2: 23 mmol/L (ref 22–32)
Calcium: 8.6 mg/dL — ABNORMAL LOW (ref 8.9–10.3)
Chloride: 101 mmol/L (ref 98–111)
Creatinine, Ser: 0.68 mg/dL (ref 0.44–1.00)
GFR, Estimated: >60 mL/min (ref >60)
Glucose, Bld: 96 mg/dL (ref 70–99)
Potassium: 3 mmol/L — ABNORMAL LOW (ref 3.5–5.1)
Sodium: 137 mmol/L (ref 135–145)
Total Bilirubin: 0.9 mg/dL (ref 0.3–1.2)
Total Protein: 5.4 g/dL — ABNORMAL LOW (ref 6.5–8.1)

## 2021-02-27 LAB — MAGNESIUM: Magnesium: 1.9 mg/dL (ref 1.7–2.4)

## 2021-02-27 MED ORDER — ENOXAPARIN SODIUM 40 MG/0.4ML ~~LOC~~ SOLN
40.0000 mg | SUBCUTANEOUS | Status: DC
  Administered 2021-02-27 – 2021-03-20 (×22): 40 mg via SUBCUTANEOUS
  Filled 2021-02-27 (×21): qty 0.4

## 2021-02-27 MED ORDER — TAMSULOSIN HCL 0.4 MG PO CAPS
0.8000 mg | ORAL_CAPSULE | Freq: Every day | ORAL | Status: DC
  Administered 2021-02-27 – 2021-03-20 (×22): 0.8 mg via ORAL
  Filled 2021-02-27 (×21): qty 2

## 2021-02-27 MED ORDER — LIDOCAINE 5 % EX PTCH
3.0000 | MEDICATED_PATCH | CUTANEOUS | Status: DC
  Administered 2021-02-28 – 2021-03-12 (×13): 3 via TRANSDERMAL
  Filled 2021-02-27 (×14): qty 3

## 2021-02-27 MED ORDER — POTASSIUM CHLORIDE CRYS ER 20 MEQ PO TBCR
20.0000 meq | EXTENDED_RELEASE_TABLET | Freq: Three times a day (TID) | ORAL | Status: DC
  Administered 2021-02-27 – 2021-03-02 (×10): 20 meq via ORAL
  Filled 2021-02-27 (×10): qty 1
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
## 2021-02-27 NOTE — Progress Notes (Signed)
Inpatient Rehabilitation Center Individual Statement of Services  Patient Name:  Shelby Arellano  Date:  02/27/2021  Welcome to the Inpatient Rehabilitation Center.  Our goal is to provide you with an individualized program based on your diagnosis and situation, designed to meet your specific needs.  With this comprehensive rehabilitation program, you will be expected to participate in at least 3 hours of rehabilitation therapies Monday-Friday, with modified therapy programming on the weekends.  Your rehabilitation program will include the following services:  Physical Therapy (PT), Occupational Therapy (OT), Speech Therapy (ST), 24 hour per day rehabilitation nursing, Therapeutic Recreaction (TR), Neuropsychology, Care Coordinator, Rehabilitation Medicine, Nutrition Services, Pharmacy Services and Other  Weekly team conferences will be held on [place] to discuss your progress.  Your Inpatient Rehabilitation Care Coordinator will talk with you frequently to get your input and to update you on team discussions.  Team conferences with you and your family in attendance may also be held.  Expected length of stay: 18-21 Days  Overall anticipated outcome: Min/Mod A  Depending on your progress and recovery, your program may change. Your Inpatient Rehabilitation Care Coordinator will coordinate services and will keep you informed of any changes. Your Inpatient Rehabilitation Care Coordinator's name and contact numbers are listed  below.  The following services may also be recommended but are not provided by the Inpatient Rehabilitation Center:    Home Health Rehabiltiation Services  Outpatient Rehabilitation Services    Arrangements will be made to provide these services after discharge if needed.  Arrangements include referral to agencies that provide these services.  Your insurance has been verified to be:  Medicare A & B Your primary doctor is:  Benedetto Goad, MD  Pertinent information will be  shared with your doctor and your insurance company.  Inpatient Rehabilitation Care Coordinator:  Lavera Guise, [place] [phone] or [phone]  Information discussed with and copy given to patient by: Andria Rhein, 02/27/2021, 1:21 PM
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
## 2021-02-27 NOTE — Progress Notes (Signed)
Kdur added for recurrent hypokalemia--will supplement for 48 hours and recheck for next 2 days. Will also check Mg level.
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
## 2021-02-28 LAB — BASIC METABOLIC PANEL
Anion gap: 10 (ref 5–15)
BUN: 13 mg/dL (ref 8–23)
CO2: 23 mmol/L (ref 22–32)
Calcium: 8.8 mg/dL — ABNORMAL LOW (ref 8.9–10.3)
Chloride: 102 mmol/L (ref 98–111)
Creatinine, Ser: 0.79 mg/dL (ref 0.44–1.00)
GFR, Estimated: >60 mL/min (ref >60)
Glucose, Bld: 89 mg/dL (ref 70–99)
Potassium: 3.8 mmol/L (ref 3.5–5.1)
Sodium: 135 mmol/L (ref 135–145)

## 2021-02-28 MED ORDER — OXYCODONE HCL 5 MG PO TABS
10.0000 mg | ORAL_TABLET | Freq: Every day | ORAL | Status: DC
  Administered 2021-03-01 – 2021-03-03 (×3): 10 mg via ORAL
  Filled 2021-02-28 (×3): qty 2

## 2021-02-28 MED ORDER — DIPHENOXYLATE-ATROPINE 2.5-0.025 MG PO TABS
1.0000 | ORAL_TABLET | Freq: Two times a day (BID) | ORAL | Status: DC | PRN
  Administered 2021-03-02 – 2021-03-21 (×5): 1 via ORAL
  Filled 2021-02-28 (×5): qty 1

## 2021-02-28 MED ORDER — METHADONE HCL 5 MG PO TABS
2.5000 mg | ORAL_TABLET | Freq: Two times a day (BID) | ORAL | Status: DC
  Administered 2021-02-28 – 2021-03-01 (×2): 2.5 mg via ORAL
  Filled 2021-02-28 (×2): qty 1

## 2021-02-28 NOTE — Progress Notes (Signed)
Occupational Therapy Session Note  Patient Details  Name: KIYLA RINGLER MRN: [id number] Date of Birth: 10/11/1935  Today's Date: 02/28/2021 OT Individual Time: 5681-2751 OT Individual Time Calculation (min): 61 min    Short Term Goals: Week 1:  OT Short Term Goal 1 (Week 1): Pt will maintain sitting EOB for 5 mins with supervision during self-care tasks OT Short Term Goal 2 (Week 1): Pt will complete bathing wtih mod assist of one caregiver OT Short Term Goal 3 (Week 1): Pt will complete stand pivot transfer with mod assist of one caregiver  Skilled Therapeutic Interventions/Progress Updates:    Pt greeted at time of session in bed with NT assisting with brief change, OT to assist. Rolling L/R with extended time max A for brief change. Severely limited by pain with mobility and remained consistent throughout session. Positioning provided and log rolling, continued to have pain with any mobility. After brief change, supine > sit Max A with 2nd helper present to prevent discomfort at knees. Once EOB, CGA for sitting balance. Donned jacket with Max A, hemi technique for RUE first as this UE does not move as well/more pain. Donned brace total A. Sit > stand Max A at [place] and stedy transfer > wheelchair. Max/total A for positioning and reciprocal scooting, positioning hips in wheelchair. BP checked at [facility] as pt had c/o lightheadedness and provided water as well, improved. Extensive time spent positioning head and hips in wheelchair. Son and spouse present throughout session, both receptive to education. Pt agreeable to sit up in chair for 30 minutes until PT session. Alarm on call bell in reach and family present.    Therapy Documentation Precautions:  Precautions Precautions: Back,Sternal,Fall Precaution Comments: brace when OOB Required Braces or Orthoses: Spinal Brace Spinal Brace: Thoracolumbosacral orthotic,Applied in sitting position Restrictions Weight Bearing Restrictions:  No     Therapy/Group: Individual Therapy  Erasmo Score 02/28/2021, 7:24 AM

## 2021-02-28 NOTE — Progress Notes (Signed)
Physical Therapy Session Note  Patient Details  Name: Shelby Arellano MRN: [id number] Date of Birth: 05/01/1935  Today's Date: 02/28/2021 PT Individual Time: 1103-1200 PT Individual Time Calculation (min): 57 min   Short Term Goals: Week 1:  PT Short Term Goal 1 (Week 1): pt to demonstrate supine<>sit mod A of one PT Short Term Goal 2 (Week 1): pt to demonstrate functional transfers with LRAD mod A x1 PT Short Term Goal 3 (Week 1): to initate gait with LRAD at least 25' mod A PT Short Term Goal 4 (Week 1): pt to tolerate sitting OOB 1 hour  Skilled Therapeutic Interventions/Progress Updates:   Pt received sitting in WC and agreeable to PT. But reports extreme pain 10/10. Pt transported to rehabg day room in WC. Sitting balance with max assist to achieve neutral position and prevent heavy posterior bias x 15 minutes with assist from PT to initiate and sustain activation of trunk and cervical flexion. Pt performed UE horizontal Add/abdcuction of BUE on table sitting in WC with AAROM due to pain in BUE/shoulders 3 x 10 with prolonged rest break between bouts. Pt requesting to return to bed. Pt returned to room and performed squat pivot transfer to bed with max assist and LLE blocked and BUE supported on PT. TLSO removed in sitting with total A. Sit>supine completed with total A and left supine in bed with call bell in reach and all needs met.        Therapy Documentation Precautions:  Precautions Precautions: Back,Sternal,Fall Precaution Comments: brace when OOB Required Braces or Orthoses: Spinal Brace Spinal Brace: Thoracolumbosacral orthotic,Applied in sitting position Restrictions Weight Bearing Restrictions: No    Vital Signs: Therapy Vitals Temp: 98 F (36.7 C) Pulse Rate: 91 Resp: 16 BP: (!) 96/54 Patient Position (if appropriate): Lying Oxygen Therapy SpO2: 99 % Pain:   10/10 back/neck/shoulder. Pt repositioned    Therapy/Group: Individual Therapy  Austin E  Tucker 02/28/2021, 5:04 PM  
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
## 2021-02-28 NOTE — Progress Notes (Signed)
Back incision with irritation and couple of blisters at the edge under honeycomb dressing. Edge sealed and kept in place to prevent soilage. Long discussion with husband on SCI/B/B program as well as pain management.
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
## 2021-03-01 LAB — BASIC METABOLIC PANEL
Anion gap: 5 (ref 5–15)
BUN: 9 mg/dL (ref 8–23)
CO2: 27 mmol/L (ref 22–32)
Calcium: 8.6 mg/dL — ABNORMAL LOW (ref 8.9–10.3)
Chloride: 105 mmol/L (ref 98–111)
Creatinine, Ser: 0.75 mg/dL (ref 0.44–1.00)
GFR, Estimated: >60 mL/min (ref >60)
Glucose, Bld: 96 mg/dL (ref 70–99)
Potassium: 4.3 mmol/L (ref 3.5–5.1)
Sodium: 137 mmol/L (ref 135–145)

## 2021-03-01 NOTE — Progress Notes (Signed)
Physical Therapy Session Note  Patient Details  Name: Shelby Arellano MRN: [id number] Date of Birth: [birth year]  Today's Date: 03/01/2021 PT Individual Time: 2376-2831 PT Individual Time Calculation (min): 60 min   Short Term Goals: Week 1:  PT Short Term Goal 1 (Week 1): pt to demonstrate supine<>sit mod A of one PT Short Term Goal 2 (Week 1): pt to demonstrate functional transfers with LRAD mod A x1 PT Short Term Goal 3 (Week 1): to initate gait with LRAD at least 25' mod A PT Short Term Goal 4 (Week 1): pt to tolerate sitting OOB 1 hour Week 2:     Skilled Therapeutic Interventions/Progress Updates:    pt received in [facility] and agreeable to therapy. Husband present at start and end of session. Pt taken to gym total A in [facility] for time and energy. Pt directed in x3 Sit to stand in // max A with +1 for safety to complete with manual facilitation of B knee blocking, hip extension, trunk extension and to maintain standing upright for <30s. pt required frequent VC for task attention, increased activation of mm groups for increased level of I for standing however greatly limited overall. Pt yelled out in pain with all mobility, VC for calming and deep breathing throughout improved slightly. Pt directed in AAROM 3x5 BLE hip flexion and knee extension for improved mobility and mm activation with all activity however pt yelled in pain with tactile cue to initiate hip flexion then given VC to attempt actively, no motion noted, PROM completed then pt able to complete <50% ROOM actively with AAROM to complete all activity. Pt frequent;ly verbalized, "I don't think I can", "I don't know what your asking", "I don't understand" and overall required greatly increased time to process all cues and command following, extra time for single step cues for all activity and attention to task. Pt taken back to room total A in WC, directed in Stand pivot transfer max A-total A to complete with B knee blocking and single step VC to  complete all movement to sit EOB. Pt then directed in sit>supine max A. Pt left in bed, All needs in reach and in good condition. Call light in hand.  And alarm set, .husband present.   Therapy Documentation Precautions:  Precautions Precautions: Back,Sternal,Fall Precaution Comments: brace when OOB Required Braces or Orthoses: Spinal Brace Spinal Brace: Thoracolumbosacral orthotic,Applied in sitting position Restrictions Weight Bearing Restrictions: No General:   Vital Signs: Therapy Vitals Temp: 97.9 F (36.6 C) Pulse Rate: 87 Resp: 16 BP: (!) 106/50 Patient Position (if appropriate): Lying Oxygen Therapy SpO2: 93 % O2 Device: Room Air Pain:   Mobility:   Locomotion :    Trunk/Postural Assessment :    Balance:   Exercises:   Other Treatments:      Therapy/Group: Individual Therapy  Barbaraann Faster 03/01/2021, 3:53 PM

## 2021-03-01 NOTE — Evaluation (Signed)
Speech Language Pathology Bedside Swallow Evaluation   Patient Details  Name: Shelby Arellano MRN: [id number] Date of Birth: June 22, 1935  SLP Diagnosis: N/A Rehab Potential: N/A ELOS: N/A   Today's Date: 03/01/2021 SLP Individual Time: 4174-0814 SLP Individual Time Calculation (min): 25 min   Hospital Problem: Principal Problem:   Lumbar burst fracture, sequela Active Problems:   Acute pain due to trauma  Past Medical History:  Past Medical History:  Diagnosis Date  . Arthritis   . Chronic diarrhea   . Clostridium difficile infection   . Complication of anesthesia    cannot have pentothol  . High cholesterol   . No pertinent past medical history   . Sciatica   . Seborrhea   . Vertigo    Past Surgical History:  Past Surgical History:  Procedure Laterality Date  . APPENDECTOMY  1962  . CARPAL TUNNEL RELEASE  10/12   rt   . CARPAL TUNNEL RELEASE  02/25/2012   Procedure: CARPAL TUNNEL RELEASE;  Surgeon: Wynonia Sours, MD;  Location: [facility];  Service: Orthopedics;  Laterality: Left;  . CATARACT EXTRACTION    . COLONOSCOPY    . EAR BIOPSY     growth lt ear  . HEMORROIDECTOMY  1999  . LUMBAR DISC SURGERY    . WISDOM TOOTH EXTRACTION      Assessment / Plan / Recommendation Clinical Impression Patient observed with breakfast meal of regular textures with thin liquids. Patient demonstrated intermittent difficulty with self-feeding, suspect due to poor positioning, lethargy and mild self-limiting behaviors. With repositioning and encouragement, patient self-fed 100% of her meal. Patient with efficient mastication and intermittent decreased attention to bolus but no overt s/s of aspiration noted with either solid textures or thin liquids. Recommend patient continue regular textures with thin liquids with set-up assist to ensure proper positioning with PO intake.  No skilled SLP f/u is needed at this time.    Skilled Therapeutic Interventions          Administered  a BSE, please see above for details.   SLP Assessment  Patient does not need any further Speech [facility]    Recommendations  Patient destination: Home Follow up Recommendations: None Equipment Recommended: None recommended by SLP    SLP Frequency N/A  SLP Duration  SLP Intensity  SLP Treatment/Interventions N/A  N/A  N/A   Pain Pain Assessment Pain Scale: 0-10 Pain Score: 6  Pain Location: Back Pain Orientation: Lower Pain Descriptors / Indicators: Aching   Care Tool Care Tool Cognition Expression of Ideas and Wants Expression of Ideas and Wants: Without difficulty (complex and basic) - expresses complex messages without difficulty and with speech that is clear and easy to understand   Understanding Verbal and Non-Verbal Content Understanding Verbal and Non-Verbal Content: Understands (complex and basic) - clear comprehension without cues or repetitions   Memory/Recall Ability *first 3 days only Memory/Recall Ability *first 3 days only: Location of own room;Staff names and faces;Current season;That he or she is in a hospital/hospital unit      Bedside Swallowing Assessment General Date of Onset: 03/01/21 Previous Swallow Assessment: N/A Diet Prior to this Study: Regular;Thin liquids Temperature Spikes Noted: No Respiratory Status: Room air History of Recent Intubation: No Behavior/Cognition: Alert;Cooperative Oral Cavity - Dentition: Adequate natural dentition Self-Feeding Abilities: Able to feed self;Needs assist Patient Positioning: Upright in bed Baseline Vocal Quality: Normal Volitional Cough: Strong Volitional Swallow: Able to elicit  Ice Chips Ice chips: Not tested Thin Liquid Thin  Liquid: Within functional limits Presentation: Straw Nectar Thick Nectar Thick Liquid: Not tested Honey Thick Honey Thick Liquid: Not tested Puree Puree: Within functional limits Presentation: Self Fed;Spoon Solid Solid: Impaired Oral Phase  Impairments: Poor awareness of bolus BSE Assessment Risk for Aspiration Other Related Risk Factors: Deconditioning;Lethargy  Short Term Goals: N/A   Refer to Care Plan for Long Term Goals  Recommendations for other services: None   Discharge Criteria: Patient will be discharged from SLP if patient refuses treatment 3 consecutive times without medical reason, if treatment goals not met, if there is a change in medical status, if patient makes no progress towards goals or if patient is discharged from hospital.  The above assessment, treatment plan, treatment alternatives and goals were discussed and mutually agreed upon: by patient  Bernadene Garside 03/01/2021, 12:17 PM

## 2021-03-01 NOTE — Progress Notes (Signed)
Physical Therapy Session Note  Patient Details  Name: PENINA REISNER MRN: [id number] Date of Birth: [birth year]  Today's Date: 03/01/2021 PT Individual Time: 4492-0100 PT Individual Time Calculation (min): 55 min   Short Term Goals: Week 1:  PT Short Term Goal 1 (Week 1): pt to demonstrate supine<>sit mod A of one PT Short Term Goal 2 (Week 1): pt to demonstrate functional transfers with LRAD mod A x1 PT Short Term Goal 3 (Week 1): to initate gait with LRAD at least 25' mod A PT Short Term Goal 4 (Week 1): pt to tolerate sitting OOB 1 hour  Skilled Therapeutic Interventions/Progress Updates:    Pt greeted supine in bed with spouse and son at bedside, RN also present administering medications. Pt agreeable to therapy session. She reports "significant" and extreme 10/10 pain. Unable to localize and describes as "everywhere" when asked. Pt appears very groggy and incoherent at times with difficulty completing sentences. Per RN, pt had received Trazodone last night and this may be impacting her level of alertness. Donned pants with totalA while supine in bed - encouraged her to assist in pulling up past knees but pt with limited effort. Eyes closed for majority of session and needing max cues for participation and eyes open. Rolling in bed required totalA with use of bed linen to assist. She performed bed level exercises including heel slides and hip abduction, needing AAROM (mostly PROM) for completing. She required totalA for supine<>sitting EOB via log rolling technique. She was able to sit EOB with minA while unsupported. Donned TLSO while seated EOB with totalA. Sit<stand with maxA from raised EOB height and stand<>pivot transfer with maxA to [name] w/c. Cues throughout for sequencing, safety, participation, and technique. Pt remained reclined in [name] w/c at end of session with needs in reach and husband at bedside.   Therapy Documentation Precautions:  Precautions Precautions:  Back,Sternal,Fall Precaution Comments: brace when OOB Required Braces or Orthoses: Spinal Brace Spinal Brace: Thoracolumbosacral orthotic,Applied in sitting position Restrictions Weight Bearing Restrictions: No General:    Therapy/Group: Individual Therapy  Sharren Schnurr P Jefferson Fullam PT 03/01/2021, 9:41 AM
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
## 2021-03-01 NOTE — Progress Notes (Signed)
Occupational Therapy Session Note  Patient Details  Name: Shelby Arellano MRN: [id number] Date of Birth: 1934-11-22  Today's Date: 03/01/2021 OT Individual Time: 5621-3086 and 5784-6962 OT Individual Time Calculation (min): 55 min and 26 min   Short Term Goals: Week 1:  OT Short Term Goal 1 (Week 1): Pt will maintain sitting EOB for 5 mins with supervision during self-care tasks OT Short Term Goal 2 (Week 1): Pt will complete bathing wtih mod assist of one caregiver OT Short Term Goal 3 (Week 1): Pt will complete stand pivot transfer with mod assist of one caregiver   Skilled Therapeutic Interventions/Progress Updates:    Pt greeted at time of session semireclined finishing lunch, agreeable to OT session. Husband present and remained throughout session. Needing to toilet and agreeable to try on commode with BSC over top. Supine > sit Max A, donned brace total A sitting EOB before sit > stand at [facility] A and transferred to toilet. Dependently doffed brief/pants and already had BM, able to have more BM on commode. Dependent for all hygiene, brief change, etc. Requiring max almost constant encouragement, multimodal cues, etc. For hip extension, standing, weight bearing, following commands, etc. Focused on simple phrases/commands with husband present to demonstrate pt performing better with simple commands compared to hyperverbal tendencies. Extended time spent on toilet d/t pain, frequent BM, constant hygiene, and decreased endurance. Max A to promote upright posture and preventing flexion as well. Stedy > bed and sit >supine Max A. Placed in side lying for more comofrtable position. Alarm on call bellin reach  Session 2: Pt greeted at time of session semireclined in bed resting with husband present, agreeable to OT session planning to wash UB and change gown. However, pt wanting to brush teeth first and d/t extended time for all tasks this is all that was performed during session. With paste in R  hand and brush in L hand, pt able to place toothepaste on brush, needing assist to problem solve switching brush to R hand, and brushed teeth Min A overall. Distal support at wrist needed at times, careful of bruising. Washed face with Min A again, distal support needed d/t weakness. Alarmon call bell in reach.   Therapy Documentation Precautions:  Precautions Precautions: Back,Sternal,Fall Precaution Comments: brace when OOB Required Braces or Orthoses: Spinal Brace Spinal Brace: Thoracolumbosacral orthotic,Applied in sitting position Restrictions Weight Bearing Restrictions: No     Therapy/Group: Individual Therapy  Erasmo Score 03/01/2021, 12:46 PM
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
## 2021-03-02 MED ORDER — GABAPENTIN 300 MG PO CAPS
300.0000 mg | ORAL_CAPSULE | Freq: Every day | ORAL | Status: DC
  Administered 2021-03-02: 300 mg via ORAL
  Filled 2021-03-02: qty 1

## 2021-03-02 MED ORDER — MELATONIN 3 MG PO TABS
3.0000 mg | ORAL_TABLET | Freq: Every day | ORAL | Status: DC
  Administered 2021-03-02 – 2021-03-20 (×19): 3 mg via ORAL
  Filled 2021-03-02 (×19): qty 1

## 2021-03-02 MED ORDER — POTASSIUM CHLORIDE CRYS ER 20 MEQ PO TBCR
20.0000 meq | EXTENDED_RELEASE_TABLET | Freq: Two times a day (BID) | ORAL | Status: DC
  Administered 2021-03-02 – 2021-03-07 (×10): 20 meq via ORAL
  Filled 2021-03-02 (×10): qty 1

## 2021-03-02 MED ORDER — SODIUM CHLORIDE 0.9 % IV SOLN: INTRAVENOUS | Status: DC

## 2021-03-02 MED ORDER — GABAPENTIN 100 MG PO CAPS
100.0000 mg | ORAL_CAPSULE | Freq: Every day | ORAL | Status: DC
  Administered 2021-03-02 – 2021-03-20 (×19): 100 mg via ORAL
  Filled 2021-03-02 (×17): qty 1

## 2021-03-02 MED ORDER — OXYCODONE HCL 5 MG PO TABS
5.0000 mg | ORAL_TABLET | ORAL | Status: DC | PRN
  Administered 2021-03-03 – 2021-03-04 (×2): 5 mg via ORAL
  Filled 2021-03-02 (×2): qty 1

## 2021-03-02 MED ORDER — MELATONIN 3 MG PO TABS: 3.0000 mg | ORAL_TABLET | Freq: Every evening | ORAL | Status: DC | PRN

## 2021-03-02 NOTE — Progress Notes (Signed)
Physical Therapy Session Note  Patient Details  Name: BRIGHID KOCH MRN: [id number] Date of Birth: [birth year]  Today's Date: 03/02/2021 PT Individual Time: 0935-1002 PT Individual Time Calculation (min): 27 min   Short Term Goals: Week 1:  PT Short Term Goal 1 (Week 1): pt to demonstrate supine<>sit mod A of one PT Short Term Goal 2 (Week 1): pt to demonstrate functional transfers with LRAD mod A x1 PT Short Term Goal 3 (Week 1): to initate gait with LRAD at least 25' mod A PT Short Term Goal 4 (Week 1): pt to tolerate sitting OOB 1 hour  Skilled Therapeutic Interventions/Progress Updates:    Patient in supine and reports trying to catch her breath.  She could not give one spot for the pain, but spouse in the room speaks for her stating he feels pain more from all the bruising all over and not so much from her back.  Patient supine for heel slides x 5 each leg with assistance, lots of cues and increased time.  Rolling to R with max A to initiate with flexion of knees and to reach to rail with L UE.  Patient side to sit max A and pt yelling in pain so cues throughout for breathing and relaxation. Pt. seated EOB with close S to min A due to R lateral lean at times to doff jacket, work on upright posture and don gown and TLSO.  Patient sit to stand and pivot transfer to [name] w/c with max A and pt moaning throughout.  Patient scooting in chair with max A.  Assisted to recline in w/c and left with spouse in the room.   Therapy Documentation Precautions:  Precautions Precautions: Back,Sternal,Fall Precaution Comments: brace when OOB Required Braces or Orthoses: Spinal Brace Spinal Brace: Thoracolumbosacral orthotic,Applied in sitting position Restrictions Weight Bearing Restrictions: No Pain: Pain Assessment Pain Score: 5  Pain Type: Acute pain Pain Location: Generalized Pain Descriptors / Indicators: Aching;Grimacing;Moaning Pain Onset: On-going Pain Intervention(s): Repositioned;Other  (Comment) (RN gave meds during session)   Therapy/Group: Individual Therapy  Elray Mcgregor  [place], [place] 03/02/2021, 12:22 PM

## 2021-03-02 NOTE — Progress Notes (Signed)
Followed up with patient's husband and son about fatigue/sedation this afternoon. Relayed that she was up last night and received oxycodone/trazodone about 1 am. They see improvement in pain control but are concerned about her sedation as well as her progress--patient has always been on the anxious side at baseline. Trazodone d/c--added Melatonin 3 mg at bedtime for sleep as well as low dose Gabapentin 100 mg after supper to help with pain/sleep. Continue 10 mg in am prior to starting therapy but decrease daytime dose to 5 mg prn tomorrow. Sleep chart ordered---monitor for sedative signs after good nights rest.

## 2021-03-02 NOTE — Progress Notes (Signed)
Physical Therapy Session Note  Patient Details  Name: GWENLYN HOTTINGER MRN: [id number] Date of Birth: 1935-10-08  Today's Date: 03/02/2021 PT Individual Time: 1100-1200 PT Individual Time Calculation (min): 60 min   Short Term Goals: Week 1:  PT Short Term Goal 1 (Week 1): pt to demonstrate supine<>sit mod A of one PT Short Term Goal 2 (Week 1): pt to demonstrate functional transfers with LRAD mod A x1 PT Short Term Goal 3 (Week 1): to initate gait with LRAD at least 25' mod A PT Short Term Goal 4 (Week 1): pt to tolerate sitting OOB 1 hour  Skilled Therapeutic Interventions/Progress Updates:    pt received in [facility] and agreeable to therapy. Husband left at start of session. Pt taken to gym in [facility] total A for time and energy. Pt directed in seated BLE strengthening exercises of 2x10 LAQ, hip flexion to pt's tolerance in ROM, hip abduction, hip adduction, pt required frequent VC for attention to task ,overall alertness, and to initiate, complete tasks and intermittent AAROM. Pt reported she enjoyed classical music so this was played during session to help calm pt overall and provide emotional support and pain control. Pt directed in anterior/posterior trunk leans without excessive movement, TLSO in place for improved weigh shifting for transfers, x10 completed. Pt consistently yelling out in pain with all attempts but then reported she was fearful of movement. Pt then returned to room total A in [facility] for energy and requested to return to bed. Pt directed in squat pivot transfer to bed, max A with extra time throughout for single step, slow cues to allow pt time to process and attempt to participate. Pt required this for every step of transfer. Once EOB, pt directed in sit>supine max A with several VC for technique and assist for trunk and BLE support. Pt left in supine, All needs in reach and in good condition. Call light in hand.  And alarm set.    Therapy Documentation Precautions:   Precautions Precautions: Back,Sternal,Fall Precaution Comments: brace when OOB Required Braces or Orthoses: Spinal Brace Spinal Brace: Thoracolumbosacral orthotic,Applied in sitting position Restrictions Weight Bearing Restrictions: No General:   Vital Signs:   Pain: Pain Assessment Pain Scale: 0-10 Pain Score: 5  Pain Type: Acute pain Pain Location: Generalized Pain Descriptors / Indicators: Aching;Grimacing;Moaning Pain Onset: On-going Pain Intervention(s): Repositioned;Other (Comment) (RN gave meds during session) Mobility:   Locomotion :    Trunk/Postural Assessment :    Balance:   Exercises:   Other Treatments:      Therapy/Group: Individual Therapy  Barbaraann Faster 03/02/2021, 12:44 PM

## 2021-03-02 NOTE — Progress Notes (Signed)
PROGRESS NOTE   Subjective/Complaints:   Spoke with husband- pt too sedated yesterday so stopped Methadone.  Pt reports pain is worse today, but explained had to stop Methadone due to sedation.  Pt reports "don't know what I'm trying to say", because kept repeating self  "last few days..."- but didn't complete sentence and after 4-5x, asked what she was trying to say- she didn't remember.   More awake this Am than yesterday,.   ROS: Limited due to cognition/sedation Objective:   No results found. No results for input(s): WBC, HGB, HCT, PLT in the last 72 hours. Recent Labs    02/28/21 0656 03/01/21 0509  NA 135 137  K 3.8 4.3  CL 102 105  CO2 23 27  GLUCOSE 89 96  BUN 13 9  CREATININE 0.79 0.75  CALCIUM 8.8* 8.6*    Intake/Output Summary (Last 24 hours) at 03/02/2021 0921 Last data filed at 03/01/2021 1851 Gross per 24 hour  Intake 320 ml  Output --  Net 320 ml        Physical Exam: Vital Signs Blood pressure 135/83, pulse 96, temperature 98.2 F (36.8 C), temperature source Oral, resp. rate 20, height 5\' 3"  (1.6 m), weight 65.1 kg, SpO2 100 %.  Physical Exam      General: awake, but sedated, vs confused- kept repeating self; alone;  NAD HENT: conjugate gaze; oropharynx very dry- asking for water- gave it to her- she couldn't/wouldn't hold cup CV: regular rate; no JVD Pulmonary: CTA B/L; no W/R/R- good air movement GI: soft, NT, ND, (+)BS- hypoactive Psychiatric: kept repeating herself and couldn't finish her thoughts Neurological: more awake than yesterday- wincing with every movement-   Skin: hemovac out; blisters on back incision incisi   Musculoskeletal: . Sensation intact.  Did allow me to test DF only- at least 4/5 B/L- but said hurt too bad to do MS exam. No change   Assessment/Plan: 1. Functional deficits which require 3+ hours per day of interdisciplinary therapy in a comprehensive  inpatient rehab setting.  Physiatrist is providing close team supervision and 24 hour management of active medical problems listed below.  Physiatrist and rehab team continue to assess barriers to discharge/monitor patient progress toward functional and medical goals  Care Tool:  Bathing    Body parts bathed by patient: Face   Body parts bathed by helper: Right arm,Left arm,Chest,Abdomen     Bathing assist Assist Level: 2 Helpers     Upper Body Dressing/Undressing Upper body dressing   What is the patient wearing?: Hospital gown only    Upper body assist Assist Level: Total Assistance - Patient < 25%    Lower Body Dressing/Undressing Lower body dressing      What is the patient wearing?: Incontinence brief     Lower body assist Assist for lower body dressing: Total Assistance - Patient < 25%     Toileting Toileting    Toileting assist Assist for toileting: Dependent - Patient 0%     Transfers Chair/bed transfer  Transfers assist  Chair/bed transfer activity did not occur: Safety/medical concerns  Chair/bed transfer assist level: Dependent - mechanical lift (Stedy)     Locomotion Ambulation  Ambulation assist   Ambulation activity did not occur: Safety/medical concerns          Walk 10 feet activity   Assist  Walk 10 feet activity did not occur: Safety/medical concerns        Walk 50 feet activity   Assist Walk 50 feet with 2 turns activity did not occur: Safety/medical concerns         Walk 150 feet activity   Assist Walk 150 feet activity did not occur: Safety/medical concerns         Walk 10 feet on uneven surface  activity   Assist Walk 10 feet on uneven surfaces activity did not occur: Safety/medical concerns         Wheelchair     Assist Will patient use wheelchair at discharge?: Yes Type of Wheelchair: Manual Wheelchair activity did not occur: Safety/medical concerns         Wheelchair 50 feet with 2  turns activity    Assist    Wheelchair 50 feet with 2 turns activity did not occur: Safety/medical concerns       Wheelchair 150 feet activity     Assist  Wheelchair 150 feet activity did not occur: Safety/medical concerns       Blood pressure 135/83, pulse 96, temperature 98.2 F (36.8 C), temperature source Oral, resp. rate 20, height 5\' 3"  (1.6 m), weight 65.1 kg, SpO2 100 %.  1.  Lumbar burst fracture s/p ORIF T11-L4- might be SCI with neurogenic bowel and bladder notable.              -patient may shower but incision must be covered  4/12- first day of evaluation- will order SLP since cognitive concerns as well as swallowing complaints by pt.  4/13-con't PT, OT and SLP- per her husband, today was the best day so far.  4/14- con't PT and OT- SLP doesn't feel like needs to see for dysphagia' no issues and will hear about cognition?    4/15- pt's husband refused SLP completely- con't PT and OT             -ELOS/Goals: minA 2-3 weeks 2.  Antithrombotics: -DVT/anticoagulation:  Pharmaceutical: Heparin  4/12- stop heparin and change to Lovenox 40 mg daily since renal function fine             -antiplatelet therapy: N/A 3. Pain Management: Oxycodone and robaxin prn.              --Lidocaine patches x3 added  for pain control             --Tylenol 650mg  TID scheduled.   4/15- stopped methadone per husband request that she was too sleepy- checked out to Dr R that could try Methadone 1mg  BID with a liquid form (can write RX and have pt husband bring in)- also on Oxycodone 10 mg in AM at 6am and prn oxy 5-10 mg during day 4. Mood: LCSW to follow for evaluation and support.              -antipsychotic agents: N/A 5. Neuropsych: This patient may be intermittently capable of making decisions on her own behalf. 6. Skin/Wound Care: Routine pressure relief measures.  7. Fluids/Electrolytes/Nutrition: Monitor I/O. Check lytes in am.  8. L2 burst fracture: Back precautions with brace  when at EOB/OOB 9. Sternal fracture: Encourage IS.  4/12- doing patches to help pain  4/13- adding methadone 2.5 mg BID for pain to help- explained it's hard to get control  of sternal pain.   4/14- will hold Methadone, since Oxycodone 10 mg in AM was also started.   Is too sedated already this AM.   4/15- still sleepy/hard to speak to me this AM- con't to monitor 10. Neurogenic bladder: Monitor voiding with PVR checks.              -continue FLomax->change to nights.  4/12- will increase Flomax to 0.8 mg and see if can void- per nursing, has been voiding much better today.    4/14- no issues- voiding much better-  11. Neurogenic bowel: Was incontinent during the night ( Movantik, miralax and senna taken this am).              --On colace, miralax, senna, movantik and daily suppository but laxative/suppositories have been refused/held multiple days.               --Will order KUB to monitor for stool burden as family reports poor intake.              --will start with Senna S two in am and suppository after supper for bowel program.  4/12- cannot assess if pt is SCI pt- but is at high risk- will determine when can check strength exam.   4/13- pt unable to push stool out- will continue suppository nightly- don't  Hold it, even if has BMs/incontinent. Add Lomotil if having too much diarrhea. Prn.  12. Acute blood loss anemia: H/H down to 9.2/94.9-->recheck in am.  13. Hyperglycemia: Likely due to stress--will order A1C for am.  14. Hypocalcemia: Ionized calcium 1.11.             --will add calcium supplement.  15. Hypertension: systolic BP mildly elevated, monitor TID.  4/14- BP 120s/70s- con't regimen 15. Baseline: patient used to walk 1-2 miles per day with her husband. Was very active around her home.   16. Hypokalemia  4/12- K+ 3.0- repleted and will recheck next 2 days.  4/13- K+ 3.8 today- will recheck Friday  17. Confusion  4/12- per pt, brain not working right- had pt seen by  Neuropsych and checking head CT.   4/13- CT was negative; but still c/o confusion- Neuropsych felt pt doing better- doesn't need MRI.   4/14- is more sedated, a lot more sedated this AM- holding methadone- con't Oxycodone scheduled in AM  4/15- husband feels like pt is doing better and doesn't want SLP or any other cognitive intervention.    I spent a total of 35 minutes on visit- seeing patient and talking to pt's husband and also nursing about pt this AM- >50% coordination of care   LOS: 4 days A FACE TO FACE EVALUATION WAS PERFORMED  Alann Avey 03/02/2021, 9:21 AM

## 2021-03-02 NOTE — Progress Notes (Signed)
Physical Therapy Session Note  Patient Details  Name: Shelby Arellano MRN: [id number] Date of Birth: February 26, 1935  Today's Date: 03/02/2021 PT Individual Time: 0086-7619 PT Individual Time Calculation (min): 28 min   Short Term Goals: Week 1:  PT Short Term Goal 1 (Week 1): pt to demonstrate supine<>sit mod A of one PT Short Term Goal 2 (Week 1): pt to demonstrate functional transfers with LRAD mod A x1 PT Short Term Goal 3 (Week 1): to initate gait with LRAD at least 25' mod A PT Short Term Goal 4 (Week 1): pt to tolerate sitting OOB 1 hour  Skilled Therapeutic Interventions/Progress Updates:  Pt supine in bed and asleep with no s/s of distress upon entrance to room. Husband and son present. Pt is extremely difficult and ultimately unable to rouse. Son and husband also attempting without success. Family stating concern over pt's current state. Fears calmed with education re: pt's current pain status and overall energy/ fatigue level. Also, pt's meds have recently changed with addition of sleep medication. Educated that pt has already had several sessions of therapy this day and received opioid pain medication about 2 hours prior to this session. Family with questions re: missing therapy with pt so fatigued. Informed husband and son, that if pt is this tired, she should be allowed to rest. Will continue to monitor medications and fatigue levels in order to get optimal time in therapy as well as optimal rest as pt requires both.   PA entered room at end of session and this PT related family's concerns with PA providing more detailed information re: medications and timing.   Therapy Documentation Precautions:  Precautions Precautions: Back,Sternal,Fall Precaution Comments: brace when OOB Required Braces or Orthoses: Spinal Brace Spinal Brace: Thoracolumbosacral orthotic,Applied in sitting position Restrictions Weight Bearing Restrictions: No  Therapy/Group: Individual Therapy  Loel Dubonnet  PT, DPT 03/02/2021, 5:25 PM

## 2021-03-03 ENCOUNTER — Inpatient Hospital Stay (HOSPITAL_COMMUNITY): Payer: Medicare Other

## 2021-03-03 MED ORDER — OXYCODONE HCL 5 MG PO TABS
5.0000 mg | ORAL_TABLET | Freq: Every day | ORAL | Status: DC
  Administered 2021-03-04: 5 mg via ORAL
  Filled 2021-03-03: qty 1

## 2021-03-03 NOTE — Progress Notes (Signed)
PROGRESS NOTE   Subjective/Complaints: Still sleepy with oxycodone- decrease to 5mg  for standing dose. Patient and husband agreeable. Husband says worst pain is in right wrist where there is bruising- obtain XR and apply ice.  Able to joke this morning, husband was pleased to see this  ROS: +pain in right wrist  Objective:   No results found. No results for input(s): WBC, HGB, HCT, PLT in the last 72 hours. Recent Labs    03/01/21 0509  NA 137  K 4.3  CL 105  CO2 27  GLUCOSE 96  BUN 9  CREATININE 0.75  CALCIUM 8.6*    Intake/Output Summary (Last 24 hours) at 03/03/2021 1320 Last data filed at 03/03/2021 0917 Gross per 24 hour  Intake 1001.19 ml  Output --  Net 1001.19 ml        Physical Exam: Vital Signs Blood pressure 129/61, pulse 87, temperature 98.8 F (37.1 C), temperature source Oral, resp. rate 18, height 5\' 3"  (1.6 m), weight 65.1 kg, SpO2 97 %. Gen: no distress, normal appearing HEENT: oral mucosa pink and moist, NCAT Cardio: Reg rate Chest: normal effort, normal rate of breathing Abd: soft, non-distended Ext: no edema Psychiatric: kept repeating herself and couldn't finish her thoughts Neurological: more awake than yesterday- wincing with every movement-   Skin: hemovac out; blisters on back incision, bruising over right wrist incisi   Musculoskeletal: . Sensation intact.  Did allow me to test DF only- at least 4/5 B/L- but said hurt too bad to do MS exam. No change   Assessment/Plan: 1. Functional deficits which require 3+ hours per day of interdisciplinary therapy in a comprehensive inpatient rehab setting.  Physiatrist is providing close team supervision and 24 hour management of active medical problems listed below.  Physiatrist and rehab team continue to assess barriers to discharge/monitor patient progress toward functional and medical goals  Care Tool:  Bathing    Body parts  bathed by patient: Face   Body parts bathed by helper: Right arm,Left arm,Chest,Abdomen     Bathing assist Assist Level: 2 Helpers     Upper Body Dressing/Undressing Upper body dressing   What is the patient wearing?: Hospital gown only    Upper body assist Assist Level: Total Assistance - Patient < 25%    Lower Body Dressing/Undressing Lower body dressing      What is the patient wearing?: Incontinence brief     Lower body assist Assist for lower body dressing: Total Assistance - Patient < 25%     Toileting Toileting    Toileting assist Assist for toileting: Dependent - Patient 0%     Transfers Chair/bed transfer  Transfers assist  Chair/bed transfer activity did not occur: Safety/medical concerns  Chair/bed transfer assist level: Maximal Assistance - Patient 25 - 49%     Locomotion Ambulation   Ambulation assist   Ambulation activity did not occur: Safety/medical concerns          Walk 10 feet activity   Assist  Walk 10 feet activity did not occur: Safety/medical concerns        Walk 50 feet activity   Assist Walk 50 feet with 2 turns activity did not  occur: Safety/medical concerns         Walk 150 feet activity   Assist Walk 150 feet activity did not occur: Safety/medical concerns         Walk 10 feet on uneven surface  activity   Assist Walk 10 feet on uneven surfaces activity did not occur: Safety/medical concerns         Wheelchair     Assist Will patient use wheelchair at discharge?: Yes Type of Wheelchair: Manual Wheelchair activity did not occur: Safety/medical concerns         Wheelchair 50 feet with 2 turns activity    Assist    Wheelchair 50 feet with 2 turns activity did not occur: Safety/medical concerns       Wheelchair 150 feet activity     Assist  Wheelchair 150 feet activity did not occur: Safety/medical concerns       Blood pressure 129/61, pulse 87, temperature 98.8 F (37.1  C), temperature source Oral, resp. rate 18, height 5\' 3"  (1.6 m), weight 65.1 kg, SpO2 97 %.  1.  Lumbar burst fracture s/p ORIF T11-L4- might be SCI with neurogenic bowel and bladder notable.              -ELOS/Goals: minA 2-3 weeks  -Continue CIR.  2.  Antithrombotics: -DVT/anticoagulation: Continue Lovenox 40 mg daily since renal function fine             -antiplatelet therapy: N/A 3. Pain Management: Oxycodone and robaxin prn.              --Lidocaine patches x3 added  for pain control             --Tylenol 650mg  TID scheduled.   Decrease oxycodone to 5mg  scheduled given affects on cognition and energy- patient and husband in agreement. Obtain XR right wrist where pain is worse. Apply ice.  4. Mood: LCSW to follow for evaluation and support.              -antipsychotic agents: N/A 5. Neuropsych: This patient may be intermittently capable of making decisions on her own behalf. 6. Skin/Wound Care: Routine pressure relief measures.  7. Fluids/Electrolytes/Nutrition: Monitor I/O. Monitor lytes weekly.  8. L2 burst fracture: Back precautions with brace when at EOB/OOB 9. Sternal fracture: Encourage IS.  Continue lidocaine patch.  10. Neurogenic bladder: Monitor voiding with PVR checks.              -continue FLomax->change to nights.  4/12- will increase Flomax to 0.8 mg and see if can void- per nursing, has been voiding much better today.    4/14- no issues- voiding much better-  11. Neurogenic bowel: Was incontinent during the night ( Movantik, miralax and senna taken this am).              --On colace, miralax, senna, movantik and daily suppository but laxative/suppositories have been refused/held multiple days.               --Will order KUB to monitor for stool burden as family reports poor intake.              --will start with Senna S two in am and suppository after supper for bowel program.  4/12- cannot assess if pt is SCI pt- but is at high risk- will determine when can check  strength exam.   4/13- pt unable to push stool out- will continue suppository nightly- don't  Hold it, even if has BMs/incontinent. Add Lomotil  if having too much diarrhea. Prn.  12. Acute blood loss anemia: H/H down to 9.2/94.9-->recheck in am.  13. Hyperglycemia: Likely due to stress--will order A1C for am.  14. Hypocalcemia: Ionized calcium 1.11.             --will add calcium supplement.  15. Hypertension: systolic BP mildly elevated, monitor TID.  4/14- BP 120s/70s- con't regimen 15. Baseline: patient used to walk 1-2 miles per day with her husband. Was very active around her home.   16. Hypokalemia  4/12- K+ 3.0- repleted and will recheck next 2 days.  4/13- K+ 3.8 today- will recheck Friday  17. Confusion  4/12- per pt, brain not working right- had pt seen by Neuropsych and checking head CT.   4/13- CT was negative; but still c/o confusion- Neuropsych felt pt doing better- doesn't need MRI.   4/14- is more sedated, a lot more sedated this AM- holding methadone- con't Oxycodone scheduled in AM  4/15- husband feels like pt is doing better and doesn't want SLP or any other cognitive intervention.     LOS: 5 days A FACE TO FACE EVALUATION WAS PERFORMED  Eldo Umanzor P Kaelene Elliston 03/03/2021, 1:20 PM

## 2021-03-03 NOTE — Progress Notes (Signed)
Occupational Therapy Session Note  Patient Details  Name: Shelby Arellano MRN: [id number] Date of Birth: Sep 20, 1935  Today's Date: 03/03/2021 OT Individual Time: 0701-0805 OT Individual Time Calculation (min): 64 min    Short Term Goals: Week 1:  OT Short Term Goal 1 (Week 1): Pt will maintain sitting EOB for 5 mins with supervision during self-care tasks OT Short Term Goal 2 (Week 1): Pt will complete bathing wtih mod assist of one caregiver OT Short Term Goal 3 (Week 1): Pt will complete stand pivot transfer with mod assist of one caregiver  Skilled Therapeutic Interventions/Progress Updates:    Pt received semi-reclined in bed, eating breakfast. Pt initially denies pain, but is very tearful, stating that she is never going to get better or see her family ever again. Extensive conversation around purpose of CIR stay, rehab progress/potentional, and therapeutic use of self. Discussed pt's family and interests with pt visibly Iess tearful and distressed at end of conversation. Incontinent void of bladder x2. Total A for pericare and changing brief at bed level, pt able to assist in rolling R/L, but req max A. Mod A to hold cup to be able to drink. C/o back pain with elevated HOB and in R wrist, did not quantify.  Pt not agreeable to get dressed or out of bed at this time.  Pt left semi-reclined in bed with husband present, bed alarm engaged, call bell in reach, and all immediate needs met.    Therapy Documentation Precautions:  Precautions Precautions: Back,Sternal,Fall Precaution Comments: brace when OOB Required Braces or Orthoses: Spinal Brace Spinal Brace: Thoracolumbosacral orthotic,Applied in sitting position Restrictions Weight Bearing Restrictions: No Pain:see session note ADL: See Care Tool for more details.  Therapy/Group: Individual Therapy  Volanda Napoleon MS, OTR/L  03/03/2021, 6:42 AM

## 2021-03-04 MED ORDER — OXYCODONE HCL 5 MG PO TABS
5.0000 mg | ORAL_TABLET | ORAL | Status: DC | PRN
  Administered 2021-03-05 – 2021-03-06 (×5): 5 mg via ORAL
  Filled 2021-03-04 (×5): qty 1

## 2021-03-04 NOTE — Progress Notes (Signed)
Occupational Therapy Session Note  Patient Details  Name: Shelby Arellano MRN: [id number] Date of Birth: 12/19/1934  Today's Date: 03/04/2021 OT Individual Time: 0930-1010 and 1443-1540 OT Individual Time Calculation (min): 40 min and 60 min  Short Term Goals: Week 1:  OT Short Term Goal 1 (Week 1): Pt will maintain sitting EOB for 5 mins with supervision during self-care tasks OT Short Term Goal 2 (Week 1): Pt will complete bathing wtih mod assist of one caregiver OT Short Term Goal 3 (Week 1): Pt will complete stand pivot transfer with mod assist of one caregiver  Skilled Therapeutic Interventions/Progress Updates:    Pt greeted in bed via PT handoff. She was agreeable to tx with pain manageable without additional interventions. Pt wanting to start by brushing her teeth. Pt with 0/3 recall of her back precautions. Per pt, "Nobody has told me anything." Max A of 1 for supine<sit via logroll technique. While EOB, her back brace was donned with Total A, pt able to maintain static balance with bilateral UE supported with close supervision. Mod balance assist while she engaged in 3 grooming tasks, including oral care while sitting up. Pt required visual, manual, and verbal cues for improving upright alignment due to Lt and posterior lean. Note that she used her Rt hand at dominant level during stated tasks with no c/o wrist pain. Pt reported just general malaise and whole body soreness when sitting up. Family present throughout session and very encouraging. Pt then returned to bed with 1 assist. Tried to have her push through feet to boost up in bed using trendelenburg feature but pt unable. +2 for boosting up. Left pt repositioned for comfort with all needs within reach and bed alarm set.   Note that pt is HOH and required simple cuing + cues repeated  2nd Session 1:1 tx (60 min) Pt greeted in bed, in care of nursing staff and just had her brief/gown changed. With encouragement from OT and family, pt  agreeable to transfer to the [name] to go outside. Max A of 1 for supine<sit via logroll, pt needing step by step cues for sequencing as well as calming cues due to increased feelings of anxiousness with mobility. TLSO donned while pt sat EOB. 2 assist for sit<stand in [place], pt needing therapist to stand in front of lift to facilitate trunk/hip extension while 2nd helper assisted a great deal with power up. 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 OT guided pt through diaphragmatic breathing exercises with hands on belly to facilitate diaphragm activation vs using accessory breathing muscles when anxious. Utilized these breathing strategies to promote calmness during transfer back to bed afterwards, once again using Stedy. Pt needing calming cues/breathing instruction for all mobility today. Increased time needed as well. At end of session pt remained in bed, anticipating nursing care due to soiled brief. NT made aware of pts position. Tx focus placed on activity tolerance and OOB tolerance.  Pt did not want pain medicine at end of session when asked, reported pain absolved at rest  Therapy Documentation Precautions:  Precautions Precautions: Back,Sternal,Fall Precaution Comments: brace when OOB Required Braces or Orthoses: Spinal Brace Spinal Brace: Thoracolumbosacral orthotic,Applied in sitting position Restrictions Weight Bearing Restrictions: No ADL: ADL Grooming: Minimal assistance Where Assessed-Grooming: Edge of bed Upper Body Bathing: Dependent Where Assessed-Upper Body Bathing: Edge of bed Lower Body Bathing: Dependent (+2) Where  Assessed-Lower Body Bathing: Edge of bed Upper Body Dressing: Dependent Where Assessed-Upper Body Dressing: Edge of bed      Therapy/Group: Individual Therapy  Nea Gittens A Anshika Pethtel 03/04/2021, 12:40 PM

## 2021-03-04 NOTE — Progress Notes (Signed)
Physical Therapy Session Note  Patient Details  Name: Shelby Arellano MRN: [id number] Date of Birth: June 09, 1935  Today's Date: 03/04/2021 PT Individual Time: 0900-0930 PT Individual Time Calculation (min): 30 min   Short Term Goals: Week 1:  PT Short Term Goal 1 (Week 1): pt to demonstrate supine<>sit mod A of one PT Short Term Goal 2 (Week 1): pt to demonstrate functional transfers with LRAD mod A x1 PT Short Term Goal 3 (Week 1): to initate gait with LRAD at least 25' mod A PT Short Term Goal 4 (Week 1): pt to tolerate sitting OOB 1 hour  Skilled Therapeutic Interventions/Progress Updates:    Pt received seated in bed with husband and son present, agreeable to PT session. No complaints of pain at rest, has onset of back pain with bed mobility and when attempting to stand. Utilized repositioning during session, pt declines any pain medication. Supine to sit with max A for BLE management and trunk elevation, increased time needed due to pain and fear of pain. Pt initially requires mod A for sitting balance EOB, progresses to CGA. Pt reports she has been incontinent of urine in her brief, agreeable with encouragement to attempting standing for brief change. Pt is dependent to don TLSO while seated EOB. Sit to stand with +2 to stedy, pt unable to achieve full upright stance due to pain and remains in flexed trunk/hips/knees position. Returned to supine with +2 for BLE management and trunk control. Pt is mod A for rolling L/R with cues for LE management and use of bedrails for dependent brief change. Pt is +2 to scoot up towards HOB. Pt left seated in bed with needs in reach, family present. Handed off to OT for next therapy session.  Therapy Documentation Precautions:  Precautions Precautions: Back,Sternal,Fall Precaution Comments: brace when OOB Required Braces or Orthoses: Spinal Brace Spinal Brace: Thoracolumbosacral orthotic,Applied in sitting position Restrictions Weight Bearing  Restrictions: No   Therapy/Group: Individual Therapy   Peter Congo, PT, DPT, CSRS  03/04/2021, 12:02 PM

## 2021-03-04 NOTE — Progress Notes (Signed)
Patient did not want to take dulcolax suppository this evening, preferring to attempt to take a fleets enema in the am if possible.  States she feels she is "going all the time" and wants a more controlled management of her bowels.

## 2021-03-04 NOTE — Progress Notes (Signed)
Occupational Therapy Session Note  Patient Details  Name: Shelby Arellano MRN: [id number] Date of Birth: 1935/02/17  Today's Date: 03/04/2021 OT Individual Time: 3295-1884 OT Individual Time Calculation (min): 55 min    Short Term Goals: Week 1:  OT Short Term Goal 1 (Week 1): Pt will maintain sitting EOB for 5 mins with supervision during self-care tasks OT Short Term Goal 2 (Week 1): Pt will complete bathing wtih mod assist of one caregiver OT Short Term Goal 3 (Week 1): Pt will complete stand pivot transfer with mod assist of one caregiver  Skilled Therapeutic Interventions/Progress Updates:    Pt resting in bed upon arrival eating breakfast. Pt requested more cranberry juice. Pt stated she slept better during the night and was more encouraged this morning. Pt declined OOB activities. Focus on bed mobility, discharge planning, relaxation strategies. Pt with slight word finding difficulties this morning and conversation somewhat tangential. Pt requires max A for bed mobility. Pt remained in bed with all needs within reach and bed alarm activated.  Therapy Documentation Precautions:  Precautions Precautions: Back,Sternal,Fall Precaution Comments: brace when OOB Required Braces or Orthoses: Spinal Brace Spinal Brace: Thoracolumbosacral orthotic,Applied in sitting position Restrictions Weight Bearing Restrictions: No Pain:  Pt states her pain is "better" this morning, about a 5/10 in back; emotional support   Therapy/Group: Individual Therapy  Rich Brave 03/04/2021, 7:59 AM

## 2021-03-04 NOTE — Progress Notes (Signed)
PROGRESS NOTE   Subjective/Complaints: Did much better with therapy today.  No pain at rest, but did have some while working with [name] today. Progressed to CG for sitting balance! Discussed continuing to wean oxycodone and she and family are agreeable.   ROS: +pain in right wrist, +spinal pain with mobility  Objective:   DG Wrist 2 Views Right  Result Date: 03/03/2021 CLINICAL DATA:  Wrist pain EXAM: RIGHT WRIST - 2 VIEW COMPARISON:  None. FINDINGS: No acute fracture or dislocation is noted. No soft tissue changes are seen. Degenerative changes are noted the first [facility] joint as well as in the second carpal row laterally. IMPRESSION: Degenerative changes laterally without acute abnormality. Electronically Signed   By: Alcide Clever M.D.   On: 03/03/2021 15:30   No results for input(s): WBC, HGB, HCT, PLT in the last 72 hours. No results for input(s): NA, K, CL, CO2, GLUCOSE, BUN, CREATININE, CALCIUM in the last 72 hours.  Intake/Output Summary (Last 24 hours) at 03/04/2021 1501 Last data filed at 03/04/2021 0800 Gross per 24 hour  Intake 300 ml  Output --  Net 300 ml        Physical Exam: Vital Signs Blood pressure (!) 129/37, pulse 97, temperature (!) 97.5 F (36.4 C), resp. rate 18, height 5\' 3"  (1.6 m), weight 65.1 kg, SpO2 100 %. Gen: no distress, normal appearing HEENT: oral mucosa pink and moist, NCAT Cardio: Reg rate Chest: normal effort, normal rate of breathing Abd: soft, non-distended Ext: no edema Psych: pleasant, normal affect  Skin: hemovac out; blisters on back incision, bruising over right wrist incisi   Musculoskeletal: . Sensation intact.  Did allow me to test DF only- at least 4/5 B/L- but said hurt too bad to do MS exam. No change   Assessment/Plan: 1. Functional deficits which require 3+ hours per day of interdisciplinary therapy in a comprehensive inpatient rehab setting.  Physiatrist is  providing close team supervision and 24 hour management of active medical problems listed below.  Physiatrist and rehab team continue to assess barriers to discharge/monitor patient progress toward functional and medical goals  Care Tool:  Bathing    Body parts bathed by patient: Face   Body parts bathed by helper: Right arm,Left arm,Chest,Abdomen     Bathing assist Assist Level: 2 Helpers     Upper Body Dressing/Undressing Upper body dressing   What is the patient wearing?: Hospital gown only    Upper body assist Assist Level: Total Assistance - Patient < 25%    Lower Body Dressing/Undressing Lower body dressing      What is the patient wearing?: Incontinence brief     Lower body assist Assist for lower body dressing: Total Assistance - Patient < 25%     Toileting Toileting    Toileting assist Assist for toileting: Dependent - Patient 0%     Transfers Chair/bed transfer  Transfers assist  Chair/bed transfer activity did not occur: Safety/medical concerns  Chair/bed transfer assist level: Maximal Assistance - Patient 25 - 49%     Locomotion Ambulation   Ambulation assist   Ambulation activity did not occur: Safety/medical concerns  Walk 10 feet activity   Assist  Walk 10 feet activity did not occur: Safety/medical concerns        Walk 50 feet activity   Assist Walk 50 feet with 2 turns activity did not occur: Safety/medical concerns         Walk 150 feet activity   Assist Walk 150 feet activity did not occur: Safety/medical concerns         Walk 10 feet on uneven surface  activity   Assist Walk 10 feet on uneven surfaces activity did not occur: Safety/medical concerns         Wheelchair     Assist Will patient use wheelchair at discharge?: Yes Type of Wheelchair: Manual Wheelchair activity did not occur: Safety/medical concerns         Wheelchair 50 feet with 2 turns activity    Assist     Wheelchair 50 feet with 2 turns activity did not occur: Safety/medical concerns       Wheelchair 150 feet activity     Assist  Wheelchair 150 feet activity did not occur: Safety/medical concerns       Blood pressure (!) 129/37, pulse 97, temperature (!) 97.5 F (36.4 C), resp. rate 18, height 5\' 3"  (1.6 m), weight 65.1 kg, SpO2 100 %.  1.  Lumbar burst fracture s/p ORIF T11-L4- might be SCI with neurogenic bowel and bladder notable.              -ELOS/Goals: minA 2-3 weeks  -Continue CIR. Tolerating therapy better 2.  Antithrombotics: -DVT/anticoagulation: Continue Lovenox 40 mg daily since renal function fine             -antiplatelet therapy: N/A 3. Pain Management: Oxycodone and robaxin prn.              --Lidocaine patches x3 added  for pain control             --Tylenol 650mg  TID scheduled.   D/c standing oxycodone and decrease prn to 5mg  q4H prn given affects on cognition and energy- patient and husband in agreement. Obtained XR right wrist where pain was worst--negative. Apply ice. Pain improved with therapy and at rest.  4. Mood: LCSW to follow for evaluation and support.              -antipsychotic agents: N/A 5. Neuropsych: This patient may be intermittently capable of making decisions on her own behalf. 6. Skin/Wound Care: Routine pressure relief measures. May dc IV 7. Fluids/Electrolytes/Nutrition: Monitor I/O. Monitor lytes weekly.  8. L2 burst fracture: Back precautions with brace when at EOB/OOB 9. Sternal fracture: Encourage IS.  Continue lidocaine patch.  10. Neurogenic bladder: Monitor voiding with PVR checks.              -continue FLomax->change to nights.  4/12- will increase Flomax to 0.8 mg and see if can void- per nursing, has been voiding much better today.    4/14- no issues- voiding much better-  11. Neurogenic bowel: Was incontinent during the night ( Movantik, miralax and senna taken this am).              --On colace, miralax, senna, movantik  and daily suppository but laxative/suppositories have been refused/held multiple days.               --Will order KUB to monitor for stool burden as family reports poor intake.              --will start with Senna  S two in am and suppository after supper for bowel program.  4/12- cannot assess if pt is SCI pt- but is at high risk- will determine when can check strength exam.   4/13- pt unable to push stool out- will continue suppository nightly- don't  Hold it, even if has BMs/incontinent. Add Lomotil if having too much diarrhea. Prn.  12. Acute blood loss anemia: H/H down to 9.2/94.9-->recheck in am.  13. Hyperglycemia: Likely due to stress--will order A1C for am.  14. Hypocalcemia: Ionized calcium 1.11.             --will add calcium supplement.  15. Hypertension: systolic BP mildly elevated, monitor TID.  4/14- BP 120s/70s- con't regimen 15. Baseline: patient used to walk 1-2 miles per day with her husband. Was very active around her home.   16. Hypokalemia  4/12- K+ 3.0- repleted and will recheck next 2 days.  4/13- K+ 3.8 today- will recheck Friday  17. Confusion  4/12- per pt, brain not working right- had pt seen by Neuropsych and checking head CT.   4/13- CT was negative; but still c/o confusion- Neuropsych felt pt doing better- doesn't need MRI.   4/14- is more sedated, a lot more sedated this AM- holding methadone- con't Oxycodone scheduled in AM  4/15- husband feels like pt is doing better and doesn't want SLP or any other cognitive intervention.     LOS: 6 days A FACE TO FACE EVALUATION WAS PERFORMED  Clint Bolder P Jayleon Mcfarlane 03/04/2021, 3:01 PM

## 2021-03-05 LAB — CBC
HCT: 31.8 % — ABNORMAL LOW (ref 36.0–46.0)
Hemoglobin: 10.2 g/dL — ABNORMAL LOW (ref 12.0–15.0)
MCH: 32.2 pg (ref 26.0–34.0)
MCHC: 32.1 g/dL (ref 30.0–36.0)
MCV: 100.3 fL — ABNORMAL HIGH (ref 80.0–100.0)
Platelets: 440 10*3/uL — ABNORMAL HIGH (ref 150–400)
RBC: 3.17 MIL/uL — ABNORMAL LOW (ref 3.87–5.11)
RDW: 16.4 % — ABNORMAL HIGH (ref 11.5–15.5)
WBC: 10.2 10*3/uL (ref 4.0–10.5)
nRBC: 0 % (ref 0.0–0.2)

## 2021-03-05 NOTE — Progress Notes (Signed)
Physical Therapy Session Note  Patient Details  Name: Shelby Arellano MRN: [id number] Date of Birth: [birth year]  Today's Date: 03/05/2021 PT Individual Time: 0900-1015 and 1450-1535 PT Individual Time Calculation (min): 75 min and 45 mins  Short Term Goals: Week 1:  PT Short Term Goal 1 (Week 1): pt to demonstrate supine<>sit mod A of one PT Short Term Goal 2 (Week 1): pt to demonstrate functional transfers with LRAD mod A x1 PT Short Term Goal 3 (Week 1): to initate gait with LRAD at least 25' mod A PT Short Term Goal 4 (Week 1): pt to tolerate sitting OOB 1 hour Week 2:     Skilled Therapeutic Interventions/Progress Updates:    pt received in bed and agreeable to therapy. Husband present. Pt on bed pan, NCT present for toileting needs. Pt directed in rolling min A x2 each direction with total A for brief change. Pt directed in supine>sit mod A with extra time to complete overall and single step VC to complete. TLSO donned in sitting total A. Pt then directed in x3 Sit to stand from EOB to Rolling walker at mod A with VC and tactile cues for hip extension, B knee extension, and head carriage. Pt benefits from [facility] for relaxation to increase active participation with good effect. Pt directed in last stand with nursing present to apply lidocaine patches for pt pain control. Pt then directed in Sit to stand and step transfer to [facility] mod A with pt unable to achieve with walker and ultimately required PT to assist with manual assist with trunk extension, and stepping placement to WC. Pt taken to gym in [facility] total A for time and energy. Pt directed in seated forward reaching within BOS and within spinal precautions 3x5 on R and L clothes pin retrieval from tabletop and pinning on targeted area then removed and returned to tabletop. Pt required frequent rest breaks and extra time overall throughout session for single step VC and explanation of tasks 2/2 decreased processing and sequencing. Pt returned to room,  total A in [facility], agreed to remain in [facility] husband present. Pt reported she thought she had urinated however PT checked pt's brief and this was dry. Pt informed and educated to use call light if she feels she needs to use restroom or that she had already voided in brief. Both agreed. Pt left in [name] WC, All needs in reach and in good condition. Call light in hand.   Session 2: pt received in [name] WC with OT and agreeable to therapy. PT took over post OT session. Pt had gotten hair washed with OT, and requested to have it dried. PT completed drying hair with pt able to hold head up to complete task. Pt then directed in teeth brushing at sink with setup A. Pt then agreeable to returning to bed. PT setup WC to complete transfer, mod A with PT anterior to pt and Hand held assist with manual facilitation for hip extension and trunk extension, single step cues to laterally step to R for transfer and improved position in bed. Pt then directed in static sitting at EOB 5 min for hair to be braided and doffing brace, CGA with use of bedrail. Pt directed in sit>supine mod A for BLE management. Pt left in supine, All needs in reach and in good condition. Call light in hand.  And alarm set, family present.   Therapy Documentation Precautions:  Precautions Precautions: Back,Sternal,Fall Precaution Comments: brace when OOB Required Braces or Orthoses: Spinal  Brace Spinal Brace: Thoracolumbosacral orthotic,Applied in sitting position Restrictions Weight Bearing Restrictions: No General:   Vital Signs:   Pain: Pain Assessment Pain Scale: 0-10 Pain Score: 5  Pain Type: Acute pain;Surgical pain Pain Location: Back Pain Orientation: Mid Pain Descriptors / Indicators: Aching Pain Frequency: Intermittent Pain Onset: Gradual Patients Stated Pain Goal: 1 Pain Intervention(s): Pain med given for lower pain score than stated, per patient request Multiple Pain Sites: No Mobility:   Locomotion :    Trunk/Postural  Assessment :    Balance:   Exercises:   Other Treatments:      Therapy/Group: Individual Therapy  Barbaraann Faster 03/05/2021, 12:20 PM

## 2021-03-05 NOTE — Progress Notes (Signed)
PROGRESS NOTE   Subjective/Complaints: Son said she was looking much better today and now is more confused again.  Hgb 10.2 today +diarrhea  ROS: +pain in right wrist, +spinal pain with mobility, +diarrhea  Objective:   No results found. Recent Labs    03/05/21 0514  WBC 10.2  HGB 10.2*  HCT 31.8*  PLT 440*   No results for input(s): NA, K, CL, CO2, GLUCOSE, BUN, CREATININE, CALCIUM in the last 72 hours.  Intake/Output Summary (Last 24 hours) at 03/05/2021 1610 Last data filed at 03/05/2021 0900 Gross per 24 hour  Intake 780 ml  Output --  Net 780 ml        Physical Exam: Vital Signs Blood pressure (!) 123/52, pulse 93, temperature 98 F (36.7 C), resp. rate 16, height 5\' 3"  (1.6 m), weight 57.6 kg, SpO2 96 %. Gen: no distress, normal appearing HEENT: oral mucosa pink and moist, NCAT Cardio: Reg rate Chest: normal effort, normal rate of breathing Abd: soft, non-distended Ext: no edema Psych: pleasant, normal affect  Skin: hemovac out; blisters on back incision, bruising over right wrist incisi   Musculoskeletal: . Sensation intact.  Did allow me to test DF only- at least 4/5 B/L- but said hurt too bad to do MS exam. No change   Assessment/Plan: 1. Functional deficits which require 3+ hours per day of interdisciplinary therapy in a comprehensive inpatient rehab setting.  Physiatrist is providing close team supervision and 24 hour management of active medical problems listed below.  Physiatrist and rehab team continue to assess barriers to discharge/monitor patient progress toward functional and medical goals  Care Tool:  Bathing    Body parts bathed by patient: Face   Body parts bathed by helper: Right arm,Left arm,Chest,Abdomen     Bathing assist Assist Level: 2 Helpers     Upper Body Dressing/Undressing Upper body dressing   What is the patient wearing?: Hospital gown only    Upper body  assist Assist Level: Total Assistance - Patient < 25%    Lower Body Dressing/Undressing Lower body dressing      What is the patient wearing?: Incontinence brief     Lower body assist Assist for lower body dressing: Total Assistance - Patient < 25%     Toileting Toileting    Toileting assist Assist for toileting: Dependent - Patient 0%     Transfers Chair/bed transfer  Transfers assist  Chair/bed transfer activity did not occur: Safety/medical concerns  Chair/bed transfer assist level: Maximal Assistance - Patient 25 - 49%     Locomotion Ambulation   Ambulation assist   Ambulation activity did not occur: Safety/medical concerns          Walk 10 feet activity   Assist  Walk 10 feet activity did not occur: Safety/medical concerns        Walk 50 feet activity   Assist Walk 50 feet with 2 turns activity did not occur: Safety/medical concerns         Walk 150 feet activity   Assist Walk 150 feet activity did not occur: Safety/medical concerns         Walk 10 feet on uneven surface  activity  Assist Walk 10 feet on uneven surfaces activity did not occur: Safety/medical concerns         Wheelchair     Assist Will patient use wheelchair at discharge?: Yes Type of Wheelchair: Manual Wheelchair activity did not occur: Safety/medical concerns         Wheelchair 50 feet with 2 turns activity    Assist    Wheelchair 50 feet with 2 turns activity did not occur: Safety/medical concerns       Wheelchair 150 feet activity     Assist  Wheelchair 150 feet activity did not occur: Safety/medical concerns       Blood pressure (!) 123/52, pulse 93, temperature 98 F (36.7 C), resp. rate 16, height 5\' 3"  (1.6 m), weight 57.6 kg, SpO2 96 %.  1.  Lumbar burst fracture s/p ORIF T11-L4- might be SCI with neurogenic bowel and bladder notable.              -ELOS/Goals: minA 2-3 weeks  -Continue CIR. Tolerating therapy better 2.   Antithrombotics: -DVT/anticoagulation: Continue Lovenox 40 mg daily since renal function fine             -antiplatelet therapy: N/A 3. Pain Management: Oxycodone and robaxin prn.              --Lidocaine patches x3 added  for pain control             --Tylenol 650mg  TID scheduled.   D/c standing oxycodone and decrease prn to 5mg  q4H prn given affects on cognition and energy- patient and husband in agreement. Obtained XR right wrist where pain was worst--negative. Apply ice. Pain improved with therapy and at rest.  4. Mood: LCSW to follow for evaluation and support.              -antipsychotic agents: N/A 5. Neuropsych: This patient may be intermittently capable of making decisions on her own behalf. 6. Skin/Wound Care: Routine pressure relief measures. May dc IV 7. Fluids/Electrolytes/Nutrition: Monitor I/O. Monitor lytes weekly.  8. L2 burst fracture: Back precautions with brace when at EOB/OOB 9. Sternal fracture: Encourage IS.  Continue lidocaine patch.  10. Neurogenic bladder: Monitor voiding with PVR checks.              -continue FLomax->change to nights.  4/12- will increase Flomax to 0.8 mg and see if can void- per nursing, has been voiding much better today.    4/14- no issues- voiding much better-  11. Neurogenic bowel: Was incontinent during the night ( Movantik, miralax and senna taken this am).              --On colace, miralax, senna, movantik and daily suppository but laxative/suppositories have been refused/held multiple days.               --Will order KUB to monitor for stool burden as family reports poor intake.              --will start with Senna S two in am and suppository after supper for bowel program.  4/12- cannot assess if pt is SCI pt- but is at high risk- will determine when can check strength exam.   4/13- pt unable to push stool out- will continue suppository nightly- don't  Hold it, even if has BMs/incontinent. Add Lomotil if having too much diarrhea. Prn.   12. Acute blood loss anemia: H/H down to 9.2/94.9-->recheck in am.  13. Hyperglycemia: Likely due to stress--will order A1C for am.  14.  Hypocalcemia: Ionized calcium 1.11.             --will add calcium supplement.  15. Hypertension: systolic BP mildly elevated, monitor TID.  4/14- BP 120s/70s- con't regimen 15. Baseline: patient used to walk 1-2 miles per day with her husband. Was very active around her home.   16. Hypokalemia  4/12- K+ 3.0- repleted and will recheck next 2 days.  4/13- K+ 3.8 today- will recheck Friday  17. Confusion  4/12- per pt, brain not working right- had pt seen by Neuropsych and checking head CT.   4/13- CT was negative; but still c/o confusion- Neuropsych felt pt doing better- doesn't need MRI.   4/14- is more sedated, a lot more sedated this AM- holding methadone- con't Oxycodone scheduled in AM  4/15- husband feels like pt is doing better and doesn't want SLP or any other cognitive intervention.  18. Diarrhea: was on lamotil at home- discussed restarting here with patient and family    LOS: 7 days A FACE TO FACE EVALUATION WAS PERFORMED  Moksha Dorgan P Atha Muradyan 03/05/2021, 4:10 PM

## 2021-03-05 NOTE — Plan of Care (Signed)
  Problem: Consults Goal: RH SPINAL CORD INJURY PATIENT EDUCATION Description:  See Patient Education module for education specifics.  Outcome: Progressing Goal: Skin Care Protocol Initiated - if Braden Score 18 or less Description: If consults are not indicated, leave blank or document N/A Outcome: Progressing   Problem: RH SKIN INTEGRITY Goal: RH STG MAINTAIN SKIN INTEGRITY WITH ASSISTANCE Description: STG Maintain Skin Integrity With min Assistance. Outcome: Progressing Goal: RH STG ABLE TO PERFORM INCISION/WOUND CARE W/ASSISTANCE Description: STG Able To Perform Incision/Wound Care With min Assistance. Outcome: Progressing   Problem: RH SAFETY Goal: RH STG ADHERE TO SAFETY PRECAUTIONS W/ASSISTANCE/DEVICE Description: STG Adhere to Safety Precautions With min Assistance/Device. Outcome: Progressing Goal: RH STG DECREASED RISK OF FALL WITH ASSISTANCE Description: STG Decreased Risk of Fall With min Assistance. Outcome: Progressing   Problem: RH PAIN MANAGEMENT Goal: RH STG PAIN MANAGED AT OR BELOW PT'S PAIN GOAL Description: <4 on a 0-10 pain scale Outcome: Progressing   Problem: RH KNOWLEDGE DEFICIT SCI Goal: RH STG INCREASE KNOWLEDGE OF SELF CARE AFTER SCI Description: Patient will demonstrate knowledge of medication management, pain management, skin/wound care, and weight bearing precautions with educational materials and handouts provided by staff, at discharge independently. Outcome: Progressing   

## 2021-03-05 NOTE — Progress Notes (Signed)
Occupational Therapy Session Note  Patient Details  Name: Shelby Arellano MRN: [id number] Date of Birth: 04/30/1935  Today's Date: 03/05/2021 OT Individual Time: 9476-5465 OT Individual Time Calculation (min): 41 min    Short Term Goals: Week 1:  OT Short Term Goal 1 (Week 1): Pt will maintain sitting EOB for 5 mins with supervision during self-care tasks OT Short Term Goal 2 (Week 1): Pt will complete bathing wtih mod assist of one caregiver OT Short Term Goal 3 (Week 1): Pt will complete stand pivot transfer with mod assist of one caregiver  Skilled Therapeutic Interventions/Progress Updates:    Pt greeted in bed, premedicated for pain. Spouse and son present and very helpful throughout session. Pt agreeable to transfer to the [name] to have her hair washed. Max A for supine<sit using logroll. TLSO donned with Total A while pt sat EOB. Mod A for stand pivot<w/c with 2nd person providing CGA for safety. While pt was reclined at the sink, OT used the hair washing tray to wash her hair and then OT and son assisted with detangling hair. Pt left via PT handoff at end of tx. Tx focus placed on transfers, OOB tolerance, and psychosocial wellness.    Pt throughout session with increased feelings of anxiety as well as pain c/o. Instruction provided for using diaphragmatic breathing strategies, calming cues utilized therapeutically as well  Therapy Documentation Precautions:  Precautions Precautions: Back,Sternal,Fall Precaution Comments: brace when OOB Required Braces or Orthoses: Spinal Brace Spinal Brace: Thoracolumbosacral orthotic,Applied in sitting position Restrictions Weight Bearing Restrictions: No Pain: Pain Assessment Pain Score: 2  ADL: ADL Grooming: Minimal assistance Where Assessed-Grooming: Edge of bed Upper Body Bathing: Dependent Where Assessed-Upper Body Bathing: Edge of bed Lower Body Bathing: Dependent (+2) Where Assessed-Lower Body Bathing: Edge of bed Upper Body  Dressing: Dependent Where Assessed-Upper Body Dressing: Edge of bed      Therapy/Group: Individual Therapy  Gabbi Whetstone A Ajeet Casasola 03/05/2021, 4:14 PM

## 2021-03-05 NOTE — Progress Notes (Signed)
Patient bowel program started at 1845. Patient has had no bowel movement. Program will be continued by receiving nurse. Cletis Media, LPN

## 2021-03-05 NOTE — Progress Notes (Signed)
Pt given dig stim for 15 seconds pt had small soft mushy BM about one minute after pt encouraged to push after another 15 sec dig stim pt had another soft mushy BM pt had no further bm states she feels better that the cramping is better.

## 2021-03-06 LAB — COMPREHENSIVE METABOLIC PANEL
ALT: 27 U/L (ref 0–44)
AST: 27 U/L (ref 15–41)
Albumin: 2.4 g/dL — ABNORMAL LOW (ref 3.5–5.0)
Alkaline Phosphatase: 335 U/L — ABNORMAL HIGH (ref 38–126)
Anion gap: 5 (ref 5–15)
BUN: 9 mg/dL (ref 8–23)
CO2: 26 mmol/L (ref 22–32)
Calcium: 8.9 mg/dL (ref 8.9–10.3)
Chloride: 104 mmol/L (ref 98–111)
Creatinine, Ser: 0.86 mg/dL (ref 0.44–1.00)
GFR, Estimated: >60 mL/min (ref >60)
Glucose, Bld: 96 mg/dL (ref 70–99)
Potassium: 4.5 mmol/L (ref 3.5–5.1)
Sodium: 135 mmol/L (ref 135–145)
Total Bilirubin: 0.6 mg/dL (ref 0.3–1.2)
Total Protein: 5.2 g/dL — ABNORMAL LOW (ref 6.5–8.1)

## 2021-03-06 MED ORDER — OXYCODONE HCL 5 MG PO TABS
5.0000 mg | ORAL_TABLET | Freq: Three times a day (TID) | ORAL | Status: DC | PRN
  Administered 2021-03-06: 5 mg via ORAL
  Filled 2021-03-06 (×3): qty 1

## 2021-03-06 MED ORDER — OXYCODONE HCL 5 MG PO TABS: 5.0000 mg | ORAL_TABLET | Freq: Four times a day (QID) | ORAL | Status: DC | PRN

## 2021-03-06 NOTE — Patient Care Conference (Signed)
Inpatient RehabilitationTeam Conference and Plan of Care Update Date: 03/06/2021   Time: 11:26 AM    Patient Name: Shelby Arellano      Medical Record Number: [id number]  Date of Birth: 1935-05-06 Sex: Female         Room/Bed: 4M13C/4M13C-01 Payor Info: Payor: AARP / Plan: AARP / Product Type: *No Product type* /    Admit Date/Time:  02/26/2021  2:18 PM  Primary Diagnosis:  Lumbar burst fracture, sequela  Hospital Problems: Principal Problem:   Lumbar burst fracture, sequela Active Problems:   Acute pain due to trauma    Expected Discharge Date: Expected Discharge Date: 03/21/21  Team Members Present: Physician leading conference: Dr. Sula Soda Care Coodinator Present: Lavera Guise, BSW;Manual Navarra Marlyne Beards, RN, BSN, CRRN Nurse Present: Kennyth Arnold, RN PT Present: Otelia Sergeant, PT OT Present: Earleen Newport, OT PPS Coordinator present : Fae Pippin, SLP     Current Status/Progress Goal Weekly Team Focus  Bowel/Bladder   Incontinent of B/B  LBM 04/18  pt will have increased continence  timed toileting   Swallow/Nutrition/ Hydration             ADL's   Pt requires Total-2 assist for ADL tasks, Mod A stand pivot transfer. Pt limited functionally by confusion and pain  Min A overall  Holistic pain mgt, OOB tolerance, functional transfers, general strengthening and activity tolerance, pt/family education, functional cognition   Mobility   mod A for bed mob, transfers min A- mod A, min A static sitting balance, gait 20' mod A  CGA transfers and gait 150' LRAD, supervision bed mob, supervision WC  balance transfers, gait, tolerance OOB, pain management   Communication             Safety/Cognition/ Behavioral Observations            Pain   C/O back pain medicated with oxy ad ordered with relief  pt will have pain that is <=3/10  assess for pain qshift and prn reassess for relief notify MD for uncontrolled pain   Skin   Surgical incision to back with honeycomb  dressing MASD peri, groin and buttocks barrier cream applied bruises to bilateral hands.  surgical site will remain free of infection, improvement of MASD, no further skin breakdown  assess skin qshift and prn     Discharge Planning:  Pt discharging home with spouse to provide 24/7 care. 1 level home, level entry   Team Discussion: Confusion is better, delay's are better, incision looking better. Still having urinary retention. Nursing reports patient is continent with incontinent episodes. Pain is resolved with PRN medication. Has a hard time sleeping but after taking Oxy for pain she can go to sleep. She has dressing to the incision sites on her back. Educating on medication and pain management, and weight bearing precautions. Patient on target to meet rehab goals: yes, sometimes 2 assist is needed for ADL's , mod assist with transfers. Pain is limiting. Walked 20 ft at min assist, mod assist for bed mobility, and min/mod assist for stand pivot.  *See Care Plan and progress notes for long and short-term goals.   Revisions to Treatment Plan:  Weaning the Oxycodone as tolerated.  Teaching Needs: Family education, medication management, pain management, skin/wound care, bowel/bladder management, transfer training, gait training, balance training, endurance training, safety awareness, weight bearing precautions.  Current Barriers to Discharge: Decreased caregiver support, Medical stability, Home enviroment access/layout, Incontinence, Wound care, Lack of/limited family support, Weight bearing restrictions, Medication compliance and  Behavior  Possible Resolutions to Barriers: Continue current medications, provide emotional support.     Medical Summary Current Status: confusion, insomnia, anxiety, post-operative pain, does not like Ca and K+ supplements  Barriers to Discharge: Medical stability  Barriers to Discharge Comments: confusion, insomnia, anxiety, post-operative pain, does not like Ca  and K+ supplements Possible Resolutions to [employer] Focus: wean oxycodone as tolerated, will discuss with son premobid anxiety and medication options, discussed foods that are good replacement for supplements   Continued Need for Acute Rehabilitation Level of Care: The patient requires daily medical management by a physician with specialized training in physical medicine and rehabilitation for the following reasons: Direction of a multidisciplinary physical rehabilitation program to maximize functional independence : Yes Medical management of patient stability for increased activity during participation in an intensive rehabilitation regime.: Yes Analysis of laboratory values and/or radiology reports with any subsequent need for medication adjustment and/or medical intervention. : Yes   I attest that I was present, lead the team conference, and concur with the assessment and plan of the team.   Tennis Must 03/06/2021, 6:06 PM

## 2021-03-06 NOTE — Progress Notes (Signed)
Physical Therapy Weekly Progress Note  Patient Details  Name: Shelby Arellano MRN: [id number] Date of Birth: January 29, 1935  Beginning of progress report period: February 27, 2021 End of progress report period: March 06, 2021  Today's Date: 03/06/2021 PT Individual Time: 6629-4765 PT Individual Time Calculation (min): 60 min   Patient has met  3 of 4 short term goals.  Pt has improved greatly toward all goals this past week but has not yet ambulated 25', max gait at been 20'. Pt is currently motivated to participate, limited with pain and mod A for bed mobility, min A-mod A for transfers, mod A 20' gait.   Patient continues to demonstrate the following deficits muscle weakness, decreased cardiorespiratoy endurance and decreased sitting balance, decreased standing balance, decreased postural control and decreased balance strategies and therefore will continue to benefit from skilled PT intervention to increase functional independence with mobility.  Patient progressing toward long term goals..  Continue plan of care.  PT Short Term Goals Week 1:  PT Short Term Goal 1 (Week 1): pt to demonstrate supine<>sit mod A of one PT Short Term Goal 1 - Progress (Week 1): Met PT Short Term Goal 2 (Week 1): pt to demonstrate functional transfers with LRAD mod A x1 PT Short Term Goal 2 - Progress (Week 1): Met PT Short Term Goal 3 (Week 1): to initate gait with LRAD at least 25' mod A PT Short Term Goal 3 - Progress (Week 1): Not met PT Short Term Goal 4 (Week 1): pt to tolerate sitting OOB 1 hour PT Short Term Goal 4 - Progress (Week 1): Met     Skilled Therapeutic Interventions/Progress Updates:    pt received in bed and agreeable to therapy. Husband present. Pt tearful and anxious about additional therapy session reporting she did not feel she had rested enough before starting new session but agreeable and able to calm self with VC for breathing technique and encourage from PT and husband. Pt given extra  time to process all activity and this seemed to help with pt anxiety with mobility and increase level of I. Pt directed in rolling x2 for brief management and placement of bed pan as pt reported she needed to urinate. ultimately unable and brief placed back on pt total A min A for rolling. Pt directed in supine>sit min A with single step cues and extra time to complete and min A for trunk to come to sitting. Total A for brace donning in sitting. Pt then directed in Sit to stand and step transfer to [facility] with Rolling walker at min A. Pt taken to gym total A for time. Pt directed in Sit to stand min A to Rolling walker and gait training with Rolling walker 20' mod A for stability and single step VC for sequencing, trunk extension and hip extension. Pt then returned to room, directed in Stand pivot transfer with Rolling walker back to bed min A and mod A for sit>supine for BLE management. Pt left in supine, All needs in reach and in good condition. Call light in hand.  And alarm set. husband present.    Therapy Documentation Precautions:  Precautions Precautions: Back,Sternal,Fall Precaution Comments: brace when OOB Required Braces or Orthoses: Spinal Brace Spinal Brace: Thoracolumbosacral orthotic,Applied in sitting position Restrictions Weight Bearing Restrictions: No General:   Vital Signs: Therapy Vitals Temp: 98.1 F (36.7 C) Temp Source: Oral Pulse Rate: 87 Resp: 18 BP: (!) 116/53 Patient Position (if appropriate): Lying Oxygen Therapy SpO2: 98 % O2  Device: Room Air Pain: Pain Assessment Pain Scale: 0-10 Pain Score: 0-No pain Vision/Perception     Mobility:   Locomotion :    Trunk/Postural Assessment :    Balance:   Exercises:   Other Treatments:     Therapy/Group: Individual Therapy  [name] 03/06/2021, 1:13 PM

## 2021-03-06 NOTE — Progress Notes (Signed)
PROGRESS NOTE   Subjective/Complaints: Walked 20 feet today! Continues to be incontinent Son discussed premorbid anxiety, but says that she does not like to discuss  ROS: +pain in right wrist, +spinal pain with mobility, +diarrhea, +incontinence, +depression and anxiety as per son  Objective:   No results found. Recent Labs    03/05/21 0514  WBC 10.2  HGB 10.2*  HCT 31.8*  PLT 440*   Recent Labs    03/06/21 0525  NA 135  K 4.5  CL 104  CO2 26  GLUCOSE 96  BUN 9  CREATININE 0.86  CALCIUM 8.9    Intake/Output Summary (Last 24 hours) at 03/06/2021 1138 Last data filed at 03/06/2021 0804 Gross per 24 hour  Intake 238 ml  Output --  Net 238 ml        Physical Exam: Vital Signs Blood pressure 100/70, pulse 87, temperature (!) 97.3 F (36.3 C), temperature source Oral, resp. rate (!) 21, height 5\' 3"  (1.6 m), weight 57.6 kg, SpO2 97 %. Gen: no distress, normal appearing HEENT: oral mucosa pink and moist, NCAT Cardio: Reg rate Chest: normal effort, normal rate of breathing Abd: soft, non-distended Ext: no edema Psych: pleasant, normal affect  Skin: hemovac out; blisters on back incision, bruising over right wrist incisi   Musculoskeletal: . Sensation intact.  Did allow me to test DF only- at least 4/5 B/L- but said hurt too bad to do MS exam. No change   Assessment/Plan: 1. Functional deficits which require 3+ hours per day of interdisciplinary therapy in a comprehensive inpatient rehab setting.  Physiatrist is providing close team supervision and 24 hour management of active medical problems listed below.  Physiatrist and rehab team continue to assess barriers to discharge/monitor patient progress toward functional and medical goals  Care Tool:  Bathing    Body parts bathed by patient: Face   Body parts bathed by helper: Right arm,Left arm,Chest,Abdomen     Bathing assist Assist Level: 2  Helpers     Upper Body Dressing/Undressing Upper body dressing   What is the patient wearing?: Hospital gown only    Upper body assist Assist Level: Total Assistance - Patient < 25%    Lower Body Dressing/Undressing Lower body dressing      What is the patient wearing?: Incontinence brief     Lower body assist Assist for lower body dressing: Total Assistance - Patient < 25%     Toileting Toileting    Toileting assist Assist for toileting: Dependent - Patient 0%     Transfers Chair/bed transfer  Transfers assist  Chair/bed transfer activity did not occur: Safety/medical concerns  Chair/bed transfer assist level: Maximal Assistance - Patient 25 - 49%     Locomotion Ambulation   Ambulation assist   Ambulation activity did not occur: Safety/medical concerns          Walk 10 feet activity   Assist  Walk 10 feet activity did not occur: Safety/medical concerns        Walk 50 feet activity   Assist Walk 50 feet with 2 turns activity did not occur: Safety/medical concerns         Walk 150 feet activity  Assist Walk 150 feet activity did not occur: Safety/medical concerns         Walk 10 feet on uneven surface  activity   Assist Walk 10 feet on uneven surfaces activity did not occur: Safety/medical concerns         Wheelchair     Assist Will patient use wheelchair at discharge?: Yes Type of Wheelchair: Manual Wheelchair activity did not occur: Safety/medical concerns         Wheelchair 50 feet with 2 turns activity    Assist    Wheelchair 50 feet with 2 turns activity did not occur: Safety/medical concerns       Wheelchair 150 feet activity     Assist  Wheelchair 150 feet activity did not occur: Safety/medical concerns       Blood pressure 100/70, pulse 87, temperature (!) 97.3 F (36.3 C), temperature source Oral, resp. rate (!) 21, height 5\' 3"  (1.6 m), weight 57.6 kg, SpO2 97 %.  1.  Lumbar burst  fracture s/p ORIF T11-L4- might be SCI with neurogenic bowel and bladder notable.              -ELOS/Goals: minA 2-3 weeks  -Continue CIR. Tolerating therapy better 2.  Impaired mobility: -DVT/anticoagulation: Continue Lovenox 40 mg daily since renal function fine             -antiplatelet therapy: N/A 3. Pain Management: Oxycodone and robaxin prn.              --Lidocaine patches x3 added  for pain control             --Tylenol 650mg  TID scheduled.   D/c standing oxycodone and decrease prn to 5mg  q8H prn given affects on cognition and energy- patient and husband in agreement. Obtained XR right wrist where pain was worst--negative. Apply ice. Pain improved with therapy and at rest.  4. Anxiety and depression: LCSW to follow for evaluation and support. Discussed with son her premorbid issues. Discussed starting Remeron for her anxiety and depression.              -antipsychotic agents: N/A 5. Neuropsych: This patient may be intermittently capable of making decisions on her own behalf. 6. Skin/Wound Care: Routine pressure relief measures. May dc IV 7. Fluids/Electrolytes/Nutrition: Monitor I/O. Monitor lytes weekly.  8. L2 burst fracture: Back precautions with brace when at EOB/OOB 9. Sternal fracture: Encourage IS.  Continue lidocaine patch.  10. Neurogenic bladder: Monitor voiding with PVR checks.              -continue FLomax->change to nights.  4/12- will increase Flomax to 0.8 mg and see if can void- per nursing, has been voiding much better today.    4/14- no issues- voiding much better-  11. Neurogenic bowel: Was incontinent during the night ( Movantik, miralax and senna taken this am).              --On colace, miralax, senna, movantik and daily suppository but laxative/suppositories have been refused/held multiple days.               --Will order KUB to monitor for stool burden as family reports poor intake.              --will start with Senna S two in am and suppository after supper  for bowel program.  4/12- cannot assess if pt is SCI pt- but is at high risk- will determine when can check strength exam.   4/13- pt unable  to push stool out- will continue suppository nightly- don't  Hold it, even if has BMs/incontinent. Add Lomotil if having too much diarrhea. Prn.  12. Acute blood loss anemia: H/H down to 9.2/94.9-->recheck in am.  13. Hyperglycemia: Likely due to stress--will order A1C for am.  14. Hypocalcemia: Ionized calcium 1.11.             --will add calcium supplement.  15. Hypertension: systolic BP mildly elevated, monitor TID.  4/14- BP 120s/70s- con't regimen 15. Baseline: patient used to walk 1-2 miles per day with her husband. Was very active around her home.   16. Hypokalemia  4/12- K+ 3.0- repleted and will recheck next 2 days.  4/13- K+ 3.8 today- will recheck Friday  17. Confusion  4/12- per pt, brain not working right- had pt seen by Neuropsych and checking head CT.   4/13- CT was negative; but still c/o confusion- Neuropsych felt pt doing better- doesn't need MRI.   4/14- is more sedated, a lot more sedated this AM- holding methadone- con't Oxycodone scheduled in AM  4/15- husband feels like pt is doing better and doesn't want SLP or any other cognitive intervention.  18. Diarrhea: was on lamotil at home- discussed restarting here with patient and family  >35 minutes spent in discussion patient, son, and care team regarding patient's pain, anxiety, depression, decreased appetite, patient's hobbies, patient's progress with therapy, incontinence of bowel and bladder.     LOS: 8 days A FACE TO FACE EVALUATION WAS PERFORMED  Drema Pry Aliani Caccavale 03/06/2021, 11:38 AM

## 2021-03-06 NOTE — Progress Notes (Signed)
Occupational Therapy Session Note  Patient Details  Name: Shelby Arellano MRN: [id number] Date of Birth: 06-05-1935  Today's Date: 03/06/2021 OT Individual Time: 1500-1530 OT Individual Time Calculation (min): 30 min    Short Term Goals: Week 1:  OT Short Term Goal 1 (Week 1): Pt will maintain sitting EOB for 5 mins with supervision during self-care tasks OT Short Term Goal 2 (Week 1): Pt will complete bathing wtih mod assist of one caregiver OT Short Term Goal 3 (Week 1): Pt will complete stand pivot transfer with mod assist of one caregiver  Skilled Therapeutic Interventions/Progress Updates:    Patient seated in [name] w/c, son and husband present for session.  Patient requests to use the bathroom due to urge for BM.  Utilized stedy for transfer to/from w/c, toilet and bed - requires min/mod A to stand from each surface.  Dependent for pants down/up and hygiene after successful continent void of moderate BM.  Max A to doff TLSO in sitting position.  Sit to supine with max A.  Assisted with positioning and hand hygiene.  She remained in bed at close of session, bed alarm set and call bell in reach.    Therapy Documentation Precautions:  Precautions Precautions: Back,Sternal,Fall Precaution Comments: brace when OOB Required Braces or Orthoses: Spinal Brace Spinal Brace: Thoracolumbosacral orthotic,Applied in sitting position Restrictions Weight Bearing Restrictions: No   Therapy/Group: Individual Therapy  Barrie Lyme 03/06/2021, 7:48 AM

## 2021-03-06 NOTE — Progress Notes (Signed)
Patient ID: Shelby Arellano, female   DOB: 18-Oct-1935, 86 y.o.   MRN: [id number]  Son informed of appointment with [facility] with Franchot Gallo, MD on May 16th at 11:00 AM

## 2021-03-06 NOTE — Progress Notes (Addendum)
Patient ID: Shelby Arellano, female   DOB: 11/25/1934, 86 y.o.   MRN: [id number] Team Conference Report to Patient/Family  Team Conference discussion was reviewed with the patient and caregiver, including goals, any changes in plan of care and target discharge date.  Patient and caregiver express understanding and are in agreement.  The patient has a target discharge date of 03/21/21.  SW met with pt and son from [place] in room. Pt spouse has meeting today. Sw asked to call spouse at bedtime, son reports he is in a meeting and he is able to relay message. Spouse should be present this evening. Pt concerned about a GI referral scheduled before accident. Pt will like MD to follow up with Dr. Redmond Pulling concerning her follow up, labs and what to expect moving forward. Pt thinks she has a appointment late April or May.  Per follow up pt has appointment with [facility] with Joaquin Music, MD on May 16th at 11:00 AM  Dyanne Iha 03/06/2021, 1:14 PM

## 2021-03-06 NOTE — Progress Notes (Signed)
Occupational Therapy Session Note  Patient Details  Name: Shelby Arellano MRN: [id number] Date of Birth: [birth year]  Today's Date: 03/06/2021 OT Individual Time: 6060-0459 OT Individual Time Calculation (min): 58 min    Short Term Goals: Week 1:  OT Short Term Goal 1 (Week 1): Pt will maintain sitting EOB for 5 mins with supervision during self-care tasks OT Short Term Goal 2 (Week 1): Pt will complete bathing wtih mod assist of one caregiver OT Short Term Goal 3 (Week 1): Pt will complete stand pivot transfer with mod assist of one caregiver   Skilled Therapeutic Interventions/Progress Updates:    Pt greeted at time of session semireclined in bed with son [name] present for majority of session. Supine > sit Mod/Max A with extended time and frequent cues for sequencing. Once EOB, donned brace Max A before sit > stand Mod A and stand pivot to wheelchair same manner. Note improved sitting tolreance and decreased pain compared to previous sessions. Head rest noted to be crooked, adjusted for optimal positioning at wheelchair level. Pt noted to not have eaten lunch, provided set up in [name] chair slightly reclined for comfort and able to eat with Set up. Pt stating she cannot remember what she had done today, provided memory notebook and pen for writing down what she does each day for max carryover and memory/storage retrieval. Pt agreeable to sit up in [name] until next OT session for 1 hour. Set up with alarm on call bell in reach.     Therapy Documentation Precautions:  Precautions Precautions: Back,Sternal,Fall Precaution Comments: brace when OOB Required Braces or Orthoses: Spinal Brace Spinal Brace: Thoracolumbosacral orthotic,Applied in sitting position Restrictions Weight Bearing Restrictions: No     Therapy/Group: Individual Therapy  Erasmo Score 03/06/2021, 7:26 AM

## 2021-03-06 NOTE — Progress Notes (Signed)
Physical Therapy Session Note  Patient Details  Name: Shelby Arellano MRN: [id number] Date of Birth: Jul 07, 1935  Today's Date: 03/06/2021 PT Individual Time: 7829-5621 PT Individual Time Calculation (min): 45 min   Short Term Goals: Week 1:  PT Short Term Goal 1 (Week 1): pt to demonstrate supine<>sit mod A of one PT Short Term Goal 1 - Progress (Week 1): Met PT Short Term Goal 2 (Week 1): pt to demonstrate functional transfers with LRAD mod A x1 PT Short Term Goal 2 - Progress (Week 1): Met PT Short Term Goal 3 (Week 1): to initate gait with LRAD at least 25' mod A PT Short Term Goal 3 - Progress (Week 1): Not met PT Short Term Goal 4 (Week 1): pt to tolerate sitting OOB 1 hour PT Short Term Goal 4 - Progress (Week 1): Met  Skilled Therapeutic Interventions/Progress Updates:    Pt received seated in bed, agreeable to PT session. No complaints of pain at rest, has onset of back pain and knee pain with mobility that improves at rest. Supine to sit with max A needed for BLE management and trunk elevation, heavy reliance on hospital bed features. Pt is total A to don TLSO while seated EOB. Sit to stand with mod HHA, increased time and encouragement needed for upright posture. Pt reports urge to have a BM. Stand pivot transfer bed to/from [facility] with max HHA. Pt able to continently void while seated on BSC, see Flowsheet for details. Pt is dependent for pericare and donning of new brief, only able to stand with RW and assist from a 2nd person while pericare performed. Pt returned to supine with mod A for BLE management. Pt left seated in bed with needs in reach, bed alarm in place, husband present at end of session.  Therapy Documentation Precautions:  Precautions Precautions: Back,Sternal,Fall Precaution Comments: brace when OOB Required Braces or Orthoses: Spinal Brace Spinal Brace: Thoracolumbosacral orthotic,Applied in sitting position Restrictions Weight Bearing Restrictions:  No   Therapy/Group: Individual Therapy   Excell Seltzer, PT, DPT, CSRS  03/06/2021, 3:31 PM

## 2021-03-07 LAB — URINALYSIS, ROUTINE W REFLEX MICROSCOPIC
Bilirubin Urine: NEGATIVE
Glucose, UA: NEGATIVE mg/dL
Ketones, ur: 5 mg/dL — AB
Nitrite: NEGATIVE
Protein, ur: 30 mg/dL — AB
Specific Gravity, Urine: 1.016 (ref 1.005–1.030)
WBC, UA: >50 WBC/hpf — ABNORMAL HIGH (ref 0–5)
pH: 6 (ref 5.0–8.0)

## 2021-03-07 MED ORDER — OXYCODONE HCL 5 MG PO TABS
5.0000 mg | ORAL_TABLET | Freq: Every day | ORAL | Status: DC
  Administered 2021-03-07 – 2021-03-08 (×2): 5 mg via ORAL
  Filled 2021-03-07 (×2): qty 1

## 2021-03-07 MED ORDER — MIRTAZAPINE 15 MG PO TABS
7.5000 mg | ORAL_TABLET | Freq: Every day | ORAL | Status: DC
  Administered 2021-03-07 – 2021-03-19 (×12): 7.5 mg via ORAL
  Filled 2021-03-07 (×13): qty 1

## 2021-03-07 MED ORDER — FLAVOXATE HCL 100 MG PO TABS
100.0000 mg | ORAL_TABLET | Freq: Three times a day (TID) | ORAL | Status: DC | PRN
  Filled 2021-03-07: qty 1

## 2021-03-07 MED ORDER — OXYCODONE HCL 5 MG PO TABS
2.5000 mg | ORAL_TABLET | Freq: Three times a day (TID) | ORAL | Status: DC | PRN
  Administered 2021-03-07 – 2021-03-09 (×3): 2.5 mg via ORAL
  Filled 2021-03-07 (×4): qty 1

## 2021-03-07 NOTE — Progress Notes (Signed)
Physical Therapy Session Note  Patient Details  Name: Shelby Arellano MRN: [id number] Date of Birth: 02-21-1935  Today's Date: 03/07/2021 PT Individual Time: 8921-1941 PT Individual Time Calculation (min): 39 min   Short Term Goals: Week 2:  PT Short Term Goal 1 (Week 2): pt to demonstrate min A supine<>sit PT Short Term Goal 2 (Week 2): pt to demonstrate functional transfers with LRAD min A consistently PT Short Term Goal 3 (Week 2): pt to demonstrate 50' min A gait with LRAD PT Short Term Goal 4 (Week 2): pt to tolerate standing at min A for at least 5 mins  Skilled Therapeutic Interventions/Progress Updates:    Pt received sitting in [name] wheelchair wearing TLSO with her husband and son present. Pt reports she recently called nursing for toileting assistance - pt agreeable to therapist assistance and to therapy session. Sit<>stands throughout session to/from [name] w/c and BSC with min/mod assist for lifting to stand - cuing to push up with B UEs on armrests - pt stood with B UE support around therapist's shoulders (though due to height of therapist this contributed to increased trunk/hip flexed posturing). Stand pivot w/c<>BSC with B UE support around therapist's shoulders with mod assist for balance while turning - some fatigue noted in B LEs but no knee buckling, continues to maintain flexed posturing. Standing with min/mod assist for balance and B UE support while +2 assist performed total assist LB clothing management and peri-care. Pt had started to be incontinent of BM and was further able to continently void. While sitting on BSC pt demos some confusion stating she needs assistance getting to the toilet in order to void - reoriented to her position on BSC. Pt request to return to bed for the evening now that she has finished therapy. R stand pivot w/c>EOB using B UE support around therapist with min/mod assist for lifting and balance while turning. During transfers pt noted to moan with  discomfort but no complaints of pain during session - therapist provided conversation and distraction for pain management. Sitting EOB doffed TLSO total assist. Sit>supine via reverse logroll technique with step-by-step cuing for sequencing and mod assist for B LE management into the bed. In supine performed B LE active assisted heel slides x10 reps each LE - demos some more difficulty/weakness in L LE compared to RLE. Pt left supine in bed with needs in reach, bed alarm on, and her family present.  Therapy Documentation Precautions:  Precautions Precautions: Back,Sternal,Fall Precaution Comments: brace when OOB Required Braces or Orthoses: Spinal Brace Spinal Brace: Thoracolumbosacral orthotic,Applied in sitting position Restrictions Weight Bearing Restrictions: No  Pain:   Moans with discomfort during transfers and standing but does not complain of specific pain during session - provided conversations as distractions for pain management as well as repositioning and rest.    Therapy/Group: Individual Therapy  Ginny Forth , PT, DPT, CSRS  03/07/2021, 5:42 PM

## 2021-03-07 NOTE — Progress Notes (Signed)
PROGRESS NOTE   Subjective/Complaints: Slept great last night for the first time.  Son discussed with me her history of anxiety.  Pain very well controlled- discussed decreasing oxycodone to 2.5mg  q8H prn.   ROS: +pain in right wrist, +spinal pain with mobility, +diarrhea, +incontinence, +depression and anxiety as per son, +dysuria  Objective:   No results found. Recent Labs    03/05/21 0514  WBC 10.2  HGB 10.2*  HCT 31.8*  PLT 440*   Recent Labs    03/06/21 0525  NA 135  K 4.5  CL 104  CO2 26  GLUCOSE 96  BUN 9  CREATININE 0.86  CALCIUM 8.9    Intake/Output Summary (Last 24 hours) at 03/07/2021 0947 Last data filed at 03/06/2021 1843 Gross per 24 hour  Intake 240 ml  Output --  Net 240 ml        Physical Exam: Vital Signs Blood pressure (!) 96/51, pulse 92, temperature (!) 97.5 F (36.4 C), resp. rate 20, height 5\' 3"  (1.6 m), weight 57.6 kg, SpO2 100 %. Gen: no distress, normal appearing HEENT: oral mucosa pink and moist, NCAT Cardio: Reg rate Chest: normal effort, normal rate of breathing Abd: soft, non-distended Ext: no edema Psych: pleasant, normal affect  Skin: hemovac out; blisters on back incision, bruising over right wrist incisi   Musculoskeletal: . Sensation intact.  Did allow me to test DF only- at least 4/5 B/L- but said hurt too bad to do MS exam. No change   Assessment/Plan: 1. Functional deficits which require 3+ hours per day of interdisciplinary therapy in a comprehensive inpatient rehab setting.  Physiatrist is providing close team supervision and 24 hour management of active medical problems listed below.  Physiatrist and rehab team continue to assess barriers to discharge/monitor patient progress toward functional and medical goals  Care Tool:  Bathing    Body parts bathed by patient: Face   Body parts bathed by helper: Right arm,Left arm,Chest,Abdomen      Bathing assist Assist Level: 2 Helpers     Upper Body Dressing/Undressing Upper body dressing   What is the patient wearing?: Hospital gown only    Upper body assist Assist Level: Total Assistance - Patient < 25%    Lower Body Dressing/Undressing Lower body dressing      What is the patient wearing?: Skirt     Lower body assist Assist for lower body dressing: Total Assistance - Patient < 25%     Toileting Toileting    Toileting assist Assist for toileting: Dependent - Patient 0%     Transfers Chair/bed transfer  Transfers assist  Chair/bed transfer activity did not occur: Safety/medical concerns  Chair/bed transfer assist level: Moderate Assistance - Patient 50 - 74%     Locomotion Ambulation   Ambulation assist   Ambulation activity did not occur: Safety/medical concerns          Walk 10 feet activity   Assist  Walk 10 feet activity did not occur: Safety/medical concerns        Walk 50 feet activity   Assist Walk 50 feet with 2 turns activity did not occur: Safety/medical concerns  Walk 150 feet activity   Assist Walk 150 feet activity did not occur: Safety/medical concerns         Walk 10 feet on uneven surface  activity   Assist Walk 10 feet on uneven surfaces activity did not occur: Safety/medical concerns         Wheelchair     Assist Will patient use wheelchair at discharge?: Yes Type of Wheelchair: Manual Wheelchair activity did not occur: Safety/medical concerns         Wheelchair 50 feet with 2 turns activity    Assist    Wheelchair 50 feet with 2 turns activity did not occur: Safety/medical concerns       Wheelchair 150 feet activity     Assist  Wheelchair 150 feet activity did not occur: Safety/medical concerns       Blood pressure (!) 96/51, pulse 92, temperature (!) 97.5 F (36.4 C), resp. rate 20, height 5\' 3"  (1.6 m), weight 57.6 kg, SpO2 100 %.  1.  Lumbar burst fracture  s/p ORIF T11-L4- might be SCI with neurogenic bowel and bladder notable.              -ELOS/Goals: minA 2-3 weeks  -Continue CIR. Tolerating therapy better 2.  Impaired mobility: -DVT/anticoagulation: Continue Lovenox 40 mg daily since renal function fine             -antiplatelet therapy: N/A 3. Pain Management: Oxycodone and robaxin prn.              --Lidocaine patches x3 added  for pain control             --Tylenol 650mg  TID scheduled.   D/c standing oxycodone and decrease prn to 2.5mg  q8H prn given affects on cognition and energy- patient and husband in agreement. Obtained XR right wrist where pain was worst--negative. Apply ice. Pain improved with therapy and at rest.  4. Anxiety and depression: LCSW to follow for evaluation and support. Discussed with son her premorbid issues. Discussed starting Remeron for her anxiety and depression and son and husband agreeable. Start 7.5mg  4/20.              -antipsychotic agents: N/A 5. Neuropsych: This patient may be intermittently capable of making decisions on her own behalf. 6. Skin/Wound Care: Routine pressure relief measures. May dc IV 7. Fluids/Electrolytes/Nutrition: Monitor I/O. Monitor lytes weekly.  8. L2 burst fracture: Back precautions with brace when at EOB/OOB 9. Sternal fracture: Encourage IS.  Continue lidocaine patch.  10. Neurogenic bladder: Monitor voiding with PVR checks.              -continue FLomax->change to nights.  4/12- will increase Flomax to 0.8 mg and see if can void- per nursing, has been voiding much better today.    4/14- no issues- voiding much better-  11. Neurogenic bowel: Was incontinent during the night ( Movantik, miralax and senna taken this am).              --On colace, miralax, senna, movantik and daily suppository but laxative/suppositories have been refused/held multiple days.               --Will order KUB to monitor for stool burden as family reports poor intake.              --will start with Senna  S two in am and suppository after supper for bowel program.  4/12- cannot assess if pt is SCI pt- but is at high risk- will  determine when can check strength exam.   4/13- pt unable to push stool out- will continue suppository nightly- don't  Hold it, even if has BMs/incontinent. Add Lomotil if having too much diarrhea. Prn.  12. Acute blood loss anemia: H/H down to 9.2/94.9-->recheck in am.  13. Hyperglycemia: Likely due to stress--will order A1C for am.  14. Hypocalcemia: Ionized calcium 1.11.             --will add calcium supplement.  15. Hypertension: systolic BP mildly elevated, monitor TID.  4/14- BP 120s/70s- con't regimen 15. Baseline: patient used to walk 1-2 miles per day with her husband. Was very active around her home.   16. Hypokalemia  4/12- K+ 3.0- repleted and will recheck next 2 days.  4/13- K+ 3.8 today- will recheck Friday  17. Confusion  4/12- per pt, brain not working right- had pt seen by Neuropsych and checking head CT.   4/13- CT was negative; but still c/o confusion- Neuropsych felt pt doing better- doesn't need MRI.   4/14- is more sedated, a lot more sedated this AM- holding methadone- con't Oxycodone scheduled in AM  4/15- husband feels like pt is doing better and doesn't want SLP or any other cognitive intervention.  18. Diarrhea: d/c dulcolax suppository.  19. Dysuria: obtain UA/UC.    LOS: 9 days A FACE TO FACE EVALUATION WAS PERFORMED  Horton Chin 03/07/2021, 9:47 AM

## 2021-03-07 NOTE — Progress Notes (Signed)
Occupational Therapy Weekly Progress Note  Patient Details  Name: Shelby Arellano MRN: [id number] Date of Birth: Jul 05, 1935  Beginning of progress report period: February 27, 2021 End of progress report period: March 07, 2021  Today's Date: 03/07/2021 OT Individual Time: 4098-1191 OT Individual Time Calculation (min): 50 min  and Today's Date: 03/07/2021 OT Missed Time: 10 Minutes Missed Time Reason: Nursing care   Patient has met 2 of 3 short term goals.  Pt has shown significant improvement since time of eval as pt is now Max A of 1 for bed mobility primarily limited by pain, sit <> stands with Mod-Max A at RW and/or HHA of 1, dependent with toilet hygiene as pt is able to static stand for staff to assist with hygiene and clothing, stand pivot transfers with Mod A overall. Pt continues to be limited by inconsistent back pain and fluctuating cognition. Difficulty with sequencing and problem solving at times that improves with simple cues.   Patient continues to demonstrate the following deficits: muscle weakness, decreased cardiorespiratoy endurance, impaired timing and sequencing, decreased coordination and decreased motor planning, decreased motor planning and decreased sitting balance, decreased standing balance, decreased postural control and decreased balance strategies and therefore will continue to benefit from skilled OT intervention to enhance overall performance with BADL and Reduce care partner burden.  Patient progressing toward long term goals..  Continue plan of care.  OT Short Term Goals Week 1:  OT Short Term Goal 1 (Week 1): Pt will maintain sitting EOB for 5 mins with supervision during self-care tasks OT Short Term Goal 1 - Progress (Week 1): Met OT Short Term Goal 2 (Week 1): Pt will complete bathing wtih mod assist of one caregiver OT Short Term Goal 2 - Progress (Week 1): Progressing toward goal OT Short Term Goal 3 (Week 1): Pt will complete stand pivot transfer with mod  assist of one caregiver OT Short Term Goal 3 - Progress (Week 1): Met Week 2:  OT Short Term Goal 1 (Week 2): Pt will perform stand pivot transfer Min A of one caregiver OT Short Term Goal 2 (Week 2): Pt will consistently perform sit <> stands Min A to decrease caregiver burden OT Short Term Goal 3 (Week 2): Pt will thread LEs for LB dress with reacher no more than Mod A OT Short Term Goal 4 (Week 2): Pt will perform UB ADLs with no more than Min A seated EOB or sink  Skilled Therapeutic Interventions/Progress Updates:    Pt greeted at time of session on commode with NT present, OT to resume care and assist with standing for NT to complete dependent pericare and hygiene. Missed 10 minutes at beginning of session d/t nursing care/MD care with pt family and OT stepping out, returning 10 minutes later. After dependent toilet hygiene and clothing management, stedy toilet > [name] chair with Mod/Max for sit > stand and cues hip extension. Once in [name], focus of session on back precautions, LB dressing with skirt total A, donning brace, and reviewing precautions. Adjusted head rest for [name] chair to prevent neck flexion and improve comfort. Positioned in chair with alarm on call bell in reach, brace on, and pt agreeable to sit up for next PT session.    Therapy Documentation Precautions:  Precautions Precautions: Back,Sternal,Fall Precaution Comments: brace when OOB Required Braces or Orthoses: Spinal Brace Spinal Brace: Thoracolumbosacral orthotic,Applied in sitting position Restrictions Weight Bearing Restrictions: No    Therapy/Group: Individual Therapy  Viona Gilmore 03/07/2021, 7:22 AM

## 2021-03-07 NOTE — Progress Notes (Signed)
Physical Therapy Session Note  Patient Details  Name: Shelby Arellano MRN: [id number] Date of Birth: December 20, 1934  Today's Date: 03/07/2021 PT Individual Time: 1018-1100 and 1500-1530 PT Individual Time Calculation (min): 42 min and 30 min  Short Term Goals: Week 1:  PT Short Term Goal 1 (Week 1): pt to demonstrate supine<>sit mod A of one PT Short Term Goal 1 - Progress (Week 1): Met PT Short Term Goal 2 (Week 1): pt to demonstrate functional transfers with LRAD mod A x1 PT Short Term Goal 2 - Progress (Week 1): Met PT Short Term Goal 3 (Week 1): to initate gait with LRAD at least 25' mod A PT Short Term Goal 3 - Progress (Week 1): Not met PT Short Term Goal 4 (Week 1): pt to tolerate sitting OOB 1 hour PT Short Term Goal 4 - Progress (Week 1): Met  Skilled Therapeutic Interventions/Progress Updates:   First session:  Pt presents sitting in [name] w/c and requesting need to use BR.  Pt agreeable to participate.  Pt states pain to LB 9/10 even though given pain meds prior to rx.  Pt unable to attempt transfers from w/c to toilet 2/2 urgency.  Pt performed sit to stand to Stedyw/ mod A and wheeled to BR.  Pt stood and required total A for managing clothing.  Pt unable to have BM 2/2 pain.  Pt unable to clean self and PT performed w/o any evidence of BM, nursing notified.  Pt returned to w/c w/ Stedy and then moved closer to bed for SPT w/c > bed w/ mod A.  Pt then performed log roll to right sidelying w/ min A and rolls to supine w/ cueing and min A.  Pt remained in bed and bed alarm on w/ all needs in reach.  Family present.  Second session:  Pt presents supine in bed and agreeable to therapy, w/ decreased pain.  Pt Required total A to thread pants over fett and pt transitions to hooklying and pulls pants up to hips.  Pt requires max A for rolling side to side to pull pants over hips.  Pt performed sidelying to sit w/ mod A and sat EOB for donning TLSO brace.  Pt transferred sit to stand w/ mod A and  step-pivot to w/c.  Pt educated on scooting back into w/c w/ min A.  Pt tilted back in [name] w/c for comfort and wheeled to gym.  Pt tscooted self forward w/ time and the sit to stand w/ mod A to RW.  Pt unable to amb today.  Pt stepped to w/c and scooted back into seat.  Pt returned to room and remained tilted back w/ chair alarm on in w/c.  Family present.     Therapy Documentation Precautions:  Precautions Precautions: Back,Sternal,Fall Precaution Comments: brace when OOB Required Braces or Orthoses: Spinal Brace Spinal Brace: Thoracolumbosacral orthotic,Applied in sitting position Restrictions Weight Bearing Restrictions: No General:   Vital Signs:  Pain: 9/10 back        Therapy/Group: Individual Therapy  Ladoris Gene 03/07/2021, 11:08 AM

## 2021-03-08 LAB — URINE CULTURE: Culture: <10000 — AB

## 2021-03-08 MED ORDER — OXYCODONE HCL 5 MG PO TABS
5.0000 mg | ORAL_TABLET | Freq: Every day | ORAL | Status: DC
Start: 2021-03-09 — End: 2021-03-12
  Administered 2021-03-09 – 2021-03-12 (×4): 5 mg via ORAL
  Filled 2021-03-08 (×4): qty 1

## 2021-03-08 NOTE — Progress Notes (Signed)
PROGRESS NOTE   Subjective/Complaints: Slept well again last night Was not too drowsy this morning Did not receive oxycodone prior to AM therpy: changed order to daily at 7:30am  ROS: +pain in right wrist, +spinal pain with mobility, +diarrhea, +incontinence, +depression and anxiety as per son, +dysuria, insomnia has resolved  Objective:   No results found. No results for input(s): WBC, HGB, HCT, PLT in the last 72 hours. Recent Labs    03/06/21 0525  NA 135  K 4.5  CL 104  CO2 26  GLUCOSE 96  BUN 9  CREATININE 0.86  CALCIUM 8.9    Intake/Output Summary (Last 24 hours) at 03/08/2021 1258 Last data filed at 03/07/2021 2013 Gross per 24 hour  Intake 240 ml  Output --  Net 240 ml        Physical Exam: Vital Signs Blood pressure 105/64, pulse 71, temperature 97.8 F (36.6 C), temperature source Axillary, resp. rate 18, height 5\' 3"  (1.6 m), weight 57.6 kg, SpO2 100 %. Gen: no distress, normal appearing HEENT: oral mucosa pink and moist, NCAT Cardio: Reg rate Chest: normal effort, normal rate of breathing Abd: soft, non-distended Ext: no edema Psych: pleasant, normal affect Skin: hemovac out; blisters on back incision, bruising over right wrist incisi   Musculoskeletal: . Sensation intact.  Did allow me to test DF only- at least 4/5 B/L- but said hurt too bad to do MS exam. No change   Assessment/Plan: 1. Functional deficits which require 3+ hours per day of interdisciplinary therapy in a comprehensive inpatient rehab setting.  Physiatrist is providing close team supervision and 24 hour management of active medical problems listed below.  Physiatrist and rehab team continue to assess barriers to discharge/monitor patient progress toward functional and medical goals  Care Tool:  Bathing    Body parts bathed by patient: Face   Body parts bathed by helper: Right arm,Left arm,Chest,Abdomen      Bathing assist Assist Level: 2 Helpers     Upper Body Dressing/Undressing Upper body dressing   What is the patient wearing?: Hospital gown only    Upper body assist Assist Level: Total Assistance - Patient < 25%    Lower Body Dressing/Undressing Lower body dressing      What is the patient wearing?: Skirt     Lower body assist Assist for lower body dressing: Total Assistance - Patient < 25%     Toileting Toileting    Toileting assist Assist for toileting: Dependent - Patient 0%     Transfers Chair/bed transfer  Transfers assist  Chair/bed transfer activity did not occur: Safety/medical concerns  Chair/bed transfer assist level: Minimal Assistance - Patient > 75%     Locomotion Ambulation   Ambulation assist   Ambulation activity did not occur: Safety/medical concerns  Assist level: 2 helpers Assistive device: Walker-rolling Max distance: 27   Walk 10 feet activity   Assist  Walk 10 feet activity did not occur: Safety/medical concerns  Assist level: 2 helpers Assistive device: Walker-rolling   Walk 50 feet activity   Assist Walk 50 feet with 2 turns activity did not occur: Safety/medical concerns         Walk 150  feet activity   Assist Walk 150 feet activity did not occur: Safety/medical concerns         Walk 10 feet on uneven surface  activity   Assist Walk 10 feet on uneven surfaces activity did not occur: Safety/medical concerns         Wheelchair     Assist Will patient use wheelchair at discharge?: Yes Type of Wheelchair: Manual Wheelchair activity did not occur: Safety/medical concerns         Wheelchair 50 feet with 2 turns activity    Assist    Wheelchair 50 feet with 2 turns activity did not occur: Safety/medical concerns       Wheelchair 150 feet activity     Assist  Wheelchair 150 feet activity did not occur: Safety/medical concerns       Blood pressure 105/64, pulse 71, temperature  97.8 F (36.6 C), temperature source Axillary, resp. rate 18, height 5\' 3"  (1.6 m), weight 57.6 kg, SpO2 100 %.  1.  Lumbar burst fracture s/p ORIF T11-L4- might be SCI with neurogenic bowel and bladder notable.              -ELOS/Goals: minA 2-3 weeks  -Continue CIR. Tolerating therapy better 2.  Impaired mobility: -DVT/anticoagulation: Continue Lovenox 40 mg daily since renal function fine             -antiplatelet therapy: N/A 3. Pain Management: Oxycodone and robaxin prn.              --Lidocaine patches x3 added  for pain control             --Tylenol 650mg  TID scheduled.   -added scheduled oxycodone 5mg  daily 7:30am.   Decrease prn oxycodone to 2.5mg  q8H prn given affects on cognition and energy- patient and husband in agreement. Obtained XR right wrist where pain was worst--negative. Apply ice. Pain improved with therapy and at rest.  4. Anxiety and depression: LCSW to follow for evaluation and support. Discussed with son her premorbid issues. Discussed starting Remeron for her anxiety and depression and son and husband agreeable. Start 7.5mg  4/20. Well tolerated, continue             -antipsychotic agents: N/A 5. Neuropsych: This patient may be intermittently capable of making decisions on her own behalf. 6. Skin/Wound Care: Routine pressure relief measures. May dc IV 7. Fluids/Electrolytes/Nutrition: Monitor I/O. Monitor lytes weekly.  8. L2 burst fracture: Back precautions with brace when at EOB/OOB 9. Sternal fracture: Encourage IS.  Continue lidocaine patch.  10. Neurogenic bladder: voiding well, incontinence, placed order for timed voiding q2H while awake.  11. Neurogenic bowel: Was incontinent during the night ( Movantik, miralax and senna taken this am).              --On colace, miralax, senna, movantik and daily suppository but laxative/suppositories have been refused/held multiple days.               --Will order KUB to monitor for stool burden as family reports poor  intake.              --will start with Senna S two in am and suppository after supper for bowel program.  4/12- cannot assess if pt is SCI pt- but is at high risk- will determine when can check strength exam.   4/13- pt unable to push stool out- will continue suppository nightly- don't  Hold it, even if has BMs/incontinent. Add Lomotil if having too much diarrhea. Prn.  12. Acute blood loss anemia: H/H down to 9.2/94.9-->recheck in am.  13. Hyperglycemia: Likely due to stress--will order A1C for am.  14. Hypocalcemia: Ionized calcium 1.11.             --will add calcium supplement.  15. Hypertension: systolic BP mildly elevated, monitor TID.  4/14- BP 120s/70s- con't regimen 15. Baseline: patient used to walk 1-2 miles per day with her husband. Was very active around her home.   16. Hypokalemia  4/12- K+ 3.0- repleted and will recheck next 2 days.  4/13- K+ 3.8 today- will recheck Friday  17. Confusion  4/12- per pt, brain not working right- had pt seen by Neuropsych and checking head CT.   4/13- CT was negative; but still c/o confusion- Neuropsych felt pt doing better- doesn't need MRI.   4/14- is more sedated, a lot more sedated this AM- holding methadone- con't Oxycodone scheduled in AM  4/15- husband feels like pt is doing better and doesn't want SLP or any other cognitive intervention.  18. Diarrhea: d/c dulcolax suppository.  19. Dysuria:  UA weakly positive but UC with insignificant growth- repeat today   LOS: 10 days A FACE TO FACE EVALUATION WAS PERFORMED  Drema Pry Charnise Lovan 03/08/2021, 12:58 PM

## 2021-03-08 NOTE — Progress Notes (Signed)
Physical Therapy Session Note  Patient Details  Name: Shelby Arellano MRN: [id number] Date of Birth: 25-Jun-1935  Today's Date: 03/08/2021 PT Individual Time: 1015-1110 PT Individual Time Calculation (min): 55 min  and Today's Date: 03/08/2021 PT Missed Time: 20 Minutes Missed Time Reason: Patient fatigue  Short Term Goals: Week 2:  PT Short Term Goal 1 (Week 2): pt to demonstrate min A supine<>sit PT Short Term Goal 2 (Week 2): pt to demonstrate functional transfers with LRAD min A consistently PT Short Term Goal 3 (Week 2): pt to demonstrate 50' min A gait with LRAD PT Short Term Goal 4 (Week 2): pt to tolerate standing at min A for at least 5 mins  Skilled Therapeutic Interventions/Progress Updates:    Patient received supine in bed, husband at bedside. She reports "moderate" pain in her back, did not rate- premedicated. She also reports significant fatigue and required extensive encouragement to participate in therapy to her tolerance. PT providing rest breaks, distractions and repositioning to assist with pain management. She reports that she received her AM pain rx late and that now she feels as though she's "chasing" the pain, which has made her fatigued. She was able to come sit edge of bed with MinA and verbal cues for log roll technique. Static sitting balance EOB with B UE support and close supervision. PT donning TLSO MaxA EOB. ModA stand pivot to [name] wc. PT transporting patient in wc to therapy gym for time management and energy conservation. Patient ambulating 78ft with RW and MinA + wc follow due to fatigue levels. Very slow gait speed, forward flexed posture, NBOS maintained. Patient completing seated there ex: LAQ 2x8, chest press 2# dowel 2x8. Patient requesting to return to her room due to fatigue and use bathroom before returning to bed. ModA stand picot to toilet with 2nd assist for clothing management in standing. Continent of bladder. TotalA for perihygiene and clothing management  in standing with ModA transfer back to wc. MInA stand pivot to bed. MinA to return supine. Bed alarm on, call light within reach, husband at bedside.   Therapy Documentation Precautions:  Precautions Precautions: Back,Sternal,Fall Precaution Comments: brace when OOB Required Braces or Orthoses: Spinal Brace Spinal Brace: Thoracolumbosacral orthotic,Applied in sitting position Restrictions Weight Bearing Restrictions: No    Therapy/Group: Individual Therapy  Elizebeth Koller, PT, DPT, CBIS  03/08/2021, 7:44 AM

## 2021-03-08 NOTE — Progress Notes (Signed)
Physical Therapy Session Note  Patient Details  Name: Shelby Arellano MRN: [id number] Date of Birth: [birth year]  Today's Date: 03/08/2021 PT Individual Time: 0800-0856 PT Individual Time Calculation (min): 56 min   Short Term Goals: Week 1:  PT Short Term Goal 1 (Week 1): pt to demonstrate supine<>sit mod A of one PT Short Term Goal 1 - Progress (Week 1): Met PT Short Term Goal 2 (Week 1): pt to demonstrate functional transfers with LRAD mod A x1 PT Short Term Goal 2 - Progress (Week 1): Met PT Short Term Goal 3 (Week 1): to initate gait with LRAD at least 25' mod A PT Short Term Goal 3 - Progress (Week 1): Not met PT Short Term Goal 4 (Week 1): pt to tolerate sitting OOB 1 hour PT Short Term Goal 4 - Progress (Week 1): Met Week 2:  PT Short Term Goal 1 (Week 2): pt to demonstrate min A supine<>sit PT Short Term Goal 2 (Week 2): pt to demonstrate functional transfers with LRAD min A consistently PT Short Term Goal 3 (Week 2): pt to demonstrate 50' min A gait with LRAD PT Short Term Goal 4 (Week 2): pt to tolerate standing at min A for at least 5 mins  Skilled Therapeutic Interventions/Progress Updates:   Received pt sitting on commode with husband present at bedside, pt agreeable to therapy, and reported pain 3/10 in low back but reported the pain is much better now than it was. Session with focus on toileting, functional mobility/transfers, generalized strengthening, dynamic standing balance/coordination, gait training, and improved activity tolerance. Pt with small loose BM. Donned skirt with max A and pt transferred sit<>stand in [place] with min A and required total A for peri-care and max A to pull pants/brief over hips. Dependent transfer to [name] WC in [place]. TLSO donned with total A and adjusted for comfort. Pt sat in [name] and brushed teeth with set up assist but unable to flex forward to turn sink on/off. Pt transported to ortho gym in [name] WC total A and transferred sit<>stand with RW and  mod A with cues for hand placement on WC armrests and on RW. Pt ambulated 74f with RW and min/mod A with significantly increased time due to decreased cadence and pain. Pt demonstrated flexed trunk, downward gaze, shuffling gait pattern, and decreased bilateral foot clearance and required encouragement to continue. Pt performed the following exercise sitting in [name] WC with feet supported on 6in step with supervision and verbal/visual cues for technique: -LAQ x8 bilaterally  -hip flexion x8 bilaterally  -hip adduction ball squeezes x8 -bicep curls with 2lb dowel x8 Pt transported back to room in [name] WC total A. Concluded session with pt sitting in [name] WC, needs within reach, and seatbelt alarm on. MD present at beside for morning rounds and RN present administering pain medications.   Therapy Documentation Precautions:  Precautions Precautions: Back,Sternal,Fall Precaution Comments: brace when OOB Required Braces or Orthoses: Spinal Brace Spinal Brace: Thoracolumbosacral orthotic,Applied in sitting position Restrictions Weight Bearing Restrictions: No  Therapy/Group: Individual Therapy AAlfonse AlpersPT, DPT   03/08/2021, 7:15 AM

## 2021-03-08 NOTE — Progress Notes (Signed)
Occupational Therapy Session Note  Patient Details  Name: Shelby Arellano MRN: [id number] Date of Birth: 1935-08-03  Today's Date: 03/08/2021 OT Individual Time: 1400-1530 OT Individual Time Calculation (min): 90 min    Short Term Goals: Week 2:  OT Short Term Goal 1 (Week 2): Pt will perform stand pivot transfer Min A of one caregiver OT Short Term Goal 2 (Week 2): Pt will consistently perform sit <> stands Min A to decrease caregiver burden OT Short Term Goal 3 (Week 2): Pt will thread LEs for LB dress with reacher no more than Mod A OT Short Term Goal 4 (Week 2): Pt will perform UB ADLs with no more than Min A seated EOB or sink  Skilled Therapeutic Interventions/Progress Updates:    Patient seated on toilet with nursing at start of session, husband present for session.  She was able to pass urine but no BM.  Sit to stand from toilet to stedy min/mod A, dependent for hygiene and clothing management.  stedy to w/c  - completed hair care and hand hygiene with set up.  Pain appears to be controlled at start of session.  To therapy gym via w/c - attempted to completed light w/c level activities to include leg repositioning, AAROM, light arm exercises with poor tolerance due to pain - no change with chair tilt unable to distract patient - she remained focused on timing of pain meds.  Returned to bed via stedy - min A to stand from w/c surface.  Max A to remove TLSO and move from sit to supine.  She was calm and able to participate in more light activity upon return to bed.  Requested to attempt toileting again.  Supine to sit edge of bed with mod/max A.  Max A to donn TLSO.  Sit to stand from elevated bed surface to stedy with min A.  Continent of urine, no BM.  Returned to bed again in same manner.  She was resting comfortably in bed at close of session, bed alarm set and call bell in reach.    Therapy Documentation Precautions:  Precautions Precautions: Back,Sternal,Fall Precaution Comments:  brace when OOB Required Braces or Orthoses: Spinal Brace Spinal Brace: Thoracolumbosacral orthotic,Applied in sitting position Restrictions Weight Bearing Restrictions: No  Therapy/Group: Individual Therapy  Barrie Lyme 03/08/2021, 8:38 AM

## 2021-03-09 DIAGNOSIS — R52 Pain, unspecified: Secondary | ICD-10-CM

## 2021-03-09 MED ORDER — OXYCODONE HCL 5 MG PO TABS
2.5000 mg | ORAL_TABLET | Freq: Every day | ORAL | Status: DC
Start: 2021-03-09 — End: 2021-03-10
  Administered 2021-03-09: 2.5 mg via ORAL
  Filled 2021-03-09: qty 1

## 2021-03-09 NOTE — Progress Notes (Signed)
PROGRESS NOTE   Subjective/Complaints: Walked 36 feet yesterday! Discussed scheduling 2.5mg  oxycodone 1:30pm as well to help with afternoon session.  Had a great discussion with Dr. Kieth Brightly this morning.   ROS: +pain in right wrist, +spinal pain with mobility, +diarrhea, +incontinence, +depression and anxiety as per son, +dysuria, insomnia has resolved, denies constipation  Objective:   No results found. No results for input(s): WBC, HGB, HCT, PLT in the last 72 hours. No results for input(s): NA, K, CL, CO2, GLUCOSE, BUN, CREATININE, CALCIUM in the last 72 hours.  Intake/Output Summary (Last 24 hours) at 03/09/2021 1209 Last data filed at 03/09/2021 0843 Gross per 24 hour  Intake 820 ml  Output --  Net 820 ml        Physical Exam: Vital Signs Blood pressure (!) 129/56, pulse 69, temperature 97.6 F (36.4 C), temperature source Oral, resp. rate 20, height 5\' 3"  (1.6 m), weight 57.6 kg, SpO2 100 %. Gen: no distress, normal appearing HEENT: oral mucosa pink and moist, NCAT Cardio: Reg rate Chest: normal effort, normal rate of breathing Abd: soft, non-distended Ext: no edema Psych: pleasant, normal affect Skin: hemovac out; blisters on back incision, bruising over right wrist incisi   Musculoskeletal: . Sensation intact.  Did allow me to test DF only- at least 4/5 B/L- but said hurt too bad to do MS exam. No change   Assessment/Plan: 1. Functional deficits which require 3+ hours per day of interdisciplinary therapy in a comprehensive inpatient rehab setting.  Physiatrist is providing close team supervision and 24 hour management of active medical problems listed below.  Physiatrist and rehab team continue to assess barriers to discharge/monitor patient progress toward functional and medical goals  Care Tool:  Bathing    Body parts bathed by patient: Face   Body parts bathed by helper: Right arm,Left  arm,Chest,Abdomen     Bathing assist Assist Level: 2 Helpers     Upper Body Dressing/Undressing Upper body dressing   What is the patient wearing?: Hospital gown only    Upper body assist Assist Level: Total Assistance - Patient < 25%    Lower Body Dressing/Undressing Lower body dressing      What is the patient wearing?: Skirt     Lower body assist Assist for lower body dressing: Total Assistance - Patient < 25%     Toileting Toileting    Toileting assist Assist for toileting: Maximal Assistance - Patient 25 - 49%     Transfers Chair/bed transfer  Transfers assist  Chair/bed transfer activity did not occur: Safety/medical concerns  Chair/bed transfer assist level: Minimal Assistance - Patient > 75%     Locomotion Ambulation   Ambulation assist   Ambulation activity did not occur: Safety/medical concerns  Assist level: 2 helpers Assistive device: Walker-rolling Max distance: 27   Walk 10 feet activity   Assist  Walk 10 feet activity did not occur: Safety/medical concerns  Assist level: 2 helpers Assistive device: Walker-rolling   Walk 50 feet activity   Assist Walk 50 feet with 2 turns activity did not occur: Safety/medical concerns         Walk 150 feet activity   Assist Walk 150 feet  activity did not occur: Safety/medical concerns         Walk 10 feet on uneven surface  activity   Assist Walk 10 feet on uneven surfaces activity did not occur: Safety/medical concerns         Wheelchair     Assist Will patient use wheelchair at discharge?: Yes Type of Wheelchair: Manual Wheelchair activity did not occur: Safety/medical concerns         Wheelchair 50 feet with 2 turns activity    Assist    Wheelchair 50 feet with 2 turns activity did not occur: Safety/medical concerns       Wheelchair 150 feet activity     Assist  Wheelchair 150 feet activity did not occur: Safety/medical concerns       Blood  pressure (!) 129/56, pulse 69, temperature 97.6 F (36.4 C), temperature source Oral, resp. rate 20, height 5\' 3"  (1.6 m), weight 57.6 kg, SpO2 100 %.  1.  Lumbar burst fracture s/p ORIF T11-L4- might be SCI with neurogenic bowel and bladder notable.              -ELOS/Goals: minA 2-3 weeks  -Continue CIR. Tolerating therapy better 2.  Impaired mobility: -DVT/anticoagulation: Continue Lovenox 40 mg daily since renal function fine             -antiplatelet therapy: N/A 3. Pain Management: Oxycodone and robaxin prn.              --Lidocaine patches x3 added  for pain control             --Tylenol 650mg  TID scheduled.   -added scheduled oxycodone 5mg  daily 7:30am and 1:30pm  Decrease prn oxycodone to 2.5mg  q8H prn given affects on cognition and energy- patient and husband in agreement. Obtained XR right wrist where pain was worst--negative. Apply ice. Pain improved with therapy and at rest.  4. Anxiety and depression: LCSW to follow for evaluation and support. Discussed with son her premorbid issues. Discussed starting Remeron for her anxiety and depression and son and husband agreeable. Start 7.5mg  4/20. Well tolerated, conitnue             -antipsychotic agents: N/A 5. Neuropsych: This patient may be intermittently capable of making decisions on her own behalf. 6. Skin/Wound Care: Routine pressure relief measures. May dc IV 7. Fluids/Electrolytes/Nutrition: Monitor I/O. Monitor lytes weekly. Eating much better with Remeron! 8. L2 burst fracture: Back precautions with brace when at EOB/OOB 9. Sternal fracture: Encourage IS.  Continue lidocaine patch.  10. Neurogenic bladder: voiding well, incontinence, placed order for timed voiding q2H while awake.  11. Neurogenic bowel: Was incontinent during the night ( Movantik, miralax and senna taken this am).              --On colace, miralax, senna, movantik and daily suppository but laxative/suppositories have been refused/held multiple days.                --Will order KUB to monitor for stool burden as family reports poor intake.              --will start with Senna S two in am and suppository after supper for bowel program.  4/12- cannot assess if pt is SCI pt- but is at high risk- will determine when can check strength exam.   4/13- pt unable to push stool out- will continue suppository nightly- don't  Hold it, even if has BMs/incontinent. Add Lomotil if having too much diarrhea. Prn.  12.  Acute blood loss anemia: H/H down to 9.2/94.9-->recheck in am.  13. Hyperglycemia: Likely due to stress--will order A1C for am.  14. Hypocalcemia: Ionized calcium 1.11.             --will add calcium supplement.  15. Hypertension: systolic BP mildly elevated, monitor TID.  4/14- BP 120s/70s- con't regimen 15. Baseline: patient used to walk 1-2 miles per day with her husband. Was very active around her home.   16. Hypokalemia  4/12- K+ 3.0- repleted and will recheck next 2 days.  4/13- K+ 3.8 today- will recheck Friday  17. Confusion: improved with decreasing oxycodone.  18. Diarrhea: d/c dulcolax suppository.  19. Dysuria:  UA weakly positive but UC with insignificant growth- repeat today   LOS: 11 days A FACE TO FACE EVALUATION WAS PERFORMED  Clint Bolder P Pierce Barocio 03/09/2021, 12:09 PM

## 2021-03-09 NOTE — Progress Notes (Signed)
Occupational Therapy Session Note  Patient Details  Name: Shelby Arellano MRN: [id number] Date of Birth: June 11, 1935  Today's Date: 03/09/2021 OT Individual Time: [phone] and 1194-1740 OT Individual Time Calculation (min): 44 min and 41 min   Short Term Goals: Week 2:  OT Short Term Goal 1 (Week 2): Pt will perform stand pivot transfer Min A of one caregiver OT Short Term Goal 2 (Week 2): Pt will consistently perform sit <> stands Min A to decrease caregiver burden OT Short Term Goal 3 (Week 2): Pt will thread LEs for LB dress with reacher no more than Mod A OT Short Term Goal 4 (Week 2): Pt will perform UB ADLs with no more than Min A seated EOB or sink    Skilled Therapeutic Interventions/Progress Updates:    Pt greeted in bed and premedicated for pain, just finished speaking with MD about timed toileting. Pt agreeable to start session by using the restroom. Mod A for supine<sit via logroll technique. Discussed with spouse that they can independently purchase a bedrail to go under the mattress at home if pt still needs it for supine<sit at time of d/c. While EOB pt able to maintain her sitting balance while OT donned the TLSO. Pt also used B UEs to go through some clothes brought in from home without balance assist. Mod A for stand pivot<[name] and also for stand pivot<elevated toilet without AD. OT did not use AD during transfers to promote better upright alignment of trunk. Pt needed assistance for clothing mgt x2 and also posterior perihygiene to ensure cleanliness (pt with only +bladder void). Lowered the drop arm of the BSC component and pt able to lean laterally for OT to assist. Note that we propped her feet up while voiding as well. After returning to the w/c, she completed hand washing and oral care while sitting at the sink. Limited ability to lean forward to reach for soap dispenser and to spit into the sink. With increased effort, pt able to manage the faucet lever on her Lt side. She wanted  to return to bed vs sit up for her next therapy. Mod A for stand pivot<bed and Min A to elevate LEs for returning to bed. Pt remained in bed at close of session, all needs within reach and bed alarm set.   2nd Session 1:1 tx (41 min) Pt greeted in bed, smiling at sight of therapist. Her spouse was present, reporting that pt had been premedicated for pain. Mod A for supine<sit using logroll technique and therapist donned TLSO while pt was EOB. Mod A for stand pivot<[name] without AD. OT then washed her hair using the hair washing tray which appeared to brighten affect. For remainder of session worked on UB strengthening/endurance/OOB tolerance while pt combed, blow-dried, and styled hair with hair pieces. She was agreeable to remain sitting up for PT session. Left her with all needs within reach, reclined for comfort.   Therapy Documentation Precautions:  Precautions Precautions: Back,Sternal,Fall Precaution Comments: brace when OOB Required Braces or Orthoses: Spinal Brace Spinal Brace: Thoracolumbosacral orthotic,Applied in sitting position Restrictions Weight Bearing Restrictions: No ADL: ADL Grooming: Minimal assistance Where Assessed-Grooming: Edge of bed Upper Body Bathing: Dependent Where Assessed-Upper Body Bathing: Edge of bed Lower Body Bathing: Dependent (+2) Where Assessed-Lower Body Bathing: Edge of bed Upper Body Dressing: Dependent Where Assessed-Upper Body Dressing: Edge of bed      Therapy/Group: Individual Therapy  Danyele Smejkal A Aureliano Oshields 03/09/2021, 12:44 PM

## 2021-03-09 NOTE — Progress Notes (Signed)
Physical Therapy Session Note  Patient Details  Name: Shelby Arellano MRN: [id number] Date of Birth: 1935/09/17  Today's Date: 03/09/2021 PT Individual Time: 1030-1100 PT Individual Time Calculation (min): 30 min   Short Term Goals: Week 2:  PT Short Term Goal 1 (Week 2): pt to demonstrate min A supine<>sit PT Short Term Goal 2 (Week 2): pt to demonstrate functional transfers with LRAD min A consistently PT Short Term Goal 3 (Week 2): pt to demonstrate 50' min A gait with LRAD PT Short Term Goal 4 (Week 2): pt to tolerate standing at min A for at least 5 mins  Skilled Therapeutic Interventions/Progress Updates:    Pt greeted supine in bed. Husband at bedside. Pt agreeable to therapy. She reports well controlled pain and appears pleased with her recent pain medication modification. Supine<>sit completed with minA via log rolling. Forward scooting with minA to EOB. Donned TLSO with totalA for time management while seated EOB. Stand<>pivot with minA to [name] w/c with cues for general sequencing and technique. W/c transport to main rehab gym for time management.  Gait training 77ft (!!) with very LIGHT CGA - cues needed for postural awareness, keeping body within walker frame, and increased B step length/clearance. Step-to gait pattern and very decreased gait speed but no knee buckling or LOB.   Completed repeated sit<>stands (x3 reps) with CGA - progressing to supervision (!!) from [name] w/c to RW. Cues needed for hand placement to arm rests during transition.  Returned to her room in [name] w/c and pt agreeable to remain seated in [name] w/c. Husband at bedside and updated on pt's progress and mobility. Safety belt alarm on, needs within reach at end of session.   Therapy Documentation Precautions:  Precautions Precautions: Back,Sternal,Fall Precaution Comments: brace when OOB Required Braces or Orthoses: Spinal Brace Spinal Brace: Thoracolumbosacral orthotic,Applied in sitting  position Restrictions Weight Bearing Restrictions: No General:    Therapy/Group: Individual Therapy  Bryndan Bilyk P Enrique Weiss PT  03/09/2021, 12:13 PM
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
## 2021-03-09 NOTE — Progress Notes (Signed)
Physical Therapy Session Note  Patient Details  Name: Shelby Arellano MRN: [id number] Date of Birth: Feb 05, 1935  Today's Date: 03/09/2021 PT Individual Time: 7517-0017 PT Individual Time Calculation (min): 42 min   Short Term Goals: Week 2:  PT Short Term Goal 1 (Week 2): pt to demonstrate min A supine<>sit PT Short Term Goal 2 (Week 2): pt to demonstrate functional transfers with LRAD min A consistently PT Short Term Goal 3 (Week 2): pt to demonstrate 50' min A gait with LRAD PT Short Term Goal 4 (Week 2): pt to tolerate standing at min A for at least 5 mins  Skilled Therapeutic Interventions/Progress Updates: Pt presents sitting in [name] w/c, tilted back and LEs elevated, c/o increased pain in back.  Pt transferred sit to stand w/ min to CGA and verbal cues.  Pt required increased time to perform 2/2 c/o pain.  Pt amb in room slowly w/ RW and flexed posture, shuffling gait to return to bed.  Pt required 1 sitting rest break 2/2 weak UEs 2/2 posture.  Pt performed log roll technique sitting to R sidelying w/ mod A especially for LEs 2/2 increased pain.  Pt rolls w/ min A maintaining hooklying position.  PT searched for cushion for improved tolerance for w/c left in room, but unable to trial.  Pt remained in bed w/ bed alarm on and all needs in reach, spouse present.     Therapy Documentation Precautions:  Precautions Precautions: Back,Sternal,Fall Precaution Comments: brace when OOB Required Braces or Orthoses: Spinal Brace Spinal Brace: Thoracolumbosacral orthotic,Applied in sitting position Restrictions Weight Bearing Restrictions: No General:   Vital Signs:  Pain:10/10 back and "tailbone"         Therapy/Group: Individual Therapy  Lucio Edward 03/09/2021, 3:39 PM

## 2021-03-10 DIAGNOSIS — K592 Neurogenic bowel, not elsewhere classified: Secondary | ICD-10-CM

## 2021-03-10 DIAGNOSIS — G8911 Acute pain due to trauma: Secondary | ICD-10-CM

## 2021-03-10 MED ORDER — OXYCODONE HCL 5 MG PO TABS
2.5000 mg | ORAL_TABLET | Freq: Two times a day (BID) | ORAL | Status: DC
  Administered 2021-03-10 – 2021-03-21 (×22): 2.5 mg via ORAL
  Filled 2021-03-10 (×21): qty 1

## 2021-03-10 MED ORDER — METHOCARBAMOL 500 MG PO TABS
250.0000 mg | ORAL_TABLET | Freq: Three times a day (TID) | ORAL | Status: DC | PRN
  Administered 2021-03-12 (×2): 250 mg via ORAL
  Filled 2021-03-10 (×2): qty 1

## 2021-03-10 NOTE — Progress Notes (Signed)
PROGRESS NOTE   Subjective/Complaints: Pain control better. Was under the impression that pain medication was scheduled at night. Didn't realize she had to ask for it and got behind.   ROS: Patient denies fever, rash, sore throat, blurred vision, nausea, vomiting, diarrhea, cough, shortness of breath or chest pain, joint or back pain, headache, or mood change.    Objective:   No results found. No results for input(s): WBC, HGB, HCT, PLT in the last 72 hours. No results for input(s): NA, K, CL, CO2, GLUCOSE, BUN, CREATININE, CALCIUM in the last 72 hours.  Intake/Output Summary (Last 24 hours) at 03/10/2021 0927 Last data filed at 03/10/2021 0759 Gross per 24 hour  Intake 600 ml  Output --  Net 600 ml        Physical Exam: Vital Signs Blood pressure (!) 118/51, pulse 69, temperature 97.9 F (36.6 C), resp. rate 18, height 5\' 3"  (1.6 m), weight 57.6 kg, SpO2 100 %. Constitutional: No distress . Vital signs reviewed. HEENT: EOMI, oral membranes moist Neck: supple Cardiovascular: RRR without murmur. No JVD    Respiratory/Chest: CTA Bilaterally without wheezes or rales. Normal effort    GI/Abdomen: BS +, non-tender, non-distended Ext: no clubbing, cyanosis, or edema Psych: pleasant and cooperative Skin: hemovac out; blisters on back incision, bruising over right wrist incisi   Musculoskeletal: . Sensation intact.  MMT 3-4/5 in LE, pain inhibition   Assessment/Plan: 1. Functional deficits which require 3+ hours per day of interdisciplinary therapy in a comprehensive inpatient rehab setting.  Physiatrist is providing close team supervision and 24 hour management of active medical problems listed below.  Physiatrist and rehab team continue to assess barriers to discharge/monitor patient progress toward functional and medical goals  Care Tool:  Bathing    Body parts bathed by patient: Face   Body parts bathed by  helper: Right arm,Left arm,Chest,Abdomen     Bathing assist Assist Level: 2 Helpers     Upper Body Dressing/Undressing Upper body dressing   What is the patient wearing?: Hospital gown only    Upper body assist Assist Level: Total Assistance - Patient < 25%    Lower Body Dressing/Undressing Lower body dressing      What is the patient wearing?: Skirt     Lower body assist Assist for lower body dressing: Total Assistance - Patient < 25%     Toileting Toileting    Toileting assist Assist for toileting: Maximal Assistance - Patient 25 - 49%     Transfers Chair/bed transfer  Transfers assist  Chair/bed transfer activity did not occur: Safety/medical concerns  Chair/bed transfer assist level: Minimal Assistance - Patient > 75%     Locomotion Ambulation   Ambulation assist   Ambulation activity did not occur: Safety/medical concerns  Assist level: Contact Guard/Touching assist Assistive device: Walker-rolling Max distance: 15   Walk 10 feet activity   Assist  Walk 10 feet activity did not occur: Safety/medical concerns  Assist level: Contact Guard/Touching assist Assistive device: Walker-rolling   Walk 50 feet activity   Assist Walk 50 feet with 2 turns activity did not occur: Safety/medical concerns  Assist level: Contact Guard/Touching assist Assistive device: Walker-rolling  Walk 150 feet activity   Assist Walk 150 feet activity did not occur: Safety/medical concerns (fatigue)         Walk 10 feet on uneven surface  activity   Assist Walk 10 feet on uneven surfaces activity did not occur: Safety/medical concerns         Wheelchair     Assist Will patient use wheelchair at discharge?: Yes Type of Wheelchair: Manual Wheelchair activity did not occur: Safety/medical concerns         Wheelchair 50 feet with 2 turns activity    Assist    Wheelchair 50 feet with 2 turns activity did not occur: Safety/medical  concerns       Wheelchair 150 feet activity     Assist  Wheelchair 150 feet activity did not occur: Safety/medical concerns       Blood pressure (!) 118/51, pulse 69, temperature 97.9 F (36.6 C), resp. rate 18, height 5\' 3"  (1.6 m), weight 57.6 kg, SpO2 100 %.  1.  Lumbar burst fracture s/p ORIF T11-L4- might be SCI with neurogenic bowel and bladder notable.              -ELOS/Goals: minA 2-3 weeks  -Continue CIR. Tolerating therapy better 2.  Impaired mobility: -DVT/anticoagulation: Continue Lovenox 40 mg daily since renal function fine             -antiplatelet therapy: N/A 3. Pain Management: Oxycodone and robaxin prn.              --Lidocaine patches x3 added  for pain control             --Tylenol 650mg  TID scheduled.   -added scheduled oxycodone 5mg  daily 7:30am and 1:30pm   4/23 will add 2.5mg  scheduled at night as well. Pt agrees   -also introduce prn robaxin for spasms    prn oxycodone has been decreased to 2.5mg  q8H prn given affects on cognition and energy- patient and husband in agreement.   -Obtained XR right wrist where pain was worst--negative. Apply ice. Pain improved with therapy and at rest.  4. Anxiety and depression: LCSW to follow for evaluation and support. Discussed with son her premorbid issues. Discussed starting Remeron for her anxiety and depression and son and husband agreeable. Start 7.5mg  4/20. Well tolerated, conitnue             -antipsychotic agents: N/A 5. Neuropsych: This patient may be intermittently capable of making decisions on her own behalf. 6. Skin/Wound Care: Routine pressure relief measures. May dc IV 7. Fluids/Electrolytes/Nutrition: Monitor I/O. Monitor lytes weekly. Eating much better with Remeron! 8. L2 burst fracture: Back precautions with brace when at EOB/OOB 9. Sternal fracture: Encourage IS.  Continue lidocaine patch.  10. Neurogenic bladder: voiding well, incontinence, placed order for timed voiding q2H while awake.  11.  Neurogenic bowel: Was incontinent during the night ( Movantik, miralax and senna taken this am).              --On colace, miralax, senna, movantik and daily suppository but laxative/suppositories have been refused/held multiple days.               --Will order KUB to monitor for stool burden as family reports poor intake.              --will start with Senna S two in am and suppository after supper for bowel program.  4/12- cannot assess if pt is SCI pt- but is at high risk- will determine when  can check strength exam.   4/23 moving bowels, formed stools.  12. Acute blood loss anemia: H/H down to 9.2/94.9-->recheck in am.  13. Hyperglycemia: Likely due to stress--will order A1C for am.  14. Hypocalcemia: Ionized calcium 1.11.             --added calcium supplement.  15. Hypertension: systolic BP mildly elevated, monitor TID.  4/14- BP 120s/70s- con't regimen 15. Baseline: patient used to walk 1-2 miles per day with her husband. Was very active around her home.   16. Hypokalemia  4/12- K+ 3.0- repleted and will recheck next 2 days.  4/13- K+ 3.8 today- will recheck Friday  17. Confusion: improved with decreasing oxycodone.  18. Diarrhea: d/c dulcolax suppository.  19. Dysuria:  F/u ucx negative. Sx improved?   LOS: 12 days A FACE TO FACE EVALUATION WAS PERFORMED  Ranelle Oyster 03/10/2021, 9:27 AM

## 2021-03-11 NOTE — Progress Notes (Signed)
Occupational Therapy Session Note  Patient Details  Name: Shelby Arellano MRN: [id number] Date of Birth: 08-09-1935  Today's Date: 03/11/2021 OT Individual Time: 0856-1000 and 1950-9326 OT Individual Time Calculation (min): 64 min and 26 min   Short Term Goals: Week 2:  OT Short Term Goal 1 (Week 2): Pt will perform stand pivot transfer Min A of one caregiver OT Short Term Goal 2 (Week 2): Pt will consistently perform sit <> stands Min A to decrease caregiver burden OT Short Term Goal 3 (Week 2): Pt will thread LEs for LB dress with reacher no more than Mod A OT Short Term Goal 4 (Week 2): Pt will perform UB ADLs with no more than Min A seated EOB or sink  Skilled Therapeutic Interventions/Progress Updates:    Pt greeted in bed and premedicated for pain. Agreeable to shower today. Mod A for supine<sit and for stand pivot<w/c<TTB (after TLSO was donned). Back incision covered. Pt then bathed while seated, utilizing lateral leans for perihygiene and LH sponge to reach her feet. UB/LB dressing completed sit<stand from TTB using the grab bar, pt wearing TLSO before stands. Mod A for stand pivot<w/c and then she combed and styled hair while sitting at the sink. Pt returned to bed with Mod A stand pivot. Left her comfortably in bed with all needs within reach and bed alarm set. Tx focus placed on back precaution adherence, adaptive self care skills, functional transfers, and OOB tolerance.   2nd Session 1:1 tx (26 min) Pt greeted in bed, pain manageable for tx. She requested to use the restroom, agreeable to ambulate with encouragement. Min A for supine<sit with pt gently pulling up onto OT's arm once she was in sidelying position. Once TLSO was donned, Mod A for sit<stand and Min A for ambulatory transfer to toilet using RW! Increased time required due to pts very slow walking pace. Max A for toileting tasks with pt having B+B void. Mod A for sit<stands. She then ambulated back to bed and returned to bed  with Min A to elevate LEs. Pt remained in bed at close of session, all needs within reach and bed alarm set. Tx focus placed on dynamic balance, functional transfers, and ADL retraining.   Therapy Documentation Precautions:  Precautions Precautions: Back,Sternal,Fall Precaution Comments: brace when OOB Required Braces or Orthoses: Spinal Brace Spinal Brace: Thoracolumbosacral orthotic,Applied in sitting position Restrictions Weight Bearing Restrictions: No Vital Signs: Therapy Vitals Temp: 98.4 F (36.9 C) Temp Source: Oral Pulse Rate: 83 Resp: 16 BP: 131/60 Patient Position (if appropriate): Lying Oxygen Therapy SpO2: 100 % O2 Device: Room Air Pain: Pain Assessment Pain Scale: 0-10 Pain Score: 0-No pain ADL: ADL Grooming: Minimal assistance Where Assessed-Grooming: Edge of bed Upper Body Bathing: Dependent Where Assessed-Upper Body Bathing: Edge of bed Lower Body Bathing: Dependent (+2) Where Assessed-Lower Body Bathing: Edge of bed Upper Body Dressing: Dependent Where Assessed-Upper Body Dressing: Edge of bed :     Therapy/Group: Individual Therapy  Katelyn Kohlmeyer A Clarabelle Oscarson 03/11/2021, 3:45 PM

## 2021-03-11 NOTE — Progress Notes (Signed)
PROGRESS NOTE   Subjective/Complaints: Had a much better night. Slept until 0600 this morning. No new issues  ROS: Patient denies fever, rash, sore throat, blurred vision, nausea, vomiting, diarrhea, cough, shortness of breath or chest pain,   headache, or mood change.    Objective:   No results found. No results for input(s): WBC, HGB, HCT, PLT in the last 72 hours. No results for input(s): NA, K, CL, CO2, GLUCOSE, BUN, CREATININE, CALCIUM in the last 72 hours.  Intake/Output Summary (Last 24 hours) at 03/11/2021 0802 Last data filed at 03/11/2021 0741 Gross per 24 hour  Intake 460 ml  Output --  Net 460 ml        Physical Exam: Vital Signs Blood pressure 114/62, pulse 72, temperature 98.1 F (36.7 C), resp. rate 16, height 5\' 3"  (1.6 m), weight 57.6 kg, SpO2 98 %. Constitutional: No distress . Vital signs reviewed. HEENT: EOMI, oral membranes moist Neck: supple Cardiovascular: RRR without murmur. No JVD    Respiratory/Chest: CTA Bilaterally without wheezes or rales. Normal effort    GI/Abdomen: BS +, non-tender, non-distended Ext: no clubbing, cyanosis, or edema Psych: pleasant and cooperative Skin: back incision dressed. Not visualized today Musculoskeletal: . Sensation intact.  MMT 3-4/5 in LE, pain inhibition   Assessment/Plan: 1. Functional deficits which require 3+ hours per day of interdisciplinary therapy in a comprehensive inpatient rehab setting.  Physiatrist is providing close team supervision and 24 hour management of active medical problems listed below.  Physiatrist and rehab team continue to assess barriers to discharge/monitor patient progress toward functional and medical goals  Care Tool:  Bathing    Body parts bathed by patient: Face   Body parts bathed by helper: Right arm,Left arm,Chest,Abdomen     Bathing assist Assist Level: 2 Helpers     Upper Body Dressing/Undressing Upper  body dressing   What is the patient wearing?: Hospital gown only    Upper body assist Assist Level: Total Assistance - Patient < 25%    Lower Body Dressing/Undressing Lower body dressing      What is the patient wearing?: Skirt     Lower body assist Assist for lower body dressing: Total Assistance - Patient < 25%     Toileting Toileting    Toileting assist Assist for toileting: Maximal Assistance - Patient 25 - 49%     Transfers Chair/bed transfer  Transfers assist  Chair/bed transfer activity did not occur: Safety/medical concerns  Chair/bed transfer assist level: Minimal Assistance - Patient > 75%     Locomotion Ambulation   Ambulation assist   Ambulation activity did not occur: Safety/medical concerns  Assist level: Contact Guard/Touching assist Assistive device: Walker-rolling Max distance: 15   Walk 10 feet activity   Assist  Walk 10 feet activity did not occur: Safety/medical concerns  Assist level: Contact Guard/Touching assist Assistive device: Walker-rolling   Walk 50 feet activity   Assist Walk 50 feet with 2 turns activity did not occur: Safety/medical concerns  Assist level: Contact Guard/Touching assist Assistive device: Walker-rolling    Walk 150 feet activity   Assist Walk 150 feet activity did not occur: Safety/medical concerns (fatigue)  Walk 10 feet on uneven surface  activity   Assist Walk 10 feet on uneven surfaces activity did not occur: Safety/medical concerns         Wheelchair     Assist Will patient use wheelchair at discharge?: Yes Type of Wheelchair: Manual Wheelchair activity did not occur: Safety/medical concerns         Wheelchair 50 feet with 2 turns activity    Assist    Wheelchair 50 feet with 2 turns activity did not occur: Safety/medical concerns       Wheelchair 150 feet activity     Assist  Wheelchair 150 feet activity did not occur: Safety/medical concerns        Blood pressure 114/62, pulse 72, temperature 98.1 F (36.7 C), resp. rate 16, height 5\' 3"  (1.6 m), weight 57.6 kg, SpO2 98 %.  1.  Lumbar burst fracture s/p ORIF T11-L4- might be SCI with neurogenic bowel and bladder notable.              -ELOS/Goals: minA 2-3 weeks  -Continue CIR. Tolerating therapy better 2.  Impaired mobility: -DVT/anticoagulation: Continue Lovenox 40 mg daily since renal function fine             -antiplatelet therapy: N/A 3. Pain Management: Oxycodone and robaxin prn.              --Lidocaine patches x3 added  for pain control             --Tylenol 650mg  TID scheduled.   -added scheduled oxycodone 5mg  daily 7:30am and 1:30pm   4/23 added 2.5mg  scheduled at night as well.     4/24 pain control much improved     Continue prn oxycodone at 2.5mg  q8H prn to limit neurosedation   -Obtained XR right wrist where pain was worst--negative.    - Apply ice. Pain improved with therapy and at rest.  4. Anxiety and depression: LCSW to follow for evaluation and support. Discussed with son her premorbid issues. Discussed starting Remeron for her anxiety and depression and son and husband agreeable. Start 7.5mg  4/20. Well tolerated, conitnue             -antipsychotic agents: N/A 5. Neuropsych: This patient may be intermittently capable of making decisions on her own behalf. 6. Skin/Wound Care: Routine pressure relief measures. May dc IV 7. Fluids/Electrolytes/Nutrition: Monitor I/O. Monitor lytes weekly. Eating much better with Remeron! 8. L2 burst fracture: Back precautions with brace when at EOB/OOB 9. Sternal fracture: Encourage IS.  Continue lidocaine patch.  10. Neurogenic bladder: voiding well, incontinence, placed order for timed voiding q2H while awake.  11. Neurogenic bowel: Was incontinent during the night ( Movantik, miralax and senna taken this am).              --On colace, miralax, senna, movantik and daily suppository but laxative/suppositories have been  refused/held multiple days.               --Will order KUB to monitor for stool burden as family reports poor intake.              --will start with Senna S two in am and suppository after supper for bowel program.  4/12- cannot assess if pt is SCI pt- but is at high risk- will determine when can check strength exam.   4/23 moving bowels, formed stools.  12. Acute blood loss anemia: H/H down to 9.2/94.9-->recheck in am.  13. Hyperglycemia: Likely due to stress--will order A1C for  am.  14. Hypocalcemia: Ionized calcium 1.11.             --added calcium supplement.  15. Hypertension: systolic BP mildly elevated, monitor TID.  4/14- BP 120s/70s- con't regimen 15. Baseline: patient used to walk 1-2 miles per day with her husband. Was very active around her home.   16. Hypokalemia  4/12- K+ 3.0- repleted and will recheck next 2 days.  4/13- K+ 3.8 today- will recheck Friday  17. Confusion: improved with decreasing oxycodone.  18. Diarrhea: d/c dulcolax suppository.  19. Dysuria:  F/u ucx negative. Sx improved   LOS: 13 days A FACE TO FACE EVALUATION WAS PERFORMED  Ranelle Oyster 03/11/2021, 8:02 AM

## 2021-03-12 LAB — CBC
HCT: 31.5 % — ABNORMAL LOW (ref 36.0–46.0)
Hemoglobin: 10.1 g/dL — ABNORMAL LOW (ref 12.0–15.0)
MCH: 32 pg (ref 26.0–34.0)
MCHC: 32.1 g/dL (ref 30.0–36.0)
MCV: 99.7 fL (ref 80.0–100.0)
Platelets: 375 10*3/uL (ref 150–400)
RBC: 3.16 MIL/uL — ABNORMAL LOW (ref 3.87–5.11)
RDW: 15.9 % — ABNORMAL HIGH (ref 11.5–15.5)
WBC: 6.7 10*3/uL (ref 4.0–10.5)
nRBC: 0 % (ref 0.0–0.2)

## 2021-03-12 MED ORDER — OXYCODONE HCL 5 MG PO TABS
5.0000 mg | ORAL_TABLET | Freq: Every day | ORAL | Status: DC
  Administered 2021-03-13 – 2021-03-21 (×10): 5 mg via ORAL
  Filled 2021-03-12 (×9): qty 1

## 2021-03-12 NOTE — Progress Notes (Signed)
PROGRESS NOTE   Subjective/Complaints:  Pt reports LBM this AM- still having awful pain- hates taking meds, because they confuse her, but pain just not controlled.  Itching with dressing- occ burning with urine. Was treated for UTI  ROS:  Pt denies SOB, abd pain, CP, N/V/C/D, and vision changes   Objective:   No results found. Recent Labs    03/12/21 0459  WBC 6.7  HGB 10.1*  HCT 31.5*  PLT 375   No results for input(s): NA, K, CL, CO2, GLUCOSE, BUN, CREATININE, CALCIUM in the last 72 hours.  Intake/Output Summary (Last 24 hours) at 03/12/2021 1847 Last data filed at 03/12/2021 1253 Gross per 24 hour  Intake 360 ml  Output --  Net 360 ml        Physical Exam: Vital Signs Blood pressure (!) 135/55, pulse 79, temperature 97.7 F (36.5 C), resp. rate 16, height 5\' 3"  (1.6 m), weight 57.6 kg, SpO2 95 %.   General: awake, alert, appropriate, sitting up in bed- much more with it and interactive;NAD HENT: conjugate gaze; oropharynx moist CV: regular rate; no JVD Pulmonary: CTA B/L; no W/R/R- good air movement GI: soft, NT, ND, (+)BS Psychiatric: appropriate- not crying like she was prior to my vacation Neurological: alert- much more alert  Skin: back incision dressed. Not visualized today Musculoskeletal: . Sensation intact.  MMT 3-4/5 in LE, pain inhibition   Assessment/Plan: 1. Functional deficits which require 3+ hours per day of interdisciplinary therapy in a comprehensive inpatient rehab setting.  Physiatrist is providing close team supervision and 24 hour management of active medical problems listed below.  Physiatrist and rehab team continue to assess barriers to discharge/monitor patient progress toward functional and medical goals  Care Tool:  Bathing    Body parts bathed by patient: Right arm,Left arm,Chest,Abdomen,Front perineal area,Right upper leg,Left upper leg,Right lower leg,Left lower  leg,Face   Body parts bathed by helper: Buttocks     Bathing assist Assist Level: Minimal Assistance - Patient > 75%     Upper Body Dressing/Undressing Upper body dressing   What is the patient wearing?: Pull over shirt    Upper body assist Assist Level: Set up assist    Lower Body Dressing/Undressing Lower body dressing      What is the patient wearing?: Incontinence brief     Lower body assist Assist for lower body dressing: Maximal Assistance - Patient 25 - 49%     Toileting Toileting    Toileting assist Assist for toileting: Moderate Assistance - Patient 50 - 74%     Transfers Chair/bed transfer  Transfers assist  Chair/bed transfer activity did not occur: Safety/medical concerns  Chair/bed transfer assist level: Minimal Assistance - Patient > 75%     Locomotion Ambulation   Ambulation assist   Ambulation activity did not occur: Safety/medical concerns  Assist level: Contact Guard/Touching assist Assistive device: Walker-rolling Max distance: 15   Walk 10 feet activity   Assist  Walk 10 feet activity did not occur: Safety/medical concerns  Assist level: Contact Guard/Touching assist Assistive device: Walker-rolling   Walk 50 feet activity   Assist Walk 50 feet with 2 turns activity did not occur: Safety/medical concerns  Assist level: Contact Guard/Touching assist Assistive device: Walker-rolling    Walk 150 feet activity   Assist Walk 150 feet activity did not occur: Safety/medical concerns (fatigue)         Walk 10 feet on uneven surface  activity   Assist Walk 10 feet on uneven surfaces activity did not occur: Safety/medical concerns         Wheelchair     Assist Will patient use wheelchair at discharge?: Yes Type of Wheelchair: Manual Wheelchair activity did not occur: Safety/medical concerns         Wheelchair 50 feet with 2 turns activity    Assist    Wheelchair 50 feet with 2 turns activity did not  occur: Safety/medical concerns       Wheelchair 150 feet activity     Assist  Wheelchair 150 feet activity did not occur: Safety/medical concerns       Blood pressure (!) 135/55, pulse 79, temperature 97.7 F (36.5 C), resp. rate 16, height 5\' 3"  (1.6 m), weight 57.6 kg, SpO2 95 %.  1.  Lumbar burst fracture s/p ORIF T11-L4- might be SCI with neurogenic bowel and bladder notable.              -ELOS/Goals: minA 2-3 weeks  -con't CIR- PT and OT 2.  Impaired mobility: -DVT/anticoagulation: Continue Lovenox 40 mg daily since renal function fine             -antiplatelet therapy: N/A 3. Pain Management: Oxycodone and robaxin prn.              --Lidocaine patches x3 added  for pain control             --Tylenol 650mg  TID scheduled.   -added scheduled oxycodone 5mg  daily 7:30am and 1:30pm   4/23 added 2.5mg  scheduled at night as well.     4/24 pain control much improved   4/25- says pain is still an issue- will move 7:30 am pain meds to 6:30 am     Continue prn oxycodone at 2.5mg  q8H prn to limit neurosedation   -Obtained XR right wrist where pain was worst--negative.    - Apply ice. Pain improved with therapy and at rest.  4. Anxiety and depression: LCSW to follow for evaluation and support. Discussed with son her premorbid issues. Discussed starting Remeron for her anxiety and depression and son and husband agreeable. Start 7.5mg  4/20. Well tolerated, conitnue             -antipsychotic agents: N/A 5. Neuropsych: This patient may be intermittently capable of making decisions on her own behalf. 6. Skin/Wound Care: Routine pressure relief measures. May dc IV 7. Fluids/Electrolytes/Nutrition: Monitor I/O. Monitor lytes weekly. Eating much better with Remeron! 8. L2 burst fracture: Back precautions with brace when at EOB/OOB 9. Sternal fracture: Encourage IS.  Continue lidocaine patch.  10. Neurogenic bladder: voiding well, incontinence, placed order for timed voiding q2H while  awake.  11. Neurogenic bowel: Was incontinent during the night ( Movantik, miralax and senna taken this am).              --On colace, miralax, senna, movantik and daily suppository but laxative/suppositories have been refused/held multiple days.               --Will order KUB to monitor for stool burden as family reports poor intake.              --will start with Senna S two in am and suppository  after supper for bowel program.  4/12- cannot assess if pt is SCI pt- but is at high risk- will determine when can check strength exam.   4/23 moving bowels, formed stools.  4/25- less likely to have SCI since having regular stools without bowel program.   12. Acute blood loss anemia: H/H down to 9.2/94.9-->recheck in am.  13. Hyperglycemia: Likely due to stress--will order A1C for am.  14. Hypocalcemia: Ionized calcium 1.11.             --added calcium supplement.  15. Hypertension: systolic BP mildly elevated, monitor TID.  4/14- BP 120s/70s- con't regimen 15. Baseline: patient used to walk 1-2 miles per day with her husband. Was very active around her home.   16. Hypokalemia  4/12- K+ 3.0- repleted and will recheck next 2 days.  4/13- K+ 3.8 today- will recheck Friday  17. Confusion: improved with decreasing oxycodone.  18. Diarrhea: d/c dulcolax suppository.  19. Dysuria:  F/u ucx negative. Sx improved  4/25- pt reports still having burning intermittently- but last check showed (+) U/A but no growth- if continues, will resend   LOS: 14 days A FACE TO FACE EVALUATION WAS PERFORMED  Muela Smeal 03/12/2021, 6:47 PM

## 2021-03-12 NOTE — Progress Notes (Signed)
Occupational Therapy Session Note  Patient Details  Name: Shelby Arellano MRN: [id number] Date of Birth: July 19, 1935  Today's Date: 03/12/2021 OT Individual Time: 9244-6286 and 1300-1328 OT Individual Time Calculation (min): 40 min and 28 min   Short Term Goals: Week 2:  OT Short Term Goal 1 (Week 2): Pt will perform stand pivot transfer Min A of one caregiver OT Short Term Goal 2 (Week 2): Pt will consistently perform sit <> stands Min A to decrease caregiver burden OT Short Term Goal 3 (Week 2): Pt will thread LEs for LB dress with reacher no more than Mod A OT Short Term Goal 4 (Week 2): Pt will perform UB ADLs with no more than Min A seated EOB or sink   Skilled Therapeutic Interventions/Progress Updates:    Session 1: Pt greeted at time of session sitting up in [place] chair with family members present husband and granddaughter. Pain throughout with mobility, no # but repositioned PRN and rest breaks with improvement. Brace noted to be in poor position, readjusted and lowered to appropriate height with pt/family ed. Needing to toilet, walked wheelchair <> bathroom CGA with Min for sit > stand with RW and transferred to toilet same manner. Mod A clothing management, able to perform front hygiene CGA within precautions but needing help with clothing mostly. Once back in wheelchair, pt and family requesting adjustment to leg rests, adjusted foot plates for comfort as pt said she could not tolerate them before. Discussed potentially using manual wheelchair, to further discuss with PT as pt is able to tolerate more upright sitting. Pt left in room call bell in reach slightly reclined all needs met.  Session 2: Pt greeted at time of session supine in bed with brace on, granddaughter present and remained throughout session. Pt wanting to remain in bed as only 30 minute session and "just got back to bed." Focus on long sitting with Mod A to doff brace and returning to supine, minimal pain reported. Rolling  R for therapist to rub lotion on back d/t reports of irritation and work on log rolling, CGA/Min A. Reviewed back precautions able to recall 0/3 prior to education. Focus on BUE ROM with 1# dowel bed level for chest press, midline raise, and FWD circle for 1x10. Alarmon call bell in reach resting prior to next session.     Therapy Documentation Precautions:  Precautions Precautions: Back,Sternal,Fall Precaution Comments: brace when OOB Required Braces or Orthoses: Spinal Brace Spinal Brace: Thoracolumbosacral orthotic,Applied in sitting position Restrictions Weight Bearing Restrictions: No     Therapy/Group: Individual Therapy  Viona Gilmore 03/12/2021, 7:21 AM

## 2021-03-12 NOTE — Progress Notes (Signed)
Physical Therapy Session Note  Patient Details  Name: Shelby Arellano MRN: [id number] Date of Birth: [birth year]  Today's Date: 03/12/2021 PT Individual Time: 0900-1000 and 1345-1430 PT Individual Time Calculation (min): 60 min and 45 mins  Short Term Goals: Week 1:  PT Short Term Goal 1 (Week 1): pt to demonstrate supine<>sit mod A of one PT Short Term Goal 1 - Progress (Week 1): Met PT Short Term Goal 2 (Week 1): pt to demonstrate functional transfers with LRAD mod A x1 PT Short Term Goal 2 - Progress (Week 1): Met PT Short Term Goal 3 (Week 1): to initate gait with LRAD at least 25' mod A PT Short Term Goal 3 - Progress (Week 1): Not met PT Short Term Goal 4 (Week 1): pt to tolerate sitting OOB 1 hour PT Short Term Goal 4 - Progress (Week 1): Met Week 2:  PT Short Term Goal 1 (Week 2): pt to demonstrate min A supine<>sit PT Short Term Goal 2 (Week 2): pt to demonstrate functional transfers with LRAD min A consistently PT Short Term Goal 3 (Week 2): pt to demonstrate 50' min A gait with LRAD PT Short Term Goal 4 (Week 2): pt to tolerate standing at min A for at least 5 mins Week 3:     Skilled Therapeutic Interventions/Progress Updates:    session 1:pt received in bed and agreeable to therapy. Husband present. Pt directed in supine>sit with VC for log rolling with pt attempting to crunch forward out of bed, CGA for log rolling with use of bed rail. Pt sat EOB SBA, PT donned TLSO max A in sitting. Pt directed in Sit to stand from bed to Rolling walker min A and x5 steps to [name] in room min A with Rolling walker. Pt taken to day room in [name] total A for time and energy. Pt directed in gait training with Rolling walker min A -CGA for 35'x2 and 50' with VC for trunk extension, increased stride length and consistent stepping to encourage improved cadence and overall gait pattern. Pt directed in standing balance on level surface, CGA with VC to not lean onto table for support at forearms to encourage  trunk extension and balance retraining, card matching activity with x12 cards completed with pt then reporting dizziness, directed to return to sitting, post rest break and resolved symptoms, pt directed to continue task, min A Sit to stand to table with Rolling walker for support however pt demonstrated increased anxiety with attempting task in standing and reported "I can't do this, I'm too old and I don't understand this." pt directed in returning to sitting, PT explained this task is meant to improve tolerance to standing, standing balance and add cognitive component to simulate duel tasks at home. Pt calmed but declined to reattempt. Pt directed to transfer back to [name], min A and returned to room in [name] total A. Pt agreeable to sit up though tearful at end of session. Husband encouraged pt to attempt to sit up for improved tolerance in sitting with pt reporting she understood but "I just want the pain to go away." pt left in [name] WC reclined for comfort, husband present, All needs in reach and in good condition. Call light in hand.    Session 2: pt received in bed and agreeable to therapy. granddaughter present. Pt reported she needed to use restroom. Pt directed in supine>sit with log roll technique CGA. Pt directed in donning brace max A at EOB. Pt directed in Sit to  stand to Rolling walker min A and gait to toilet min A. Pt (+) bladder void, min A for stability with pericare, total A for brief and skirt management. Pt directed in gait back to EOB with Rolling walker min A and requested to have skin protection bandages at chest as per pt the TLSO chest piece was bothersome to her, bandages placed and TLSO returned to position pt reported comfort. Granddaughter left at this time. Pt directed in gait training with Rolling walker 32' min Ax2 with poor cadence and mildly flexed trunk, minimal effect with VC. Pt requested to return to bed at end of session, TLSO doffed in sitting. Pt directed in sit>supine min A  for  leg management. Pt min A for positioning once in bed. Pt left in supine, All needs in reach and in good condition. Call light in hand.  And alarm set. Pt able to recall 0/3 BLTs spine precautions at start of session and 2/3 at end of session, able to get 3/3 with cues.   Therapy Documentation Precautions:  Precautions Precautions: Back,Sternal,Fall Precaution Comments: brace when OOB Required Braces or Orthoses: Spinal Brace Spinal Brace: Thoracolumbosacral orthotic,Applied in sitting position Restrictions Weight Bearing Restrictions: No General:   Vital Signs: Therapy Vitals Temp: 97.7 F (36.5 C) Pulse Rate: 79 Resp: 16 BP: (!) 135/55 Patient Position (if appropriate): Lying Oxygen Therapy SpO2: 95 % O2 Device: Room Air Pain:   Mobility:   Locomotion :    Trunk/Postural Assessment :    Balance:   Exercises:   Other Treatments:      Therapy/Group: Individual Therapy  [name] 03/12/2021, 3:38 PM

## 2021-03-13 LAB — COMPREHENSIVE METABOLIC PANEL
ALT: 21 U/L (ref 0–44)
AST: 20 U/L (ref 15–41)
Albumin: 2.7 g/dL — ABNORMAL LOW (ref 3.5–5.0)
Alkaline Phosphatase: 231 U/L — ABNORMAL HIGH (ref 38–126)
Anion gap: 8 (ref 5–15)
BUN: 10 mg/dL (ref 8–23)
CO2: 24 mmol/L (ref 22–32)
Calcium: 8.9 mg/dL (ref 8.9–10.3)
Chloride: 106 mmol/L (ref 98–111)
Creatinine, Ser: 0.85 mg/dL (ref 0.44–1.00)
GFR, Estimated: >60 mL/min (ref >60)
Glucose, Bld: 90 mg/dL (ref 70–99)
Potassium: 3.4 mmol/L — ABNORMAL LOW (ref 3.5–5.1)
Sodium: 138 mmol/L (ref 135–145)
Total Bilirubin: 0.8 mg/dL (ref 0.3–1.2)
Total Protein: 5.5 g/dL — ABNORMAL LOW (ref 6.5–8.1)

## 2021-03-13 LAB — URINALYSIS, ROUTINE W REFLEX MICROSCOPIC
Bilirubin Urine: NEGATIVE
Glucose, UA: NEGATIVE mg/dL
Ketones, ur: NEGATIVE mg/dL
Nitrite: POSITIVE — AB
Protein, ur: NEGATIVE mg/dL
Specific Gravity, Urine: 1.011 (ref 1.005–1.030)
WBC, UA: >50 WBC/hpf — ABNORMAL HIGH (ref 0–5)
pH: 5 (ref 5.0–8.0)

## 2021-03-13 MED ORDER — PHENAZOPYRIDINE HCL 100 MG PO TABS
100.0000 mg | ORAL_TABLET | Freq: Three times a day (TID) | ORAL | Status: AC
  Administered 2021-03-13 – 2021-03-15 (×6): 100 mg via ORAL
  Filled 2021-03-13 (×6): qty 1

## 2021-03-13 NOTE — Progress Notes (Signed)
Physical Therapy Session Note  Patient Details  Name: LOVINA ZUVER MRN: [id number] Date of Birth: 11-04-1935  Today's Date: 03/13/2021 PT Individual Time: 1300-1330 PT Individual Time Calculation (min): 30 min   Short Term Goals: Week 2:  PT Short Term Goal 1 (Week 2): pt to demonstrate min A supine<>sit PT Short Term Goal 2 (Week 2): pt to demonstrate functional transfers with LRAD min A consistently PT Short Term Goal 3 (Week 2): pt to demonstrate 50' min A gait with LRAD PT Short Term Goal 4 (Week 2): pt to tolerate standing at min A for at least 5 mins  Skilled Therapeutic Interventions/Progress Updates:    Patient received reclined in bed, finishing lunch, agreeable to PT. She denies pain. She was able to come sit edge of bed with supervision, HOB elevated and verbal cues for back precautions. CGA stand pivot transfer to wc with RW. PT transport to therapy gym for time management and energy conservation. Patient ambulating 50ft with RW and light CGA/ close supervision. Very slow gait speed and forward flexed posture maintained. Patient with seated rest break and then ambulated additional 40ft with cues for increased gait speed. Patient maintained forward flexed posture despite multimodal cues for correction. Patient agreeable to remain up in wc, seatbelt alarm on, call light within reach.   Therapy Documentation Precautions:  Precautions Precautions: Back,Sternal,Fall Precaution Comments: brace when OOB Required Braces or Orthoses: Spinal Brace Spinal Brace: Thoracolumbosacral orthotic,Applied in sitting position Restrictions Weight Bearing Restrictions: No    Therapy/Group: Individual Therapy  Elizebeth Koller, PT, DPT, CBIS  03/13/2021, 7:42 AM

## 2021-03-13 NOTE — Progress Notes (Signed)
PROGRESS NOTE   Subjective/Complaints:  Pt reports still burning when she voids- it sounds like has UTI even though Cx last week was (-)- will start Pyridium x 2 days and recheck U/A and Cx.  Also notes back incision itching less with bandage change.   ROS:   Pt denies SOB, abd pain, CP, N/V/C/D, and vision changes   Objective:   No results found. Recent Labs    03/12/21 0459  WBC 6.7  HGB 10.1*  HCT 31.5*  PLT 375   Recent Labs    03/13/21 0505  NA 138  K 3.4*  CL 106  CO2 24  GLUCOSE 90  BUN 10  CREATININE 0.85  CALCIUM 8.9    Intake/Output Summary (Last 24 hours) at 03/13/2021 7106 Last data filed at 03/13/2021 0841 Gross per 24 hour  Intake 480 ml  Output --  Net 480 ml        Physical Exam: Vital Signs Blood pressure (!) 115/57, pulse 74, temperature 98.4 F (36.9 C), resp. rate 14, height 5\' 3"  (1.6 m), weight 57.6 kg, SpO2 98 %.    General: awake, alert, appropriate, very interactive; NAD HENT: conjugate gaze; oropharynx moist CV: regular rate; no JVD Pulmonary: CTA B/L; no W/R/R- good air movement GI: soft, NT, ND, (+)BS Psychiatric: appropriate- interactive;  Neurological: Ox3 Skin: back incision dressed. Not visualized today Musculoskeletal: . Sensation intact.  MMT 3-4/5 in LE, pain inhibition   Assessment/Plan: 1. Functional deficits which require 3+ hours per day of interdisciplinary therapy in a comprehensive inpatient rehab setting.  Physiatrist is providing close team supervision and 24 hour management of active medical problems listed below.  Physiatrist and rehab team continue to assess barriers to discharge/monitor patient progress toward functional and medical goals  Care Tool:  Bathing    Body parts bathed by patient: Right arm,Left arm,Chest,Abdomen,Front perineal area,Right upper leg,Left upper leg,Right lower leg,Left lower leg,Face   Body parts bathed by  helper: Buttocks     Bathing assist Assist Level: Minimal Assistance - Patient > 75%     Upper Body Dressing/Undressing Upper body dressing   What is the patient wearing?: Pull over shirt    Upper body assist Assist Level: Set up assist    Lower Body Dressing/Undressing Lower body dressing      What is the patient wearing?: Incontinence brief     Lower body assist Assist for lower body dressing: Maximal Assistance - Patient 25 - 49%     Toileting Toileting    Toileting assist Assist for toileting: Moderate Assistance - Patient 50 - 74%     Transfers Chair/bed transfer  Transfers assist  Chair/bed transfer activity did not occur: Safety/medical concerns  Chair/bed transfer assist level: Minimal Assistance - Patient > 75%     Locomotion Ambulation   Ambulation assist   Ambulation activity did not occur: Safety/medical concerns  Assist level: Contact Guard/Touching assist Assistive device: Walker-rolling Max distance: 15   Walk 10 feet activity   Assist  Walk 10 feet activity did not occur: Safety/medical concerns  Assist level: Contact Guard/Touching assist Assistive device: Walker-rolling   Walk 50 feet activity   Assist Walk 50 feet with  2 turns activity did not occur: Safety/medical concerns  Assist level: Contact Guard/Touching assist Assistive device: Walker-rolling    Walk 150 feet activity   Assist Walk 150 feet activity did not occur: Safety/medical concerns (fatigue)         Walk 10 feet on uneven surface  activity   Assist Walk 10 feet on uneven surfaces activity did not occur: Safety/medical concerns         Wheelchair     Assist Will patient use wheelchair at discharge?: Yes Type of Wheelchair: Manual Wheelchair activity did not occur: Safety/medical concerns         Wheelchair 50 feet with 2 turns activity    Assist    Wheelchair 50 feet with 2 turns activity did not occur: Safety/medical  concerns       Wheelchair 150 feet activity     Assist  Wheelchair 150 feet activity did not occur: Safety/medical concerns       Blood pressure (!) 115/57, pulse 74, temperature 98.4 F (36.9 C), resp. rate 14, height 5\' 3"  (1.6 m), weight 57.6 kg, SpO2 98 %.  1.  Lumbar burst fracture s/p ORIF T11-L4- might be SCI with neurogenic bowel and bladder notable.              -ELOS/Goals: minA 2-3 weeks  -con't PT and OT-CIR 2.  Impaired mobility: -DVT/anticoagulation: Continue Lovenox 40 mg daily since renal function fine             -antiplatelet therapy: N/A 3. Pain Management: Oxycodone and robaxin prn.              --Lidocaine patches x3 added  for pain control             --Tylenol 650mg  TID scheduled.   -added scheduled oxycodone 5mg  daily 7:30am and 1:30pm   4/23 added 2.5mg  scheduled at night as well.     4/24 pain control much improved   4/25- says pain is still an issue- will move 7:30 am pain meds to 6:30 am  4/26- appears "much better" this AM- con't regimen     Continue prn oxycodone at 2.5mg  q8H prn to limit neurosedation   -Obtained XR right wrist where pain was worst--negative.    - Apply ice. Pain improved with therapy and at rest.  4. Anxiety and depression: LCSW to follow for evaluation and support. Discussed with son her premorbid issues. Discussed starting Remeron for her anxiety and depression and son and husband agreeable. Start 7.5mg  4/20. Well tolerated, conitnue  4/26- doing MUCH better emotionally- con't regimen             -antipsychotic agents: N/A 5. Neuropsych: This patient may be intermittently capable of making decisions on her own behalf. 6. Skin/Wound Care: Routine pressure relief measures. May dc IV 7. Fluids/Electrolytes/Nutrition: Monitor I/O. Monitor lytes weekly. Eating much better with Remeron! 8. L2 burst fracture: Back precautions with brace when at EOB/OOB 9. Sternal fracture: Encourage IS.  Continue lidocaine patch.  10. Neurogenic  bladder: voiding well, incontinence, placed order for timed voiding q2H while awake.  11. Neurogenic bowel: Was incontinent during the night ( Movantik, miralax and senna taken this am).              --On colace, miralax, senna, movantik and daily suppository but laxative/suppositories have been refused/held multiple days.               --Will order KUB to monitor for stool burden as family reports  poor intake.              --will start with Senna S two in am and suppository after supper for bowel program.  4/12- cannot assess if pt is SCI pt- but is at high risk- will determine when can check strength exam.   4/23 moving bowels, formed stools.  4/25- less likely to have SCI since having regular stools without bowel program.   12. Acute blood loss anemia: H/H down to 9.2/94.9-->recheck in am.  13. Hyperglycemia: Likely due to stress--will order A1C for am.  14. Hypocalcemia: Ionized calcium 1.11.             --added calcium supplement.  15. Hypertension: systolic BP mildly elevated, monitor TID.  4/14- BP 120s/70s- con't regimen 15. Baseline: patient used to walk 1-2 miles per day with her husband. Was very active around her home.   16. Hypokalemia  4/12- K+ 3.0- repleted and will recheck next 2 days.  4/13- K+ 3.8 today- will recheck Friday  17. Confusion: improved with decreasing oxycodone.  18. Diarrhea: d/c dulcolax suppository.  19. Dysuria:  F/u ucx negative. Sx improved  4/25- pt reports still having burning intermittently- but last check showed (+) U/A but no growth- if continues, will resend  4/26- will start Pyridium 100 mg TID x 2 days and recheck U/A and Cx, just to make sure- not sure Cx last week was indicative of Sx's.    LOS: 15 days A FACE TO FACE EVALUATION WAS PERFORMED  Styles Fambro 03/13/2021, 9:22 AM

## 2021-03-13 NOTE — Plan of Care (Signed)
  Problem: Consults Goal: RH SPINAL CORD INJURY PATIENT EDUCATION Description:  See Patient Education module for education specifics.  Outcome: Progressing Goal: Skin Care Protocol Initiated - if Braden Score 18 or less Description: If consults are not indicated, leave blank or document N/A Outcome: Progressing   Problem: RH SKIN INTEGRITY Goal: RH STG MAINTAIN SKIN INTEGRITY WITH ASSISTANCE Description: STG Maintain Skin Integrity With min Assistance. Outcome: Progressing Goal: RH STG ABLE TO PERFORM INCISION/WOUND CARE W/ASSISTANCE Description: STG Able To Perform Incision/Wound Care With min Assistance. Outcome: Progressing   Problem: RH SAFETY Goal: RH STG ADHERE TO SAFETY PRECAUTIONS W/ASSISTANCE/DEVICE Description: STG Adhere to Safety Precautions With min Assistance/Device. Outcome: Progressing Goal: RH STG DECREASED RISK OF FALL WITH ASSISTANCE Description: STG Decreased Risk of Fall With min Assistance. Outcome: Progressing   Problem: RH PAIN MANAGEMENT Goal: RH STG PAIN MANAGED AT OR BELOW PT'S PAIN GOAL Description: <4 on a 0-10 pain scale Outcome: Progressing   Problem: RH KNOWLEDGE DEFICIT SCI Goal: RH STG INCREASE KNOWLEDGE OF SELF CARE AFTER SCI Description: Patient will demonstrate knowledge of medication management, pain management, skin/wound care, and weight bearing precautions with educational materials and handouts provided by staff, at discharge independently. Outcome: Progressing   

## 2021-03-13 NOTE — Progress Notes (Signed)
Occupational Therapy Session Note  Patient Details  Name: Shelby Arellano MRN: [id number] Date of Birth: [birth year]  Today's Date: 03/13/2021 OT Individual Time: 4008-6761 and 9509-3267 OT Individual Time Calculation (min): 43 min and 60 min   Short Term Goals: Week 1:  OT Short Term Goal 1 (Week 1): Pt will maintain sitting EOB for 5 mins with supervision during self-care tasks OT Short Term Goal 1 - Progress (Week 1): Met OT Short Term Goal 2 (Week 1): Pt will complete bathing wtih mod assist of one caregiver OT Short Term Goal 2 - Progress (Week 1): Progressing toward goal OT Short Term Goal 3 (Week 1): Pt will complete stand pivot transfer with mod assist of one caregiver OT Short Term Goal 3 - Progress (Week 1): Met Week 2:  OT Short Term Goal 1 (Week 2): Pt will perform stand pivot transfer Min A of one caregiver OT Short Term Goal 2 (Week 2): Pt will consistently perform sit <> stands Min A to decrease caregiver burden OT Short Term Goal 3 (Week 2): Pt will thread LEs for LB dress with reacher no more than Mod A OT Short Term Goal 4 (Week 2): Pt will perform UB ADLs with no more than Min A seated EOB or sink   Skilled Therapeutic Interventions/Progress Updates:    Session 1: Pt greeted at time of session semireclined in bed resting with husband present who remained throughout session. Supine > sit EOB Min/Mod A with pt able to log roll onto R side, assist for trunk elevation. Donned brace total A and later having pt's husband don as well with Min A. Walked bed > bathroom CGA with Min for sit <> stand at RW and transferred to commode same manner, max A today for brief management and able to complete perihygiene but total a for buttocks to fully clean buttocks (for thoroughness per preference did not have BM). Walked back to bed same manner and assisted husband with training for [facility] use as wanted to learn to take pt to/from bathroom to assist. Husband able to use with Supervision for cues  to use brakes/levers and would benefit from one more training session, able to transfer pt <> toilet with Stedy. Set up in wheelchair with alarmon call bell in reach for next session.   Session 2: Pt greeted at time of session resting sitting up in wheelchair agreeable to OT session, wanting to brush/groom hair. Set up at sink for grooming tasks, cues for sternal precautions and able to perform with Supervision. Transported to ADL apartment in [place] and performed functional mobility task throughout kitchen, walking and gathering fruit/items from around kitchen to simulate home, education on walker management in kitchen as she is not used to having one, able to perfom CGA. Transported back to room and walked [name] > bathroom > bed CGA/Min A with RW and transfer to toilet same manner. CGA hygiene in front for urine only. Sit > supine in bed Mod A for BLEs and log roll. Discussion with son regarding home set up, DME needs, DC planning, etc. Alarm on call bell in reach.     Therapy Documentation Precautions:  Precautions Precautions: Back,Sternal,Fall Precaution Comments: brace when OOB Required Braces or Orthoses: Spinal Brace Spinal Brace: Thoracolumbosacral orthotic,Applied in sitting position Restrictions Weight Bearing Restrictions: No    Therapy/Group: Individual Therapy  Viona Gilmore 03/13/2021, 7:11 AM

## 2021-03-13 NOTE — Progress Notes (Signed)
Patient ID: Shelby Arellano, female   DOB: Jul 11, 1935, 86 y.o.   MRN: [id number] Team Conference Report to Patient/Family  Team Conference discussion was reviewed with the patient and caregiver, including goals, any changes in plan of care and target discharge date.  Patient and caregiver express understanding and are in agreement.  The patient has a target discharge date of 03/21/21.  SW met with pt and son in room. Addressed question and concerns. Pt has nursing concern with continence, but mentioned dit has improved today. No additional questions, sw will cont to follow up  Dyanne Iha 03/13/2021, 1:39 PM

## 2021-03-13 NOTE — Progress Notes (Signed)
Physical Therapy Weekly Progress Note  Patient Details  Name: Shelby Arellano MRN: [id number] Date of Birth: November 23, 1934  Beginning of progress report period: March 06, 2021 End of progress report period: March 13, 2021  Today's Date: 03/13/2021 PT Individual Time: 4128-7867 PT Individual Time Calculation (min): 60 min   Patient has met 2 of 4 short term goals.  Pt continues to be limited 2/2 pain and decreased tolerance to activity and intermittent confusion. Pt currently requires min A bed mobility, min A transfers, gait min A with Rolling walker for up to 118' as of this date.   Patient continues to demonstrate the following deficits muscle weakness, decreased cardiorespiratoy endurance, decreased coordination and decreased motor planning and decreased standing balance, decreased postural control and decreased balance strategies and therefore will continue to benefit from skilled PT intervention to increase functional independence with mobility.  Patient progressing toward long term goals..  Continue plan of care.  PT Short Term Goals Week 1:  PT Short Term Goal 1 (Week 1): pt to demonstrate supine<>sit mod A of one PT Short Term Goal 1 - Progress (Week 1): Met PT Short Term Goal 2 (Week 1): pt to demonstrate functional transfers with LRAD mod A x1 PT Short Term Goal 2 - Progress (Week 1): Met PT Short Term Goal 3 (Week 1): to initate gait with LRAD at least 25' mod A PT Short Term Goal 3 - Progress (Week 1): Not met PT Short Term Goal 4 (Week 1): pt to tolerate sitting OOB 1 hour PT Short Term Goal 4 - Progress (Week 1): Met Week 2:  PT Short Term Goal 1 (Week 2): pt to demonstrate min A supine<>sit PT Short Term Goal 1 - Progress (Week 2): Met PT Short Term Goal 2 (Week 2): pt to demonstrate functional transfers with LRAD min A consistently PT Short Term Goal 2 - Progress (Week 2): Met PT Short Term Goal 3 (Week 2): pt to demonstrate 50' min A gait with LRAD PT Short Term Goal 3 -  Progress (Week 2): Not met PT Short Term Goal 4 (Week 2): pt to tolerate standing at min A for at least 5 mins PT Short Term Goal 4 - Progress (Week 2): Not met  Skilled Therapeutic Interventions/Progress Updates:    pt received in [name] and agreeable to therapy. Husband present. Pt taken to gym in [name] total A for time and energy. Pt directed in gait training with Rolling walker 118' min A for stability VC for trunk extension, hip extension, increased stride length. Pt directed in standing marching 2x10 at 6" step with Rolling walker for support at CGA-min A. Pt required rest breaks in sitting to recover from all activity. Pt taken back to room in [facility] total A for time. Pt requested to use restroom, directed in gait training to toilet min A with Rolling walker and able to void bladder, min A for stability for hygiene and brief management. Pt directed in gait to bed min A and min A for one LE management onto bed for sit>supine. Pt left in bed, All needs in reach and in good condition. Call light in hand.  And alarm set. Husband present.  Pt perseverating on medication schedule during session and nursing and MD made aware.   Therapy Documentation Precautions:  Precautions Precautions: Back,Sternal,Fall Precaution Comments: brace when OOB Required Braces or Orthoses: Spinal Brace Spinal Brace: Thoracolumbosacral orthotic,Applied in sitting position Restrictions Weight Bearing Restrictions: No General:   Vital Signs:  Pain:  Vision/Perception     Mobility:   Locomotion :    Trunk/Postural Assessment :    Balance:   Exercises:   Other Treatments:     Therapy/Group: Individual Therapy  [name] 03/13/2021, 11:47 AM

## 2021-03-13 NOTE — Patient Care Conference (Signed)
Inpatient RehabilitationTeam Conference and Plan of Care Update Date: 03/13/2021   Time: 11:30 AM    Patient Name: Shelby Arellano      Medical Record Number: [id number]  Date of Birth: 1935/01/14 Sex: Female         Room/Bed: 4M13C/4M13C-01 Payor Info: Payor: AARP / Plan: AARP / Product Type: *No Product type* /    Admit Date/Time:  02/26/2021  2:18 PM  Primary Diagnosis:  Lumbar burst fracture, sequela  Hospital Problems: Principal Problem:   Lumbar burst fracture, sequela Active Problems:   Acute pain due to trauma   Pain    Expected Discharge Date: Expected Discharge Date: 03/21/21  Team Members Present: Physician leading conference: Dr. Genice Rouge Care Coodinator Present: Kennyth Arnold, RN, BSN, CRRN;Christina Camargo, BSW Nurse Present: Kennyth Arnold, RN PT Present: Otelia Sergeant, PT OT Present: Earleen Newport, OT PPS Coordinator present : Edson Snowball, PT     Current Status/Progress Goal Weekly Team Focus  Bowel/Bladder   occassional incontinence of bladder. Continent bowel LBM 4/25  pt will have increased continence  timed toilet during day   Swallow/Nutrition/ Hydration             ADL's   walking to bathroom, Min transfers w/ RW, bathing shower level Min, LB dressing Max, still has pain but much improved  Min A overall  pain management, OOB tolerance, functional mobility and transferes for ADL, standing balance/tolerance, posture, pt and family education   Mobility   min A for bed mob, transfers min A, CGA static sitting balance, gait 100' min A, standing bal CGA-MIN A  CGA transfers and gait 150' LRAD, supervision bed mob, supervision WC  balance transfers, gait, tolerance OOB, pain management   Communication             Safety/Cognition/ Behavioral Observations            Pain   back pain 5 out of 10. oxy scheduled and PRN  pt will have pain that is <=3/10  assess pain q shift and PRN   Skin   Surgical dressing to back. generalized brusing   surgical site will remain free of infection, improvement of MASD, no further skin breakdown  assess skin q shift. dressing change to back daily starting 4/26     Discharge Planning:  Patient set to discharge home with spouse and able to provide 24/7 supervision   Team Discussion: UA was positive, culture was negative/going to reorder. Moved pain medications to mornings before therapy. Continent B/B, spasms to the back, managed with medications. Surgical site to the back, bruising to the sides of abdomen. Surgical site is a daily dressing change. Discharging home with her husband. Patient on target to meet rehab goals: yes, husband has been there for every session, she has shown good progress. Walking to and from the bathroom with min assist. Max assist for toileting tasks. ADL transfers have improved. Walked 115 ft with min assist today. Sit to stands are min assist to contact guard. Husband is very supportive. Anxious about medication times, nursing can write next time due on the white board for patient.  *See Care Plan and progress notes for long and short-term goals.   Revisions to Treatment Plan:  Rechecking UA/C&S  Teaching Needs: Family education, medication management, pain management, skin/wound care, transfer training, gait training, balance training, endurance training, safety awareness.  Current Barriers to Discharge: Decreased caregiver support, Medical stability, Home enviroment access/layout, Incontinence, Wound care, Lack of/limited family support, Weight bearing  restrictions, Medication compliance, Behavior and Nutritional means  Possible Resolutions to Barriers: Continue current medications, provide emotional support.     Medical Summary Current Status: think pt has UTI- burning-  continent usually/overall B/B- no sleep issues now- on remeron; incision on back- daily dressing on back  Barriers to Discharge: Decreased family/caregiver support;Home enviroment  access/layout;Incontinence;Medical stability;Weight bearing restrictions;Wound care;Nutrition means  Barriers to Discharge Comments: husband 24/7- started family ed training- great progress in last week- pain much more controlled Possible Resolutions to [employer] Focus: checking U/A and Cx and added Pyridium x2 days;  focus- pain and nutrition- since started remeron for mood/sleep/appetite- pt needs to know timing of prn meds- suggest writing on board when due next- d/c- 03/21/21   Continued Need for Acute Rehabilitation Level of Care: The patient requires daily medical management by a physician with specialized training in physical medicine and rehabilitation for the following reasons: Direction of a multidisciplinary physical rehabilitation program to maximize functional independence : Yes Medical management of patient stability for increased activity during participation in an intensive rehabilitation regime.: Yes Analysis of laboratory values and/or radiology reports with any subsequent need for medication adjustment and/or medical intervention. : Yes   I attest that I was present, lead the team conference, and concur with the assessment and plan of the team.   Tennis Must 03/13/2021, 6:12 PM

## 2021-03-14 MED ORDER — CEPHALEXIN 250 MG PO CAPS
500.0000 mg | ORAL_CAPSULE | Freq: Two times a day (BID) | ORAL | Status: AC
  Administered 2021-03-14 – 2021-03-18 (×10): 500 mg via ORAL
  Filled 2021-03-14 (×10): qty 2

## 2021-03-14 NOTE — Progress Notes (Signed)
Physical Therapy Session Note  Patient Details  Name: Shelby Arellano MRN: [id number] Date of Birth: 07/21/1935  Today's Date: 03/14/2021 PT Individual Time: 1004-1031 PT Individual Time Calculation (min): 27 min   Short Term Goals: Week 3:  PT Short Term Goal 1 (Week 3): pt to demonstrate 50' min A gait with LRAD consistently PT Short Term Goal 2 (Week 3): pt to tolerate standing at min A for at least 5 mins PT Short Term Goal 3 (Week 3): pt to demonstrate bed mobility CGA PT Short Term Goal 4 (Week 3): pt to demonstrate functional transfers CGA with LRAD  Skilled Therapeutic Interventions/Progress Updates:    Pt received sitting in [name] w/c with her husband present and pt agreeable to therapy session. Pt wearing TLSO throughout session. Transported to/from gym in w/c for time management and energy conservation. Sit<>stands using RW with CGA during session - cuing for proper UE placement during transfer for safety with AD. Gait training ~160ft x2 to/from ortho gym using RW with CGA - pt continues to demo slow gait speed with excessive hip flexion and decreased B LE step lengths and foot clearances - cuing and facilitation for improvement throughout.  Performed B LE reciprocal movement patterns on Nustep targeting strengthening and endurance against level 2 (increased to level 3) resistance for 83min30sec totaling 180steps - pt demos excessive hip IR/adduction requiring repeated cuing for improved alignment throughout. SpO2 98-100% and HR 88bpm. Pt transported back to room and left sitting tilted back in [name] w/c with needs in reach and her husband present.  Therapy Documentation Precautions:  Precautions Precautions: Back,Sternal,Fall Precaution Comments: brace when OOB Required Braces or Orthoses: Spinal Brace Spinal Brace: Thoracolumbosacral orthotic,Applied in sitting position Restrictions Weight Bearing Restrictions: No  Pain: No specific reports of pain during session.   Therapy/Group:  Individual Therapy  Ginny Forth , PT, DPT, CSRS  03/14/2021, 7:58 AM

## 2021-03-14 NOTE — Evaluation (Incomplete)
Recreational Therapy Assessment and Plan  Patient Details  Name: Shelby Arellano MRN: [id number] Date of Birth: March 02, 1935 Today's Date: 03/14/2021  Rehab Potential:   Good ELOS:   d/c 5/4  Assessment     Pt presents with decreased activity tolerance, decreased functional mobility, decreased balance   Plan    Recommendations for other services: {RECOMMENDATIONS FOR OTHER SERVICES:3049016}  Discharge Criteria: Patient will be discharged from TR if patient refuses treatment 3 consecutive times without medical reason.  If treatment goals not met, if there is a change in medical status, if patient makes no progress towards goals or if patient is discharged from hospital.  The above assessment, treatment plan, treatment alternatives and goals were discussed and mutually agreed upon: {Assessment/Treatment Plan Discussed/Agreed:3049017}  Shelby Arellano 03/14/2021, 8:48 AM

## 2021-03-14 NOTE — Progress Notes (Signed)
Recreational Therapy Session Note  Patient Details  Name: Shelby Arellano MRN: [id number] Date of Birth: [birth year] Today's Date: 03/14/2021  Pain: c/o pain, unrated- nursing aware & medicating Skilled Therapeutic Interventions/Progress Updates: pt in bed, husband at bedside.  Pt frustrated stating "it would be helpful to know what's next.  I haven't had my medicine."  Addressed concerns, nurse in to address medication administration.  Eval incomplete at this time.   Homero Hyson 03/14/2021, 3:27 PM

## 2021-03-14 NOTE — Progress Notes (Signed)
Occupational Therapy Session Note  Patient Details  Name: Shelby Arellano MRN: [id number] Date of Birth: [birth year]  Today's Date: 03/14/2021 OT Individual Time: 5830-9407 and 6808-8110 OT Individual Time Calculation (min): 58 min and 28 min   Short Term Goals: Week 1:  OT Short Term Goal 1 (Week 1): Pt will maintain sitting EOB for 5 mins with supervision during self-care tasks OT Short Term Goal 1 - Progress (Week 1): Met OT Short Term Goal 2 (Week 1): Pt will complete bathing wtih mod assist of one caregiver OT Short Term Goal 2 - Progress (Week 1): Progressing toward goal OT Short Term Goal 3 (Week 1): Pt will complete stand pivot transfer with mod assist of one caregiver OT Short Term Goal 3 - Progress (Week 1): Met Week 2:  OT Short Term Goal 1 (Week 2): Pt will perform stand pivot transfer Min A of one caregiver OT Short Term Goal 2 (Week 2): Pt will consistently perform sit <> stands Min A to decrease caregiver burden OT Short Term Goal 3 (Week 2): Pt will thread LEs for LB dress with reacher no more than Mod A OT Short Term Goal 4 (Week 2): Pt will perform UB ADLs with no more than Min A seated EOB or sink   Skilled Therapeutic Interventions/Progress Updates:    Session 1: Pt greeted at time of session semireclined in bed with husband present, agreeable to OT session and feeling good this am. Agreeable to shower. Supine > sit Min/CGA with bed features. Donned brace total A for time, walked bed > shower with CGA with RW and transferred to bench same manner. Discussion throughout with husband and pt regarding home set up requesting pictures of bathroom. Doffed LB clothing with assist for time, seated doffed brace and shirt and educated on sequencing for shower at home. Back incision covered with tegaderm. UB/LB bathing shower level Min A overall for buttocks only, able to use LHS for feet and back, needing frequent cues for sequencing pt stating "I havent done this in a while." Dried off  same manner, donned new shirt Mod A within precautions with assist d/t tight fit, donned brace with education sitting edge of bench. Donned socks dependent for time and back precautions, brief Max A. Husband providing CGA walking bathroom > wheelchair with RW. Set up with semireclined [name] call bell in reach all needs met.   Session 2: Pt greeted at time of session sitting up in wheelchair agreeable to OT session, wanting to use bathroom. Pt ambulated to/from bathroom CGA with RW, transferred to toilet same manner. Husband assisted with providing CGA, placing one hand on gait belt and other on shoulder, educated on hand placement to ensure safety for the pt and good carryover. Husband providing Max A for hygiene after BM with pt and walked pt bathroom > bed with CGA as well. Doffed brace without cues. Sit > supine CGA/Min for LEs. Alarm on call bell in reach. Checked off husband on safety plan to walk pt to/from bathroom and communicated with nursing as well.   Therapy Documentation Precautions:  Precautions Precautions: Back,Sternal,Fall Precaution Comments: brace when OOB Required Braces or Orthoses: Spinal Brace Spinal Brace: Thoracolumbosacral orthotic,Applied in sitting position Restrictions Weight Bearing Restrictions: No     Therapy/Group: Individual Therapy  Viona Gilmore 03/14/2021, 7:15 AM

## 2021-03-14 NOTE — Progress Notes (Signed)
PROGRESS NOTE   Subjective/Complaints:  Ate most of breakfast- pyridium MUCH better- helps a lot- Had BM in last 24 hours- little loose. Not on any bowel meds- so can use lomotil prn.   ROS:   Pt denies SOB, abd pain, CP, N/V/C/D, and vision changes   Objective:   No results found. Recent Labs    03/12/21 0459  WBC 6.7  HGB 10.1*  HCT 31.5*  PLT 375   Recent Labs    03/13/21 0505  NA 138  K 3.4*  CL 106  CO2 24  GLUCOSE 90  BUN 10  CREATININE 0.85  CALCIUM 8.9    Intake/Output Summary (Last 24 hours) at 03/14/2021 0757 Last data filed at 03/13/2021 1815 Gross per 24 hour  Intake 600 ml  Output 0 ml  Net 600 ml        Physical Exam: Vital Signs Blood pressure 139/68, pulse 68, temperature 98.1 F (36.7 C), temperature source Oral, resp. rate 18, height 5\' 3"  (1.6 m), weight 57.6 kg, SpO2 100 %.     General: awake, alert, appropriate,  Sitting up in bed; ate almost 100% of tray; NAD HENT: conjugate gaze; oropharynx moist CV: regular rate; no JVD Pulmonary: CTA B/L; no W/R/R- good air movement GI: soft, NT, ND, (+)BS- normoactive Psychiatric: appropriate- much more interactive Neurological: Ox3 Skin: back incision dressed. Not visualized today Musculoskeletal: . Sensation intact.  MMT 3-4/5 in LE, pain inhibition   Assessment/Plan: 1. Functional deficits which require 3+ hours per day of interdisciplinary therapy in a comprehensive inpatient rehab setting.  Physiatrist is providing close team supervision and 24 hour management of active medical problems listed below.  Physiatrist and rehab team continue to assess barriers to discharge/monitor patient progress toward functional and medical goals  Care Tool:  Bathing    Body parts bathed by patient: Right arm,Left arm,Chest,Abdomen,Front perineal area,Right upper leg,Left upper leg,Right lower leg,Left lower leg,Face   Body parts bathed by  helper: Buttocks     Bathing assist Assist Level: Minimal Assistance - Patient > 75%     Upper Body Dressing/Undressing Upper body dressing   What is the patient wearing?: Pull over shirt    Upper body assist Assist Level: Set up assist    Lower Body Dressing/Undressing Lower body dressing      What is the patient wearing?: Incontinence brief     Lower body assist Assist for lower body dressing: Maximal Assistance - Patient 25 - 49%     Toileting Toileting    Toileting assist Assist for toileting: Maximal Assistance - Patient 25 - 49%     Transfers Chair/bed transfer  Transfers assist  Chair/bed transfer activity did not occur: Safety/medical concerns  Chair/bed transfer assist level: Minimal Assistance - Patient > 75%     Locomotion Ambulation   Ambulation assist   Ambulation activity did not occur: Safety/medical concerns  Assist level: Contact Guard/Touching assist Assistive device: Walker-rolling Max distance: 15   Walk 10 feet activity   Assist  Walk 10 feet activity did not occur: Safety/medical concerns  Assist level: Contact Guard/Touching assist Assistive device: Walker-rolling   Walk 50 feet activity   Assist Walk 50  feet with 2 turns activity did not occur: Safety/medical concerns  Assist level: Contact Guard/Touching assist Assistive device: Walker-rolling    Walk 150 feet activity   Assist Walk 150 feet activity did not occur: Safety/medical concerns (fatigue)         Walk 10 feet on uneven surface  activity   Assist Walk 10 feet on uneven surfaces activity did not occur: Safety/medical concerns         Wheelchair     Assist Will patient use wheelchair at discharge?: Yes Type of Wheelchair: Manual Wheelchair activity did not occur: Safety/medical concerns         Wheelchair 50 feet with 2 turns activity    Assist    Wheelchair 50 feet with 2 turns activity did not occur: Safety/medical  concerns       Wheelchair 150 feet activity     Assist  Wheelchair 150 feet activity did not occur: Safety/medical concerns       Blood pressure 139/68, pulse 68, temperature 98.1 F (36.7 C), temperature source Oral, resp. rate 18, height 5\' 3"  (1.6 m), weight 57.6 kg, SpO2 100 %.  1.  Lumbar burst fracture s/p ORIF T11-L4- might be SCI with neurogenic bowel and bladder notable.              -ELOS/Goals: minA 2-3 weeks  -con't PT and OT- con't CIR 2.  Impaired mobility: -DVT/anticoagulation: Continue Lovenox 40 mg daily since renal function fine             -antiplatelet therapy: N/A 3. Pain Management: Oxycodone and robaxin prn.              --Lidocaine patches x3 added  for pain control             --Tylenol 650mg  TID scheduled.   -added scheduled oxycodone 5mg  daily 7:30am and 1:30pm   4/23 added 2.5mg  scheduled at night as well.     4/24 pain control much improved   4/25- says pain is still an issue- will move 7:30 am pain meds to 6:30 am   4/27- pain better controlled- con't regimen     Continue prn oxycodone at 2.5mg  q8H prn to limit neurosedation   -Obtained XR right wrist where pain was worst--negative.    - Apply ice. Pain improved with therapy and at rest.  4. Anxiety and depression: LCSW to follow for evaluation and support. Discussed with son her premorbid issues. Discussed starting Remeron for her anxiety and depression and son and husband agreeable. Start 7.5mg  4/20. Well tolerated, conitnue  4/26- doing MUCH better emotionally- con't regimen             -antipsychotic agents: N/A 5. Neuropsych: This patient may be intermittently capable of making decisions on her own behalf. 6. Skin/Wound Care: Routine pressure relief measures. May dc IV 7. Fluids/Electrolytes/Nutrition: Monitor I/O. Monitor lytes weekly. Eating much better with Remeron! 8. L2 burst fracture: Back precautions with brace when at EOB/OOB 9. Sternal fracture: Encourage IS.  Continue lidocaine  patch.  10. Neurogenic bladder: voiding well, incontinence, placed order for timed voiding q2H while awake.  11. Neurogenic bowel: Was incontinent during the night ( Movantik, miralax and senna taken this am).              --On colace, miralax, senna, movantik and daily suppository but laxative/suppositories have been refused/held multiple days.               --Will order KUB to monitor for  stool burden as family reports poor intake.              --will start with Senna S two in am and suppository after supper for bowel program.  4/12- cannot assess if pt is SCI pt- but is at high risk- will determine when can check strength exam.   4/23 moving bowels, formed stools.  4/25- less likely to have SCI since having regular stools without bowel program.   12. Acute blood loss anemia: H/H down to 9.2/94.9-->recheck in am.  13. Hyperglycemia: Likely due to stress--will order A1C for am.  14. Hypocalcemia: Ionized calcium 1.11.             --added calcium supplement.  15. Hypertension: systolic BP mildly elevated, monitor TID.  4/14- BP 120s/70s- con't regimen 15. Baseline: patient used to walk 1-2 miles per day with her husband. Was very active around her home.   16. Hypokalemia  4/12- K+ 3.0- repleted and will recheck next 2 days.  4/13- K+ 3.8 today- will recheck Friday  17. Confusion: improved with decreasing oxycodone.  18. Diarrhea: d/c dulcolax suppository.  4/27- has lomotil prn- not on any meds scheduled right now 19. Dysuria:  F/u ucx negative. Sx improved  4/25- pt reports still having burning intermittently- but last check showed (+) U/A but no growth- if continues, will resend  4/26- will start Pyridium 100 mg TID x 2 days and recheck U/A and Cx, just to make sure- not sure Cx last week was indicative of Sx's.   4/27- pyridium is helpful- due to (+) U/A- will try Keflex 500 mg BID and wait for Cx.    LOS: 16 days A FACE TO FACE EVALUATION WAS PERFORMED  Xia Stohr 03/14/2021,  7:57 AM

## 2021-03-14 NOTE — Progress Notes (Signed)
Physical Therapy Session Note  Patient Details  Name: Shelby Arellano MRN: [id number] Date of Birth: [birth year]  Today's Date: 03/14/2021 PT Individual Time: 1103-1200 PT Individual Time Calculation (min): 57 min   Short Term Goals: Week 2:  PT Short Term Goal 1 (Week 2): pt to demonstrate min A supine<>sit PT Short Term Goal 1 - Progress (Week 2): Met PT Short Term Goal 2 (Week 2): pt to demonstrate functional transfers with LRAD min A consistently PT Short Term Goal 2 - Progress (Week 2): Met PT Short Term Goal 3 (Week 2): pt to demonstrate 50' min A gait with LRAD PT Short Term Goal 3 - Progress (Week 2): Not met PT Short Term Goal 4 (Week 2): pt to tolerate standing at min A for at least 5 mins PT Short Term Goal 4 - Progress (Week 2): Not met  Skilled Therapeutic Interventions/Progress Updates: Pt presents sitting in [name], reclined back.  Pt agreeable to therapy, stating pain meds starting to wear off.  Pt states need to use toilet.  Pt transfers sit to stand w/ min A and amb w/ RW and min A to BR.  Pt requires assist for clothing and brief management and then transfers stand to sit w/ min A and verbal cues for hand placement.  Pt continent of urine and NT present.  Pt able to perform pericare in sitting and then max A for brief management.  Pt amb back to w/c and wheeled to gym for time conservation.  Pt performed LE there ex for increased strength, calf raises, LAQ and abd/add 3 x 15.  Pt performed multiple sit <> stand transfers w/ min A and occasional reminders for hand placement.  Pt performed toe taps to 3" platform 3 x 10, but stating need to sit 2/2 weakness.  Pt performed standing reaching forward and to side on table to stack/unstack cones using BUEs simultaneously, then elevated table to improve posture.  Pt returned to room and tilted [name] back for comfort.  Pt stating doing things not related to why she is here.  Explained reasoning for strengthening, balance, transfers, gait for safe  return to home and that "back therapy" will come later as able to perform more.  Spouse present.     Therapy Documentation Precautions:  Precautions Precautions: Back,Sternal,Fall Precaution Comments: brace when OOB Required Braces or Orthoses: Spinal Brace Spinal Brace: Thoracolumbosacral orthotic,Applied in sitting position Restrictions Weight Bearing Restrictions: No General:   Vital Signs:   Pain: not quantifying, staes increasing pain to back, pain meds wearing off.      Therapy/Group: Individual Therapy  Ladoris Gene 03/14/2021, 12:01 PM

## 2021-03-15 MED ORDER — HYDROCORTISONE 1 % EX CREA
TOPICAL_CREAM | Freq: Two times a day (BID) | CUTANEOUS | Status: DC
  Administered 2021-03-16: 1 via TOPICAL
  Filled 2021-03-15: qty 28

## 2021-03-15 NOTE — Progress Notes (Signed)
Physical Therapy Session Note  Patient Details  Name: Shelby Arellano MRN: [id number] Date of Birth: 1935/06/10  Today's Date: 03/15/2021 PT Individual Time: 1300-1330 PT Individual Time Calculation (min): 30 min   Short Term Goals: Week 3:  PT Short Term Goal 1 (Week 3): pt to demonstrate 50' min A gait with LRAD consistently PT Short Term Goal 2 (Week 3): pt to tolerate standing at min A for at least 5 mins PT Short Term Goal 3 (Week 3): pt to demonstrate bed mobility CGA PT Short Term Goal 4 (Week 3): pt to demonstrate functional transfers CGA with LRAD  Skilled Therapeutic Interventions/Progress Updates:    Pt received supine in bed and agreeable to PT. Supine>sit transfer with supevision assist and cues for UE placement and back precautions. PT assisted pt to don TLSO sitting EOB. Ambulatory transfer to toilet for urination. Pt able to void on toilet and perform peri care with supervision assist.   Pt transported to [facility] with RW and supervision assist for safety. Pt transported to rehab gym in [facility]. Gait training over level surface x 117f with supervision assist and cues for AD management in turns. Additional gait training up/down ramp with RW and supervision assist from PT for safety with cues for posture intermittently.   Pt returned to room and performed stand pivot transfer to bed with RW and supervision assist. Sit>supine completed with supervision assist with cues for back precautions initially. Pt left supine in bed with call bell in reach and all needs met.     Therapy Documentation Precautions:  Precautions Precautions: Back,Sternal,Fall Precaution Comments: brace when OOB Required Braces or Orthoses: Spinal Brace Spinal Brace: Thoracolumbosacral orthotic,Applied in sitting position Restrictions Weight Bearing Restrictions: No    Pain: Pain Assessment Pain Scale: 0-10 Pain Score: 0-No pain    Therapy/Group: Individual Therapy  ALorie Phenix4/28/2022, 1:36 PM

## 2021-03-15 NOTE — Progress Notes (Signed)
PROGRESS NOTE   Subjective/Complaints:  Pt reports wants to stop lidoderm- not helpful.  Bladder pain much better- little sting at the end of stream- only. Itching is awful around incision-  Husband asks to see Dahlia Client before appointment after lunch- because has pics of bathroom, etc- that [facility] asked for.   ROS:   Pt denies SOB, abd pain, CP, N/V/C/D, and vision changes   Objective:   No results found. No results for input(s): WBC, HGB, HCT, PLT in the last 72 hours. Recent Labs    03/13/21 0505  NA 138  K 3.4*  CL 106  CO2 24  GLUCOSE 90  BUN 10  CREATININE 0.85  CALCIUM 8.9    Intake/Output Summary (Last 24 hours) at 03/15/2021 0838 Last data filed at 03/15/2021 0806 Gross per 24 hour  Intake 840 ml  Output 200 ml  Net 640 ml        Physical Exam: Vital Signs Blood pressure 120/60, pulse 78, temperature 98.1 F (36.7 C), resp. rate 15, height 5\' 3"  (1.6 m), weight 57.6 kg, SpO2 98 %.      General: awake, alert, appropriate, sitting up in bed; brighter;  NAD HENT: conjugate gaze; oropharynx moist CV: regular rate; no JVD Pulmonary: CTA B/L; no W/R/R- good air movement GI: soft, NT, ND, (+)BS Psychiatric: appropriate; bright affect this AM Neurological: alert- poor memory 2nd time seen, husband at bedside;  Skin: back incision dressed- C/D/I- removed dressing- red/irritated around incision, likely from itching area- has steristrips- a LOT of them, in place- no drainage seen  sensation intact.  MMT 3-4/5 in LE, pain inhibition   Assessment/Plan: 1. Functional deficits which require 3+ hours per day of interdisciplinary therapy in a comprehensive inpatient rehab setting.  Physiatrist is providing close team supervision and 24 hour management of active medical problems listed below.  Physiatrist and rehab team continue to assess barriers to discharge/monitor patient progress toward functional  and medical goals  Care Tool:  Bathing    Body parts bathed by patient: Right arm,Left arm,Chest,Abdomen,Front perineal area,Right upper leg,Left upper leg,Right lower leg,Left lower leg,Face   Body parts bathed by helper: Buttocks     Bathing assist Assist Level: Minimal Assistance - Patient > 75%     Upper Body Dressing/Undressing Upper body dressing   What is the patient wearing?: Pull over shirt    Upper body assist Assist Level: Moderate Assistance - Patient 50 - 74% (note tighter shirt today needing more assist)    Lower Body Dressing/Undressing Lower body dressing      What is the patient wearing?: Incontinence brief     Lower body assist Assist for lower body dressing: Maximal Assistance - Patient 25 - 49%     Toileting Toileting    Toileting assist Assist for toileting: Maximal Assistance - Patient 25 - 49%     Transfers Chair/bed transfer  Transfers assist  Chair/bed transfer activity did not occur: Safety/medical concerns  Chair/bed transfer assist level: Minimal Assistance - Patient > 75%     Locomotion Ambulation   Ambulation assist   Ambulation activity did not occur: Safety/medical concerns  Assist level: Contact Guard/Touching assist Assistive device: Walker-rolling Max  distance: 130ft   Walk 10 feet activity   Assist  Walk 10 feet activity did not occur: Safety/medical concerns  Assist level: Contact Guard/Touching assist Assistive device: Walker-rolling   Walk 50 feet activity   Assist Walk 50 feet with 2 turns activity did not occur: Safety/medical concerns  Assist level: Contact Guard/Touching assist Assistive device: Walker-rolling    Walk 150 feet activity   Assist Walk 150 feet activity did not occur: Safety/medical concerns (fatigue)         Walk 10 feet on uneven surface  activity   Assist Walk 10 feet on uneven surfaces activity did not occur: Safety/medical concerns          Wheelchair     Assist Will patient use wheelchair at discharge?: Yes Type of Wheelchair: Manual Wheelchair activity did not occur: Safety/medical concerns         Wheelchair 50 feet with 2 turns activity    Assist    Wheelchair 50 feet with 2 turns activity did not occur: Safety/medical concerns       Wheelchair 150 feet activity     Assist  Wheelchair 150 feet activity did not occur: Safety/medical concerns       Blood pressure 120/60, pulse 78, temperature 98.1 F (36.7 C), resp. rate 15, height 5\' 3"  (1.6 m), weight 57.6 kg, SpO2 98 %.  1.  Lumbar burst fracture s/p ORIF T11-L4- B/B issues have resolved/improved.              -ELOS/Goals: minA 2-3 weeks  -con't PT and OT- husband has pics for OT- tracked down OT and let her know-  2.  Impaired mobility: -DVT/anticoagulation: Continue Lovenox 40 mg daily since renal function fine             -antiplatelet therapy: N/A 3. Pain Management: Oxycodone and robaxin prn.              --Lidocaine patches x3 added  for pain control             --Tylenol 650mg  TID scheduled.   -added scheduled oxycodone 5mg  daily 7:30am and 1:30pm   4/23 added 2.5mg  scheduled at night as well.     4/24 pain control much improved   4/25- says pain is still an issue- will move 7:30 am pain meds to 6:30 am   4/28- pt reports pain the best since got here- d/c lidoderm patches- not helpful- con't regimen     Continue prn oxycodone at 2.5mg  q8H prn to limit neurosedation   -Obtained XR right wrist where pain was worst--negative.    - Apply ice. Pain improved with therapy and at rest.  4. Anxiety and depression: LCSW to follow for evaluation and support. Discussed with son her premorbid issues. Discussed starting Remeron for her anxiety and depression and son and husband agreeable. Start 7.5mg  4/20. Well tolerated, conitnue  4/26- doing MUCH better emotionally- con't regimen             -antipsychotic agents: N/A 5. Neuropsych: This  patient may be intermittently capable of making decisions on her own behalf. 6. Skin/Wound Care: Routine pressure relief measures. May dc IV 7. Fluids/Electrolytes/Nutrition: Monitor I/O. Monitor lytes weekly. Eating much better with Remeron! 8. L2 burst fracture: Back precautions with brace when at EOB/OOB 9. Sternal fracture: Encourage IS.  Continue lidocaine patch.  10. Neurogenic bladder: voiding well, incontinence, placed order for timed voiding q2H while awake.  11. Neurogenic bowel: Was incontinent during the night ( Movantik,  miralax and senna taken this am).              --On colace, miralax, senna, movantik and daily suppository but laxative/suppositories have been refused/held multiple days.               --Will order KUB to monitor for stool burden as family reports poor intake.              --will start with Senna S two in am and suppository after supper for bowel program.  4/12- cannot assess if pt is SCI pt- but is at high risk- will determine when can check strength exam.   4/23 moving bowels, formed stools.     12. Acute blood loss anemia: H/H down to 9.2/94.9-->recheck in am.  13. Hyperglycemia: Likely due to stress--will order A1C for am.  14. Hypocalcemia: Ionized calcium 1.11.             --added calcium supplement.  15. Hypertension: systolic BP mildly elevated, monitor TID.  4/14- BP 120s/70s- con't regimen 15. Baseline: patient used to walk 1-2 miles per day with her husband. Was very active around her home.   16. Hypokalemia  4/12- K+ 3.0- repleted and will recheck next 2 days.  4/13- K+ 3.8 today- will recheck Friday  17. Confusion: improved with decreasing oxycodone.  18. Diarrhea: d/c dulcolax suppository.  4/27- has lomotil prn- not on any meds scheduled right now 19. Dysuria:  F/u ucx negative. Sx improved  4/25- pt reports still having burning intermittently- but last check showed (+) U/A but no growth- if continues, will resend  4/26- will start Pyridium  100 mg TID x 2 days and recheck U/A and Cx, just to make sure- not sure Cx last week was indicative of Sx's.   4/27- pyridium is helpful- due to (+) U/A- will try Keflex 500 mg BID and wait for Cx.   4/28- Cx so fat shows >100k GNRs- pending sensitivities.  20. Itching  4/28- will add cortisone cream around incision, not on incision and d/c dressing since has steristrips in place   LOS: 17 days A FACE TO FACE EVALUATION WAS PERFORMED  Keywon Mestre 03/15/2021, 8:38 AM

## 2021-03-15 NOTE — Progress Notes (Signed)
Occupational Therapy Weekly Progress Note  Patient Details  Name: Shelby Arellano MRN: [id number] Date of Birth: September 13, 1935  Beginning of progress report period: March 07, 2021 End of progress report period: March 15, 2021  Today's Date: 03/15/2021 OT Individual Time: 9093-1121  And 1401-1455 OT Individual Time Calculation (min): 25 min and 54 min   Patient has met 3 of 4 short term goals.  Pt has shown significant progress toward OT goals, ambulating with CGA and transferring to/from commode and shower same manner. Pt completes LB bathing with LHS Min A, dons shirt with Min/Mod and more assist with brace. Pt prefers to wear skirts that tie, which she can don with supervision d/t lack of needing to thread or bend. Pt's pain is still present but significantly improved. Training with pt's husband [name] is ongoing.   Patient continues to demonstrate the following deficits: muscle weakness, decreased cardiorespiratoy endurance, decreased coordination and decreased motor planning, decreased initiation, decreased attention, decreased awareness, decreased problem solving, decreased safety awareness and decreased memory and decreased sitting balance, decreased standing balance, decreased postural control and decreased balance strategies and therefore will continue to benefit from skilled OT intervention to enhance overall performance with BADL and Reduce care partner burden.  Patient progressing toward long term goals..  Continue plan of care.  OT Short Term Goals Week 2:  OT Short Term Goal 1 (Week 2): Pt will perform stand pivot transfer Min A of one caregiver OT Short Term Goal 1 - Progress (Week 2): Met OT Short Term Goal 2 (Week 2): Pt will consistently perform sit <> stands Min A to decrease caregiver burden OT Short Term Goal 2 - Progress (Week 2): Met OT Short Term Goal 3 (Week 2): Pt will thread LEs for LB dress with reacher no more than Mod A OT Short Term Goal 3 - Progress (Week 2):  Progressing toward goal OT Short Term Goal 4 (Week 2): Pt will perform UB ADLs with no more than Min A seated EOB or sink OT Short Term Goal 4 - Progress (Week 2): Met Week 3:  OT Short Term Goal 1 (Week 3): STGs = LTGs d/t ELOS   Skilled Therapeutic Interventions/Progress Updates:    Session 1: Pt greeted at time of session that was unplanned but agreeable to extra OT session, husband present and remained throughout to discuss home set up and bathroom layout. Extensive discussion with pt and husband regarding home layout, floor plan, bathroom set up for shower/sink/toilet, etc. Pt needing to toilet at end of session, ambulated chair > bathroom CGA with RW, husband providing and hand off to PT.   Session 2: Pt greeted at time of session supine in bed resting agreeable to OT session. Supine > sit from flat bed no rail with CGA/Min in prep for home, may purchase rail just in case. Donned brace with Mod/max A and walked room <> ADL aparmtent CGA. Practiced stepping in/out of walk in shower posterior entry method able to clear BLEs with CGA, cues for sequencing and problem solving but good skill performance. Walked back to room same manner, doffed brace sitting Mod/Max A and sit > supine CGA. Alarm on call bell in reach.   Therapy Documentation Precautions:  Precautions Precautions: Back,Sternal,Fall Precaution Comments: brace when OOB Required Braces or Orthoses: Spinal Brace Spinal Brace: Thoracolumbosacral orthotic,Applied in sitting position Restrictions Weight Bearing Restrictions: No     Therapy/Group: Individual Therapy  Viona Gilmore 03/15/2021, 12:57 PM

## 2021-03-15 NOTE — Progress Notes (Signed)
Patient ID: Shelby Arellano, female   DOB: [birth year], 86 y.o.   MRN: [id number]   Rolling Walker ordered through [employer].  [place], [place] [phone]

## 2021-03-15 NOTE — Progress Notes (Signed)
Physical Therapy Session Note  Patient Details  Name: Shelby Arellano MRN: [id number] Date of Birth: 11/01/1935  Today's Date: 03/15/2021 PT Individual Time: 3016-0109 and 1000-1100 PT Individual Time Calculation (min): 30 min  And 60 mins  Short Term Goals: Week 1:  PT Short Term Goal 1 (Week 1): pt to demonstrate supine<>sit mod A of one PT Short Term Goal 1 - Progress (Week 1): Met PT Short Term Goal 2 (Week 1): pt to demonstrate functional transfers with LRAD mod A x1 PT Short Term Goal 2 - Progress (Week 1): Met PT Short Term Goal 3 (Week 1): to initate gait with LRAD at least 25' mod A PT Short Term Goal 3 - Progress (Week 1): Not met PT Short Term Goal 4 (Week 1): pt to tolerate sitting OOB 1 hour PT Short Term Goal 4 - Progress (Week 1): Met Week 2:  PT Short Term Goal 1 (Week 2): pt to demonstrate min A supine<>sit PT Short Term Goal 1 - Progress (Week 2): Met PT Short Term Goal 2 (Week 2): pt to demonstrate functional transfers with LRAD min A consistently PT Short Term Goal 2 - Progress (Week 2): Met PT Short Term Goal 3 (Week 2): pt to demonstrate 50' min A gait with LRAD PT Short Term Goal 3 - Progress (Week 2): Not met PT Short Term Goal 4 (Week 2): pt to tolerate standing at min A for at least 5 mins PT Short Term Goal 4 - Progress (Week 2): Not met Week 3:  PT Short Term Goal 1 (Week 3): pt to demonstrate 50' min A gait with LRAD consistently PT Short Term Goal 2 (Week 3): pt to tolerate standing at min A for at least 5 mins PT Short Term Goal 3 (Week 3): pt to demonstrate bed mobility CGA PT Short Term Goal 4 (Week 3): pt to demonstrate functional transfers CGA with LRAD  Skilled Therapeutic Interventions/Progress Updates:    session 1: pt received in bed and agreeable to therapy. Husband present and had taken photos of their home and setup. Pt's husband and pt discussed with PT any questions and concerns about home setup, PT educated on safety and technique with  mobility in home, supervision with mobility, equipment recommendations at this time with need of Rolling walker, BSC, and potential manual WC. Pt and husband agreeable to all and denied further concerns or questions at this time. PT also educated pt and husband on goals to go home with increased I and decreased reliance on bed rails for bed mobility, progressing with car transfers, gait, and transfers and to progress out of [name] WC. Both parties agreeable. Pt directed in log rolling to pt's L with CGA and light use of bed rails. Pt directed in donning TLSO total A to complete. Pt directed in Sit to stand to Rolling walker CGA and reported she needed to use restroom. Pt directed in gait 10' to toilet CGA with Rolling walker and CGA for transfers. Pt (+)bladder void and able to complete pericare. Pt then directed in gait to [name] WC 10' CGA, donned skirt total A. Pt then directed in transfer to [facility]. Pt left in WC, All needs in reach and in good condition. Call light in hand.  And alarm set, husband present.  Session 2: pt received handoff from OT in restroom with pt and pt's husband. Pt completed BM at toilet, husband assisted total A for pericare. Pt then directed in ambulation to [name] [facility] with husband assisting CGA  with Rolling walker, 10'. Pt requested pain medication from nursing at this time. Pt taken to ortho gym total A for time in [place]. Pt directed in car transfer with Rolling walker for support min A in and out of car with VC for technique and safety and husband present for training as well. Pt then directed in gait ascending/descending  Ramp with Rolling walker CGA with poor cadence but no LOB or sway. Pt setup to exchange [name] WC for manual standard WC as pt is currently tolerating OOB positioning and WC mobility much better. Pt agreeable to attempting and reported she liked this chair better than [name] and reports she would prefer this as well. Pt and husband requested to finish session outside. Nursing aware and  agreeable. Pt taken on and off unit in [facility] total A for time and energy. Once outside, pt directed in gait training on concrete surface with various grades of incline and decline for 130' with Rolling walker min A-CGA. Pt required prolonged rest break after this on bench with ability to safely sit and stand from this demonstrated. Pt returned to [facility] CGA with Rolling walker and returned to unit. Pt requested to return to bed at end of session and directed in Stand pivot transfer to bedside with Hand held assist min A and min A without bedrails to transfer BLE into bed. Pt left in supine, All needs in reach and in good condition. Call light in hand.  And alarm set. Husband present throughout. TLSO doffed at bedside total A and husband educated on brace setup and removal.   Therapy Documentation Precautions:  Precautions Precautions: Back,Sternal,Fall Precaution Comments: brace when OOB Required Braces or Orthoses: Spinal Brace Spinal Brace: Thoracolumbosacral orthotic,Applied in sitting position Restrictions Weight Bearing Restrictions: No General:   Vital Signs:   Pain: Pain Assessment Pain Scale: 0-10 Pain Score: 0-No pain Mobility:   Locomotion :    Trunk/Postural Assessment :    Balance:   Exercises:   Other Treatments:      Therapy/Group: Individual Therapy  [name] 03/15/2021, 1:23 PM

## 2021-03-16 ENCOUNTER — Inpatient Hospital Stay (HOSPITAL_COMMUNITY): Payer: Medicare Other

## 2021-03-16 LAB — URINE CULTURE: Culture: >=100000 — AB

## 2021-03-16 MED ORDER — POTASSIUM CHLORIDE CRYS ER 20 MEQ PO TBCR
40.0000 meq | EXTENDED_RELEASE_TABLET | Freq: Two times a day (BID) | ORAL | Status: AC
  Administered 2021-03-16 (×2): 40 meq via ORAL
  Filled 2021-03-16 (×2): qty 2

## 2021-03-16 NOTE — Progress Notes (Signed)
PROGRESS NOTE   Subjective/Complaints:  Pt reports slept the best she has since admission- last night.  Good night.  Bowels gradually getting firmer- urine still stings at the end, but much better. Urine Cx still pending.   ROS:   Pt denies SOB, abd pain, CP, N/V/C/D, and vision changes   Objective:   No results found. No results for input(s): WBC, HGB, HCT, PLT in the last 72 hours. No results for input(s): NA, K, CL, CO2, GLUCOSE, BUN, CREATININE, CALCIUM in the last 72 hours.  Intake/Output Summary (Last 24 hours) at 03/16/2021 0834 Last data filed at 03/15/2021 1825 Gross per 24 hour  Intake 480 ml  Output --  Net 480 ml        Physical Exam: Vital Signs Blood pressure (!) 117/55, pulse 70, temperature 98.1 F (36.7 C), resp. rate 18, height 5\' 3"  (1.6 m), weight 57.6 kg, SpO2 100 %.       General: awake, alert, appropriate, sitting up slightly in bed; appropriate, NAD HENT: conjugate gaze; oropharynx moist CV: regular rate; no JVD Pulmonary: CTA B/L; no W/R/R- good air movement GI: soft, NT, ND, (+)BS- hypoactive Psychiatric: appropriate; occ tangential Neurological: alert- decreased memory Skin: back incision dressed- C/D/I- removed dressing- red/irritated around incision, likely from itching area- has steristrips- a LOT of them, in place- no drainage seen- not assessed today  sensation intact.  MMT 3-4/5 in LE, pain inhibition   Assessment/Plan: 1. Functional deficits which require 3+ hours per day of interdisciplinary therapy in a comprehensive inpatient rehab setting.  Physiatrist is providing close team supervision and 24 hour management of active medical problems listed below.  Physiatrist and rehab team continue to assess barriers to discharge/monitor patient progress toward functional and medical goals  Care Tool:  Bathing    Body parts bathed by patient: Right arm,Left  arm,Chest,Abdomen,Front perineal area,Right upper leg,Left upper leg,Right lower leg,Left lower leg,Face   Body parts bathed by helper: Buttocks     Bathing assist Assist Level: Minimal Assistance - Patient > 75%     Upper Body Dressing/Undressing Upper body dressing   What is the patient wearing?: Pull over shirt    Upper body assist Assist Level: Moderate Assistance - Patient 50 - 74% (note tighter shirt today needing more assist)    Lower Body Dressing/Undressing Lower body dressing      What is the patient wearing?: Incontinence brief     Lower body assist Assist for lower body dressing: Maximal Assistance - Patient 25 - 49%     Toileting Toileting    Toileting assist Assist for toileting: Maximal Assistance - Patient 25 - 49%     Transfers Chair/bed transfer  Transfers assist  Chair/bed transfer activity did not occur: Safety/medical concerns  Chair/bed transfer assist level: Minimal Assistance - Patient > 75%     Locomotion Ambulation   Ambulation assist   Ambulation activity did not occur: Safety/medical concerns  Assist level: Contact Guard/Touching assist Assistive device: Walker-rolling Max distance: 143ft   Walk 10 feet activity   Assist  Walk 10 feet activity did not occur: Safety/medical concerns  Assist level: Contact Guard/Touching assist Assistive device: Walker-rolling   Walk 50  feet activity   Assist Walk 50 feet with 2 turns activity did not occur: Safety/medical concerns  Assist level: Contact Guard/Touching assist Assistive device: Walker-rolling    Walk 150 feet activity   Assist Walk 150 feet activity did not occur: Safety/medical concerns (fatigue)         Walk 10 feet on uneven surface  activity   Assist Walk 10 feet on uneven surfaces activity did not occur: Safety/medical concerns         Wheelchair     Assist Will patient use wheelchair at discharge?: Yes Type of Wheelchair: Manual Wheelchair  activity did not occur: Safety/medical concerns         Wheelchair 50 feet with 2 turns activity    Assist    Wheelchair 50 feet with 2 turns activity did not occur: Safety/medical concerns       Wheelchair 150 feet activity     Assist  Wheelchair 150 feet activity did not occur: Safety/medical concerns       Blood pressure (!) 117/55, pulse 70, temperature 98.1 F (36.7 C), resp. rate 18, height 5\' 3"  (1.6 m), weight 57.6 kg, SpO2 100 %.  1.  Lumbar burst fracture s/p ORIF T11-L4- B/B issues have resolved/improved.              -ELOS/Goals: minA 2-3 weeks  -con't PT and OT- was able to show bathroom pics to OT 2.  Impaired mobility: -DVT/anticoagulation: Continue Lovenox 40 mg daily since renal function fine             -antiplatelet therapy: N/A 3. Pain Management: Oxycodone and robaxin prn.              --Lidocaine patches x3 added  for pain control             --Tylenol 650mg  TID scheduled.   -added scheduled oxycodone 5mg  daily 7:30am and 1:30pm   4/23 added 2.5mg  scheduled at night as well.     4/24 pain control much improved   4/25- says pain is still an issue- will move 7:30 am pain meds to 6:30 am   4/29- pain controlled- doesn't want to change regimen at all- con't regimen     Continue prn oxycodone at 2.5mg  q8H prn to limit neurosedation   -Obtained XR right wrist where pain was worst--negative.    - Apply ice. Pain improved with therapy and at rest.  4. Anxiety and depression: LCSW to follow for evaluation and support. Discussed with son her premorbid issues. Discussed starting Remeron for her anxiety and depression and son and husband agreeable. Start 7.5mg  4/20. Well tolerated, conitnue  4/29- bright affect; eating better- sleeping great- con't Remeron             -antipsychotic agents: N/A 5. Neuropsych: This patient may be intermittently capable of making decisions on her own behalf. 6. Skin/Wound Care: Routine pressure relief measures. May dc  IV 7. Fluids/Electrolytes/Nutrition: Monitor I/O. Monitor lytes weekly. Eating much better with Remeron! 8. L2 burst fracture: Back precautions with brace when at EOB/OOB 9. Sternal fracture: Encourage IS.  Continue lidocaine patch.  4/29- stopped lidoderm- didn't think was helpful 10. Neurogenic bladder: voiding well, incontinence, placed order for timed voiding q2H while awake.   4/29- emptying bladder- con't regimen 11. Neurogenic bowel: Was incontinent during the night ( Movantik, miralax and senna taken this am).              --On colace, miralax, senna, movantik and daily suppository  but laxative/suppositories have been refused/held multiple days.               --Will order KUB to monitor for stool burden as family reports poor intake.              --will start with Senna S two in am and suppository after supper for bowel program.  4/12- cannot assess if pt is SCI pt- but is at high risk- will determine when can check strength exam.   4/23 moving bowels, formed stools.  4/29- gradually getting more formed stools- con't Lomotil prn 12. Acute blood loss anemia: H/H down to 9.2/94.9-->recheck in am.  13. Hyperglycemia: Likely due to stress--will order A1C for am.  14. Hypocalcemia: Ionized calcium 1.11.             --added calcium supplement.  15. Hypertension: systolic BP mildly elevated, monitor TID.  4/14- BP 120s/70s- con't regimen 15. Baseline: patient used to walk 1-2 miles per day with her husband. Was very active around her home.   16. Hypokalemia  4/12- K+ 3.0- repleted and will recheck next 2 days.  4/13- K+ 3.8 today- will recheck Friday  4/29- Last K+ was 3.4- will replete and recheck Monday  17. Confusion: improved with decreasing oxycodone.  18. Diarrhea: d/c dulcolax suppository.  4/27- has lomotil prn- not on any meds scheduled right now 19. Dysuria:  F/u ucx negative. Sx improved  4/25- pt reports still having burning intermittently- but last check showed (+) U/A but  no growth- if continues, will resend  4/26- will start Pyridium 100 mg TID x 2 days and recheck U/A and Cx, just to make sure- not sure Cx last week was indicative of Sx's.   4/27- pyridium is helpful- due to (+) U/A- will try Keflex 500 mg BID and wait for Cx.   4/28- Cx so fat shows >100k GNRs- pending sensitivities.  20. Itching  4/28- will add cortisone cream around incision, not on incision and d/c dressing since has steristrips in place  4/29- still itching, but better- con't regimen   LOS: 18 days A FACE TO FACE EVALUATION WAS PERFORMED  Shelby Arellano 03/16/2021, 8:34 AM

## 2021-03-16 NOTE — Progress Notes (Signed)
Physical Therapy Session Note  Patient Details  Name: Shelby Arellano MRN: [id number] Date of Birth: 09/03/1935  Today's Date: 03/16/2021 PT Individual Time: 8315-1761 PT Individual Time Calculation (min): 59 min   Short Term Goals: Week 1:  PT Short Term Goal 1 (Week 1): pt to demonstrate supine<>sit mod A of one PT Short Term Goal 1 - Progress (Week 1): Met PT Short Term Goal 2 (Week 1): pt to demonstrate functional transfers with LRAD mod A x1 PT Short Term Goal 2 - Progress (Week 1): Met PT Short Term Goal 3 (Week 1): to initate gait with LRAD at least 25' mod A PT Short Term Goal 3 - Progress (Week 1): Not met PT Short Term Goal 4 (Week 1): pt to tolerate sitting OOB 1 hour PT Short Term Goal 4 - Progress (Week 1): Met Week 2:  PT Short Term Goal 1 (Week 2): pt to demonstrate min A supine<>sit PT Short Term Goal 1 - Progress (Week 2): Met PT Short Term Goal 2 (Week 2): pt to demonstrate functional transfers with LRAD min A consistently PT Short Term Goal 2 - Progress (Week 2): Met PT Short Term Goal 3 (Week 2): pt to demonstrate 50' min A gait with LRAD PT Short Term Goal 3 - Progress (Week 2): Not met PT Short Term Goal 4 (Week 2): pt to tolerate standing at min A for at least 5 mins PT Short Term Goal 4 - Progress (Week 2): Not met Week 3:  PT Short Term Goal 1 (Week 3): pt to demonstrate 50' min A gait with LRAD consistently PT Short Term Goal 2 (Week 3): pt to tolerate standing at min A for at least 5 mins PT Short Term Goal 3 (Week 3): pt to demonstrate bed mobility CGA PT Short Term Goal 4 (Week 3): pt to demonstrate functional transfers CGA with LRAD  Skilled Therapeutic Interventions/Progress Updates:    Pain:  Pt reports "no different than usual" pain but appears annoyed when asked to rate numerically.  Treatment to tolerance.  Rest breaks and repositioning as needed.  Pt initially supine and agreeable to treatment session w/focus on functional mobility, balance, global  strength.  Husband at bedside, not wearing mask, states visitors do not need them in room anymore.  Therapist stated she would followup w/nursing to confirm, husband wearing mask when therapist returned to room.   Supine to sit w/good adherence to precautions w/supervison TLSO donned by therapist. Sit to stand to RW w/supervision, dons wraparound skirt w/set up, supervision, no UE support. stand pivot transfer to wc w/RW, supervision  Once in wc, pt transported to gym for continued session.   Gait 171ft including 4 turns, 3 x backing x 3 steps, standing overhead reach and crossbody reaching activity all w/supervision w/RW.  During reaching therapist used example of reaching overhead into cabinet "as if stacking dishes from counter.  Pt became aggravated stating "I dont do that, we don't even have a dishwasher".  When changed example to placing clean clothes on a shelf pt states "this is silly" but continues w/task.  Standing overhead press w/1lb bar for balance challenge x10  Standing on foam x 5 min w/progess from bilat UE support on RW, single UE support (mild increase sway), no UE support (increase sway, cga)  Standing w/RW altnernating tapping 4in step x 12 each w/cga Sidestepping in parallel bars 34ft x 6 w/bilat UE support, cga  Repeated x 2 w/seated rest between efforts. When therapist askes pt if  she needs break, pt responds "I don't know, arent you supposed to know that?"  Explained question was just to determine if she was feeling overtired or in pain, pt responds "that is up to you, not up to me" and  "you don't have enough respect to even tell me what to do."  Assured pt that no disrespect was intended.  Repeated Sit to stand from wc to RW w/cga x 5 When asked if she wanted to return to bed pt argues, I don't know what I have to do next.  How can I know what I am supposed to do.  When schedule retrieved and pt informed that I was her last scheduled sessio for the day, pt states "I know  that".    Pt left oob in wc w/alarm belt set and needs in reach.  Pt relays to husband that she did "little walking and a bunch of silly things that were supposedly exercises".  Therapist politely ended session.    Therapy Documentation Precautions:  Precautions Precautions: Back,Sternal,Fall Precaution Comments: brace when OOB Required Braces or Orthoses: Spinal Brace Spinal Brace: Thoracolumbosacral orthotic,Applied in sitting position Restrictions Weight Bearing Restrictions: No    Therapy/Group: Individual Therapy Callie Fielding, [place] 03/16/2021, 3:48 PM

## 2021-03-16 NOTE — Progress Notes (Signed)
Physical Therapy Session Note  Patient Details  Name: Shelby Arellano MRN: [id number] Date of Birth: Jan 30, 1935  Today's Date: 03/16/2021 PT Individual Time: 0900-1000 PT Individual Time Calculation (min): 60 min   Short Term Goals: Week 1:  PT Short Term Goal 1 (Week 1): pt to demonstrate supine<>sit mod A of one PT Short Term Goal 1 - Progress (Week 1): Met PT Short Term Goal 2 (Week 1): pt to demonstrate functional transfers with LRAD mod A x1 PT Short Term Goal 2 - Progress (Week 1): Met PT Short Term Goal 3 (Week 1): to initate gait with LRAD at least 25' mod A PT Short Term Goal 3 - Progress (Week 1): Not met PT Short Term Goal 4 (Week 1): pt to tolerate sitting OOB 1 hour PT Short Term Goal 4 - Progress (Week 1): Met Week 2:  PT Short Term Goal 1 (Week 2): pt to demonstrate min A supine<>sit PT Short Term Goal 1 - Progress (Week 2): Met PT Short Term Goal 2 (Week 2): pt to demonstrate functional transfers with LRAD min A consistently PT Short Term Goal 2 - Progress (Week 2): Met PT Short Term Goal 3 (Week 2): pt to demonstrate 50' min A gait with LRAD PT Short Term Goal 3 - Progress (Week 2): Not met PT Short Term Goal 4 (Week 2): pt to tolerate standing at min A for at least 5 mins PT Short Term Goal 4 - Progress (Week 2): Not met Week 3:  PT Short Term Goal 1 (Week 3): pt to demonstrate 50' min A gait with LRAD consistently PT Short Term Goal 2 (Week 3): pt to tolerate standing at min A for at least 5 mins PT Short Term Goal 3 (Week 3): pt to demonstrate bed mobility CGA PT Short Term Goal 4 (Week 3): pt to demonstrate functional transfers CGA with LRAD  Skilled Therapeutic Interventions/Progress Updates:    pt received in bed and agreeable to therapy. Son present. Pt directed in supine>sit CGA no rail used. Pt directed in donning TLSO at bedside max A. Pt directed in gait to bathroom CGA with Rolling walker and (+) bladder void CGA for pericare. Pt directed in gait 200' with  Rolling walker CGA with VC for posture. Pt required rest break then directed in additional 15' and 25' with Rolling walker CGA limited 2/2 fatigue. Pt's son had equipment and car transfer questions, he was updated on all recommendations and needs at this time. Pt returned to room requested to remain in [facility] at end of session with family present. Pt left as such, All needs in reach and in good condition. Call light in hand.   Therapy Documentation Precautions:  Precautions Precautions: Back,Sternal,Fall Precaution Comments: brace when OOB Required Braces or Orthoses: Spinal Brace Spinal Brace: Thoracolumbosacral orthotic,Applied in sitting position Restrictions Weight Bearing Restrictions: No General:   Vital Signs:   Pain:   Mobility:   Locomotion :    Trunk/Postural Assessment :    Balance:   Exercises:   Other Treatments:      Therapy/Group: Individual Therapy  [name] 03/16/2021, 12:59 PM

## 2021-03-16 NOTE — Plan of Care (Signed)
  Problem: Consults Goal: RH SPINAL CORD INJURY PATIENT EDUCATION Description:  See Patient Education module for education specifics.  Outcome: Progressing Goal: Skin Care Protocol Initiated - if Braden Score 18 or less Description: If consults are not indicated, leave blank or document N/A Outcome: Progressing   Problem: RH SKIN INTEGRITY Goal: RH STG MAINTAIN SKIN INTEGRITY WITH ASSISTANCE Description: STG Maintain Skin Integrity With min Assistance. Outcome: Progressing Goal: RH STG ABLE TO PERFORM INCISION/WOUND CARE W/ASSISTANCE Description: STG Able To Perform Incision/Wound Care With min Assistance. Outcome: Progressing   Problem: RH SAFETY Goal: RH STG ADHERE TO SAFETY PRECAUTIONS W/ASSISTANCE/DEVICE Description: STG Adhere to Safety Precautions With min Assistance/Device. Outcome: Progressing Goal: RH STG DECREASED RISK OF FALL WITH ASSISTANCE Description: STG Decreased Risk of Fall With min Assistance. Outcome: Progressing   Problem: RH PAIN MANAGEMENT Goal: RH STG PAIN MANAGED AT OR BELOW PT'S PAIN GOAL Description: <4 on a 0-10 pain scale Outcome: Progressing   Problem: RH KNOWLEDGE DEFICIT SCI Goal: RH STG INCREASE KNOWLEDGE OF SELF CARE AFTER SCI Description: Patient will demonstrate knowledge of medication management, pain management, skin/wound care, and weight bearing precautions with educational materials and handouts provided by staff, at discharge independently. Outcome: Progressing

## 2021-03-16 NOTE — Progress Notes (Signed)
Occupational Therapy Session Note  Patient Details  Name: GAE BIHL MRN: [id number] Date of Birth: [birth year]  Today's Date: 03/16/2021 OT Individual Time: 1001-1101 OT Individual Time Calculation (min): 60 min    Skilled Therapeutic Interventions/Progress Updates:    Pt greeted in the w/c, pain reportedly manageable for tx. Her son, Jorja Loa, reported that pt had just ambulated over 200 ft with PT! Pt agreeable to shower today, stating "that would feel nice." CGA for ambulatory transfer to TTB using RW, pt requires vcs for hand placement during sit<stand transitions. She bathed while seated, utilizing lateral leans for perihygiene and LH sponge for washing feet. Pt did well with back precaution adherence given min cues. Note that back incision was covered prior to bathing per MD order however pt appeared to have dressings changed from honeycomb to steri strips this week. UB dressing completed with assistance for TLSO only. Pt wanted to only have brief on only vs also don skirt. She ambulated back to the w/c and completed hair care with setup assistance and items placed to minimize reaching/twisting demands. She returned to bed using RW at end of session, using logroll to transition back to flat bed without bedrail per home setup. Pt remained in bed at close of session, all needs within reach and bed alarm set. Tx focus placed on adaptive self care skills, functional transfers, and back precaution adherence during functional activity.  Therapy Documentation Precautions:  Precautions Precautions: Back,Sternal,Fall Precaution Comments: brace when OOB Required Braces or Orthoses: Spinal Brace Spinal Brace: Thoracolumbosacral orthotic,Applied in sitting position Restrictions Weight Bearing Restrictions: No ADL: ADL Grooming: Minimal assistance Where Assessed-Grooming: Edge of bed Upper Body Bathing: Dependent Where Assessed-Upper Body Bathing: Edge of bed Lower Body Bathing: Dependent  (+2) Where Assessed-Lower Body Bathing: Edge of bed Upper Body Dressing: Dependent Where Assessed-Upper Body Dressing: Edge of bed      Therapy/Group: Individual Therapy  Mikhia Dusek A Ireoluwa Gorsline 03/16/2021, 3:19 PM

## 2021-03-16 NOTE — Progress Notes (Signed)
Patient and husband present with some aggitation related to lap belt while in wheelchair. Patient and husband educated to the fact that the lap belt is for the safety of the patient. Husband of patient stated how is he supposed to take his wife to the bathroom if she has the belt on. Patient and husband explained that when she needed to toilet that she should ring the bell and staff would be in to remove the belt so that she can toilet. Will consult with therapy and nursing if lap belt can be removed. Cletis Media, LPN

## 2021-03-17 NOTE — Progress Notes (Signed)
Physical Therapy Session Note  Patient Details  Name: Shelby Arellano MRN: [id number] Date of Birth: 26-Mar-1935  Today's Date: 03/17/2021 PT Individual Time: 0908-1008 PT Individual Time Calculation (min): 60 min   Short Term Goals: Week 3:  PT Short Term Goal 1 (Week 3): pt to demonstrate 50' min A gait with LRAD consistently PT Short Term Goal 2 (Week 3): pt to tolerate standing at min A for at least 5 mins PT Short Term Goal 3 (Week 3): pt to demonstrate bed mobility CGA PT Short Term Goal 4 (Week 3): pt to demonstrate functional transfers CGA with LRAD  Skilled Therapeutic Interventions/Progress Updates:    Pt received supine in bed with her husband, Clifton Custard, present and pt agreeable to therapy session. Supine>sitting R EOB, HOB flat and not using bedrail to simulate home environment, with step-by-step cuing for sequencing of logroll technique - CGA for steadying during trunk upright. Sitting EOB donned TLSO brace total assist (pt's husband reporting he has been learning how to do this) and pt reports having the straps under her arms improves fit of TLSO. Sit<>stands using RW or R HHA with CGA for safety throughout session. Gait in/out of bathroom using RW with CGA for safety - continues to demo slow gait speed. Sit<>stand BSC over toilet<>RW with CGA progressing to supervision for safety. Continent of bladder and bowels - completed anterior pericare without assist and posterior peri-care with total assist. Standing at sink with supervision performed hand hygiene. Gait training ~149ft to main therapy gym using RW with CGA for safety - repeated cuing for increased glute activation to promote increased hip extension and upright posture - continues to demo slow gait speed with decreased B LE step lengths and foot clearances along with narrow BOS. Gait training ~28ft with R HHA to promote increased upright posture with improvements noted. Standing repeated R/L LE foot taps on/off 6" step 2x10 reps each LE  with mirror feedback focusing on sustained hip extensor/glute activation to maintain upright posture during this task - R HHA min assist for balance. Gait training ~147ft R HHA with min assist for balance - cuing for carryover of increased gute activation and hip extension while focusing on increased B LE foot clearance and increased gait speed - improvement noted. Dynamic gait and hip abductor strengthening via R/L lateral side stepping in // bars for B UE support through agility ladder with 1.5lb ankle weights donned - CGA for safety. Gait training ~184ft back to room using RW with CGA - cuing for carryover of improved gait mechanics but difficult for pt to sustain. Pt request to return to bed. Sitting EOB doffed TLSO total assist. Sit>supine via reverse logroll technique with sequential cuing and min assist for B LE management. Pt left supine in bed with needs in reach and her husband present.  Therapy Documentation Precautions:  Precautions Precautions: Back,Sternal,Fall Precaution Comments: brace when OOB Required Braces or Orthoses: Spinal Brace Spinal Brace: Thoracolumbosacral orthotic,Applied in sitting position Restrictions Weight Bearing Restrictions: No  Pain:   Reports "I'm okay" when questioned but otherwise no complaints of pain.   Therapy/Group: Individual Therapy  Ginny Forth , PT, DPT, CSRS  03/17/2021, 7:50 AM

## 2021-03-17 NOTE — Plan of Care (Signed)
  Problem: Consults Goal: RH SPINAL CORD INJURY PATIENT EDUCATION Description:  See Patient Education module for education specifics.  Outcome: Progressing Goal: Skin Care Protocol Initiated - if Braden Score 18 or less Description: If consults are not indicated, leave blank or document N/A Outcome: Progressing   Problem: RH SKIN INTEGRITY Goal: RH STG MAINTAIN SKIN INTEGRITY WITH ASSISTANCE Description: STG Maintain Skin Integrity With min Assistance. Outcome: Progressing Goal: RH STG ABLE TO PERFORM INCISION/WOUND CARE W/ASSISTANCE Description: STG Able To Perform Incision/Wound Care With min Assistance. Outcome: Progressing   Problem: RH SAFETY Goal: RH STG ADHERE TO SAFETY PRECAUTIONS W/ASSISTANCE/DEVICE Description: STG Adhere to Safety Precautions With min Assistance/Device. Outcome: Progressing Goal: RH STG DECREASED RISK OF FALL WITH ASSISTANCE Description: STG Decreased Risk of Fall With min Assistance. Outcome: Progressing   Problem: RH PAIN MANAGEMENT Goal: RH STG PAIN MANAGED AT OR BELOW PT'S PAIN GOAL Description: <4 on a 0-10 pain scale Outcome: Progressing   Problem: RH KNOWLEDGE DEFICIT SCI Goal: RH STG INCREASE KNOWLEDGE OF SELF CARE AFTER SCI Description: Patient will demonstrate knowledge of medication management, pain management, skin/wound care, and weight bearing precautions with educational materials and handouts provided by staff, at discharge independently. Outcome: Progressing   

## 2021-03-17 NOTE — Progress Notes (Signed)
Occupational Therapy Session Note  Patient Details  Name: Shelby Arellano MRN: [id number] Date of Birth: Oct 26, 1935  Today's Date: 03/17/2021 OT Individual Time: 4174-0814 OT Individual Time Calculation (min): 61 min   Skilled Therapeutic Interventions/Progress Updates:    Pt greeted in bed, requesting to start session by using the restroom. Supine<sit from flat bed without bedrail using logroll technique completed with setup assistance! We celebrated! After OT donned TLSO, supervision-CGA for ambulatory transfer to the toilet using RW where pt lowered her brief with supervision. Pt with B+B void, able to complete hygiene in the front but needed help with the back. Toileting tasks completed at sit<stand level today with close supervision-CGA for dynamic standing balance! Note that pt completed toileting with B+B void x2 during session. While EOB, worked on increasing functional independence with LB dressing. Educated pt on use of sock aide with demonstration provided. After pt donned both gripper socks with cues, she was able to don them again using sock aide with setup assistance. Transitioned to focus on unsupported sitting balance and functional cognition. Pt knitted her shawl while EOB, dual task demands involving conversing with therapist while she knitted. Pt very pleased regarding her gains in cognition/hand function allowing her to return to this meaningful leisure occupation. At end of session pt wanted to return to bed, did so without assistance. Left her with all needs within reach and bed alarm set. Pt still working on her shawl.   Therapy Documentation Precautions:  Precautions Precautions: Back,Sternal,Fall Precaution Comments: brace when OOB Required Braces or Orthoses: Spinal Brace Spinal Brace: Thoracolumbosacral orthotic,Applied in sitting position Restrictions Weight Bearing Restrictions: No Pain: pt reported being premedicated for pain, used her personal notebook as reference  pertaining to time/medicine   ADL: ADL Grooming: Minimal assistance Where Assessed-Grooming: Edge of bed Upper Body Bathing: Dependent Where Assessed-Upper Body Bathing: Edge of bed Lower Body Bathing: Dependent (+2) Where Assessed-Lower Body Bathing: Edge of bed Upper Body Dressing: Dependent Where Assessed-Upper Body Dressing: Edge of bed      Therapy/Group: Individual Therapy  Noemi Bellissimo A Joslyn Ramos 03/17/2021, 3:31 PM

## 2021-03-17 NOTE — Progress Notes (Signed)
PROGRESS NOTE   Subjective/Complaints: No complaints this morning Walking so much better! Stable spinal DG yesterday Provided husband with list of foods that can help with pain.   ROS:   Pt denies SOB, abd pain, CP, N/V/C/D, and vision changes, +postoperative pain   Objective:   DG THORACOLUMABAR SPINE  Result Date: 03/16/2021 CLINICAL DATA:  Status post spinal fusion EXAM: THORACOLUMBAR SPINE 1V COMPARISON:  02/21/2021 FINDINGS: Compression fractures of the L1 and L2 vertebral bodies. Posterior spinal fusion from T11 through L4 with bilateral pedicle screws at each level without hardware failure or complication. Grade 1 anterolisthesis of L2 on L3 and L4 on L5. Generalized osteopenia. IMPRESSION: 1. Interval posterior spinal fusion from T11 through L4 with bilateral pedicle screws at each level without hardware failure or complication. Compression fractures of the L1 and L2 vertebral bodies. Electronically Signed   By: Elige Ko   On: 03/16/2021 21:16   No results for input(s): WBC, HGB, HCT, PLT in the last 72 hours. No results for input(s): NA, K, CL, CO2, GLUCOSE, BUN, CREATININE, CALCIUM in the last 72 hours.  Intake/Output Summary (Last 24 hours) at 03/17/2021 1406 Last data filed at 03/16/2021 1827 Gross per 24 hour  Intake 120 ml  Output --  Net 120 ml        Physical Exam: Vital Signs Blood pressure (!) 107/53, pulse 63, temperature 98.2 F (36.8 C), resp. rate 17, height 5\' 3"  (1.6 m), weight 57.6 kg, SpO2 99 %.   Gen: no distress, normal appearing HEENT: oral mucosa pink and moist, NCAT Cardio: Reg rate Chest: normal effort, normal rate of breathing Abd: soft, non-distended Ext: no edema Psych: pleasant, normal affect Skin: intact Neurological: alert- decreased memory Skin: back incision dressed- C/D/I- removed dressing- red/irritated around incision, likely from itching area- has steristrips- a LOT  of them, in place- no drainage seen- not assessed today  sensation intact.  MMT 3-4/5 in LE, pain inhibition   Assessment/Plan: 1. Functional deficits which require 3+ hours per day of interdisciplinary therapy in a comprehensive inpatient rehab setting.  Physiatrist is providing close team supervision and 24 hour management of active medical problems listed below.  Physiatrist and rehab team continue to assess barriers to discharge/monitor patient progress toward functional and medical goals  Care Tool:  Bathing    Body parts bathed by patient: Right arm,Left arm,Chest,Abdomen,Front perineal area,Right upper leg,Left upper leg,Right lower leg,Left lower leg,Face,Buttocks   Body parts bathed by helper: Buttocks     Bathing assist Assist Level: Contact Guard/Touching assist     Upper Body Dressing/Undressing Upper body dressing   What is the patient wearing?: Pull over shirt    Upper body assist Assist Level: Set up assist    Lower Body Dressing/Undressing Lower body dressing      What is the patient wearing?: Incontinence brief     Lower body assist Assist for lower body dressing: Maximal Assistance - Patient 25 - 49%     Toileting Toileting    Toileting assist Assist for toileting: Maximal Assistance - Patient 25 - 49%     Transfers Chair/bed transfer  Transfers assist  Chair/bed transfer activity did not occur: Safety/medical  concerns  Chair/bed transfer assist level: Minimal Assistance - Patient > 75%     Locomotion Ambulation   Ambulation assist   Ambulation activity did not occur: Safety/medical concerns  Assist level: Contact Guard/Touching assist Assistive device: Walker-rolling Max distance: 160ft   Walk 10 feet activity   Assist  Walk 10 feet activity did not occur: Safety/medical concerns  Assist level: Contact Guard/Touching assist Assistive device: Walker-rolling   Walk 50 feet activity   Assist Walk 50 feet with 2 turns  activity did not occur: Safety/medical concerns  Assist level: Contact Guard/Touching assist Assistive device: Walker-rolling    Walk 150 feet activity   Assist Walk 150 feet activity did not occur: Safety/medical concerns (fatigue)         Walk 10 feet on uneven surface  activity   Assist Walk 10 feet on uneven surfaces activity did not occur: Safety/medical concerns         Wheelchair     Assist Will patient use wheelchair at discharge?: Yes Type of Wheelchair: Manual Wheelchair activity did not occur: Safety/medical concerns         Wheelchair 50 feet with 2 turns activity    Assist    Wheelchair 50 feet with 2 turns activity did not occur: Safety/medical concerns       Wheelchair 150 feet activity     Assist  Wheelchair 150 feet activity did not occur: Safety/medical concerns       Blood pressure (!) 107/53, pulse 63, temperature 98.2 F (36.8 C), resp. rate 17, height 5\' 3"  (1.6 m), weight 57.6 kg, SpO2 99 %.  1.  Lumbar burst fracture s/p ORIF T11-L4- B/B issues have resolved/improved.              -ELOS/Goals: minA 2-3 weeks  -continue PT and OT- was able to show bathroom pics to OT 2.  Impaired mobility: -DVT/anticoagulation: Continue Lovenox 40 mg daily since renal function fine             -antiplatelet therapy: N/A 3. Pain Management: Oxycodone and robaxin prn.              --Lidocaine patches x3 added  for pain control             --Tylenol 650mg  TID scheduled.   -added scheduled oxycodone 5mg  daily 7:30am and 1:30pm   4/23 added 2.5mg  scheduled at night as well.     4/24 pain control much improved   4/25- says pain is still an issue- will move 7:30 am pain meds to 6:30 am   4/29- pain controlled- doesn't want to change regimen at all- con't regimen  4/30: provided with list of foods that can help with pain.      Continue prn oxycodone at 2.5mg  q8H prn to limit neurosedation   -Obtained XR right wrist where pain was  worst--negative.    - Apply ice. Pain improved with therapy and at rest.  4. Anxiety and depression: LCSW to follow for evaluation and support. Discussed with son her premorbid issues. Discussed starting Remeron for her anxiety and depression and son and husband agreeable. Start 7.5mg  4/20. Well tolerated, conitnue  4/30- bright affect; eating better- sleeping great- continue Remeron             -antipsychotic agents: N/A 5. Neuropsych: This patient may be intermittently capable of making decisions on her own behalf. 6. Skin/Wound Care: Routine pressure relief measures. May dc IV 7. Fluids/Electrolytes/Nutrition: Monitor I/O. Monitor lytes weekly. Eating much better  with Remeron! 8. L2 burst fracture: Back precautions with brace when at EOB/OOB 9. Sternal fracture: Encourage IS.  Continue lidocaine patch.  4/29- stopped lidoderm- didn't think was helpful 10. Neurogenic bladder: voiding well, incontinence, placed order for timed voiding q2H while awake.   4/29- emptying bladder- con't regimen 11. Neurogenic bowel: Was incontinent during the night ( Movantik, miralax and senna taken this am).              --On colace, miralax, senna, movantik and daily suppository but laxative/suppositories have been refused/held multiple days.               --Will order KUB to monitor for stool burden as family reports poor intake.              --will start with Senna S two in am and suppository after supper for bowel program.  4/12- cannot assess if pt is SCI pt- but is at high risk- will determine when can check strength exam.   4/23 moving bowels, formed stools.  4/30- gradually getting more formed stools- continue Lomotil prn 12. Acute blood loss anemia: H/H down to 9.2/94.9-->recheck in am.  13. Hyperglycemia: Likely due to stress--will order A1C for am.  14. Hypocalcemia: Ionized calcium 1.11.             --added calcium supplement.  15. Hypertension: systolic BP mildly elevated, monitor TID.  4/14- BP  120s/70s- con't regimen 15. Baseline: patient used to walk 1-2 miles per day with her husband. Was very active around her home.   16. Hypokalemia  4/12- K+ 3.0- repleted and will recheck next 2 days.  4/13- K+ 3.8 today- will recheck Friday  4/29- Last K+ was 3.4- will replete and recheck Monday  17. Confusion: improved with decreasing oxycodone.  18. Diarrhea: d/c dulcolax suppository.  4/27- has lomotil prn- not on any meds scheduled right now 19. Dysuria:  F/u ucx negative. Sx improved  4/25- pt reports still having burning intermittently- but last check showed (+) U/A but no growth- if continues, will resend  4/26- will start Pyridium 100 mg TID x 2 days and recheck U/A and Cx, just to make sure- not sure Cx last week was indicative of Sx's.   4/27- pyridium is helpful- due to (+) U/A- will try Keflex 500 mg BID and wait for Cx.   4/28- Cx so fat shows >100k GNRs- pending sensitivities.  20. Itching  4/28- will add cortisone cream around incision, not on incision and d/c dressing since has steristrips in place  4/29- still itching, but better- con't regimen   LOS: 19 days A FACE TO FACE EVALUATION WAS PERFORMED  Lerlene Treadwell P Shilo Pauwels 03/17/2021, 2:06 PM

## 2021-03-18 NOTE — Progress Notes (Signed)
PROGRESS NOTE   Subjective/Complaints: No complaints this morning Standing with RW about to work with [employer] Husband asks about home DME and [employer] advises regarding equipment she will need.   ROS:   Pt denies SOB, abd pain, CP, N/V/C/D, and vision changes, +postoperative pain   Objective:   DG THORACOLUMABAR SPINE  Result Date: 03/16/2021 CLINICAL DATA:  Status post spinal fusion EXAM: THORACOLUMBAR SPINE 1V COMPARISON:  02/21/2021 FINDINGS: Compression fractures of the L1 and L2 vertebral bodies. Posterior spinal fusion from T11 through L4 with bilateral pedicle screws at each level without hardware failure or complication. Grade 1 anterolisthesis of L2 on L3 and L4 on L5. Generalized osteopenia. IMPRESSION: 1. Interval posterior spinal fusion from T11 through L4 with bilateral pedicle screws at each level without hardware failure or complication. Compression fractures of the L1 and L2 vertebral bodies. Electronically Signed   By: Elige Ko   On: 03/16/2021 21:16   No results for input(s): WBC, HGB, HCT, PLT in the last 72 hours. No results for input(s): NA, K, CL, CO2, GLUCOSE, BUN, CREATININE, CALCIUM in the last 72 hours. No intake or output data in the 24 hours ending 03/18/21 1150      Physical Exam: Vital Signs Blood pressure (!) 117/57, pulse 78, temperature 98.6 F (37 C), temperature source Oral, resp. rate 20, height 5\' 3"  (1.6 m), weight 57.6 kg, SpO2 97 %. Gen: no distress, normal appearing HEENT: oral mucosa pink and moist, NCAT Cardio: Reg rate Chest: normal effort, normal rate of breathing Abd: soft, non-distended Ext: no edema Psych: pleasant, normal affect Skin: intact Neurological: alert- decreased memory Skin: back incision dressed- C/D/I- removed dressing- red/irritated around incision, likely from itching area- has steristrips- a LOT of them, in place- no drainage seen- not assessed today   sensation intact.  MMT 3-4/5 in LE, pain inhibition   Assessment/Plan: 1. Functional deficits which require 3+ hours per day of interdisciplinary therapy in a comprehensive inpatient rehab setting.  Physiatrist is providing close team supervision and 24 hour management of active medical problems listed below.  Physiatrist and rehab team continue to assess barriers to discharge/monitor patient progress toward functional and medical goals  Care Tool:  Bathing    Body parts bathed by patient: Right arm,Left arm,Chest,Abdomen,Front perineal area,Right upper leg,Left upper leg,Right lower leg,Left lower leg,Face,Buttocks   Body parts bathed by helper: Buttocks     Bathing assist Assist Level: Contact Guard/Touching assist     Upper Body Dressing/Undressing Upper body dressing   What is the patient wearing?: Pull over shirt    Upper body assist Assist Level: Set up assist    Lower Body Dressing/Undressing Lower body dressing      What is the patient wearing?: Skirt     Lower body assist Assist for lower body dressing: Minimal Assistance - Patient > 75%     Toileting Toileting    Toileting assist Assist for toileting: Minimal Assistance - Patient > 75%     Transfers Chair/bed transfer  Transfers assist  Chair/bed transfer activity did not occur: Safety/medical concerns  Chair/bed transfer assist level: Contact Guard/Touching assist Chair/bed transfer assistive device:  Ambulation assist   Ambulation activity did not occur: Safety/medical concerns  Assist level: Contact Guard/Touching assist Assistive device: Walker-rolling Max distance: 154ft   Walk 10 feet activity   Assist  Walk 10 feet activity did not occur: Safety/medical concerns  Assist level: Contact Guard/Touching assist Assistive device: Walker-rolling   Walk 50 feet activity   Assist Walk 50 feet with 2 turns activity did not occur: Safety/medical  concerns  Assist level: Contact Guard/Touching assist Assistive device: Walker-rolling    Walk 150 feet activity   Assist Walk 150 feet activity did not occur: Safety/medical concerns (fatigue)         Walk 10 feet on uneven surface  activity   Assist Walk 10 feet on uneven surfaces activity did not occur: Safety/medical concerns         Wheelchair     Assist Will patient use wheelchair at discharge?: Yes Type of Wheelchair: Manual Wheelchair activity did not occur: Safety/medical concerns         Wheelchair 50 feet with 2 turns activity    Assist    Wheelchair 50 feet with 2 turns activity did not occur: Safety/medical concerns       Wheelchair 150 feet activity     Assist  Wheelchair 150 feet activity did not occur: Safety/medical concerns       Blood pressure (!) 117/57, pulse 78, temperature 98.6 F (37 C), temperature source Oral, resp. rate 20, height 5\' 3"  (1.6 m), weight 57.6 kg, SpO2 97 %.  1.  Lumbar burst fracture s/p ORIF T11-L4- B/B issues have resolved/improved.              -ELOS/Goals: minA 2-3 weeks  -Continue PT and OT- was able to show bathroom pics to OT 2.  Impaired mobility: -DVT/anticoagulation: Continue Lovenox 40 mg daily since renal function fine             -antiplatelet therapy: N/A 3. Pain Management: Oxycodone and robaxin prn.              --Lidocaine patches x3 added  for pain control             --Tylenol 650mg  TID scheduled.   -added scheduled oxycodone 5mg  daily 7:30am and 1:30pm   4/23 added 2.5mg  scheduled at night as well.     4/24 pain control much improved   4/25- says pain is still an issue- will move 7:30 am pain meds to 6:30 am   4/29- pain controlled- doesn't want to change regimen at all- con't regimen  4/30: provided with list of foods that can help with pain.      Continue prn oxycodone at 2.5mg  q8H prn to limit neurosedation   -Obtained XR right wrist where pain was worst--negative.    -  Apply ice. Pain improved with therapy and at rest.  4. Anxiety and depression: LCSW to follow for evaluation and support. Discussed with son her premorbid issues. Discussed starting Remeron for her anxiety and depression and son and husband agreeable. Start 7.5mg  4/20. Well tolerated, conitnue  5/1- bright affect; eating better- sleeping great- continue Remeron             -antipsychotic agents: N/A 5. Neuropsych: This patient may be intermittently capable of making decisions on her own behalf. 6. Skin/Wound Care: Routine pressure relief measures. May dc IV 7. Fluids/Electrolytes/Nutrition: Monitor I/O. Monitor lytes weekly. Eating much better with Remeron! 8. L2 burst fracture: Back precautions with brace when at EOB/OOB 9. Sternal fracture: Encourage  IS.  Continue lidocaine patch.  4/29- stopped lidoderm- didn't think was helpful 10. Neurogenic bladder: voiding well, incontinence, placed order for timed voiding q2H while awake.   4/29- emptying bladder- con't regimen 11. Neurogenic bowel: Was incontinent during the night ( Movantik, miralax and senna taken this am).              --On colace, miralax, senna, movantik and daily suppository but laxative/suppositories have been refused/held multiple days.               --Will order KUB to monitor for stool burden as family reports poor intake.              --will start with Senna S two in am and suppository after supper for bowel program.  4/12- cannot assess if pt is SCI pt- but is at high risk- will determine when can check strength exam.   4/23 moving bowels, formed stools.  4/30- gradually getting more formed stools- continue Lomotil prn 12. Acute blood loss anemia: H/H down to 9.2/94.9-->recheck in am.  13. Hyperglycemia: Likely due to stress--will order A1C for am.  14. Hypocalcemia: Ionized calcium 1.11.             --added calcium supplement.  15. Hypertension: systolic BP mildly elevated, monitor TID.  4/14- BP 120s/70s- con't  regimen 15. Baseline: patient used to walk 1-2 miles per day with her husband. Was very active around her home.   16. Hypokalemia  4/12- K+ 3.0- repleted and will recheck next 2 days.  4/13- K+ 3.8 today- will recheck Friday  4/29- Last K+ was 3.4- will replete and recheck Monday  17. Confusion: improved with decreasing oxycodone.  18. Diarrhea: d/c dulcolax suppository.  4/27- has lomotil prn- not on any meds scheduled right now 19. Dysuria:  F/u ucx negative. Sx improved  4/25- pt reports still having burning intermittently- but last check showed (+) U/A but no growth- if continues, will resend  4/26- will start Pyridium 100 mg TID x 2 days and recheck U/A and Cx, just to make sure- not sure Cx last week was indicative of Sx's.   4/27- pyridium is helpful- due to (+) U/A- will try Keflex 500 mg BID and wait for Cx.   4/28- Cx so fat shows >100k GNRs- pending sensitivities.  20. Itching  4/28- will add cortisone cream around incision, not on incision and d/c dressing since has steristrips in place  4/29-5/1 still itching, but better- continue regimen 21. Disposition: discussed home equipment, safety measures at home to minimize fall risk  LOS: 20 days A FACE TO FACE EVALUATION WAS PERFORMED  Clint Bolder P Mollee Neer 03/18/2021, 11:50 AM

## 2021-03-18 NOTE — Progress Notes (Signed)
Occupational Therapy Session Note  Patient Details  Name: Shelby Arellano MRN: [id number] Date of Birth: [birth year]  Today's Date: 03/18/2021 OT Individual Time: 3976-7341 OT Individual Time Calculation (min): 56 min   Short Term Goals: Week 1:  OT Short Term Goal 1 (Week 1): Pt will maintain sitting EOB for 5 mins with supervision during self-care tasks OT Short Term Goal 1 - Progress (Week 1): Met OT Short Term Goal 2 (Week 1): Pt will complete bathing wtih mod assist of one caregiver OT Short Term Goal 2 - Progress (Week 1): Progressing toward goal OT Short Term Goal 3 (Week 1): Pt will complete stand pivot transfer with mod assist of one caregiver OT Short Term Goal 3 - Progress (Week 1): Met  Skilled Therapeutic Interventions/Progress Updates:    Pt greeted in bed with pain reportedly manageable for tx. Spouse [name] present, concerned about toileting at home and also having suitable chairs for pt to sit in during the day. Pt reported having to void bowels. Pt completed supine<sit from flat bed without bedrails unassisted and then [place] donned pts TLSO. He provided CGA-supervision while pt used the RW to ambulate to the elevated toilet. Pt with continent bowel void. We brainstormed how pt could complete perihygiene herself as [name] reports that he wants her to be able to perform her own toileting if he is out running errands, leaving her alone for an hour at most if they cannot find someone to supervise while he's away. OT advised keeping a drop arm BSC beside her chair when he is away to increase safety. Pt also able to use lateral lean technique to complete perihygiene using the Rt hand with drop arm component lowered today. Both spouse and pt very pleased about this. Pt able to elevate brief with Min A for adjusting tightness before returning to EOB, donning skirt with Min A while adhering to her back precautions. Recommended pt use pull ups at home to further increase functional independence  with toileting. She then ambulated to the therapy apartment, spouse [name] provided CGA-close supervision assistance. We practiced sit<stands from couch and chairs with/without armrests using RW. Both spouse and pt problem solved seating options for home, [name] has a small stool for her to prop her feet up on, discussed how she can use the armless chair in the dining area when eating meals while adhering to her back precautions. Min cues for hand placement during sit<stands. She then returned to the room and [name] assisted with doffing brace given min cues. He cued her how to use logroll technique to return to bed. Pt remained in bed at close of session, all needs within reach, spouse present and checked off as trained caregiver for transfers.   We also solidified DME needs and this is documented on interprofessional sticky note  Therapy Documentation Precautions:  Precautions Precautions: Back,Sternal,Fall Precaution Comments: brace when OOB Required Braces or Orthoses: Spinal Brace Spinal Brace: Thoracolumbosacral orthotic,Applied in sitting position Restrictions Weight Bearing Restrictions: No ADL: ADL Grooming: Minimal assistance Where Assessed-Grooming: Edge of bed Upper Body Bathing: Dependent Where Assessed-Upper Body Bathing: Edge of bed Lower Body Bathing: Dependent (+2) Where Assessed-Lower Body Bathing: Edge of bed Upper Body Dressing: Dependent Where Assessed-Upper Body Dressing: Edge of bed :     Therapy/Group: Individual Therapy  Annalina Needles A Alizae Bechtel 03/18/2021, 3:23 PM

## 2021-03-18 NOTE — Plan of Care (Signed)
  Problem: Consults Goal: RH SPINAL CORD INJURY PATIENT EDUCATION Description:  See Patient Education module for education specifics.  Outcome: Progressing Goal: Skin Care Protocol Initiated - if Braden Score 18 or less Description: If consults are not indicated, leave blank or document N/A Outcome: Progressing   Problem: RH SKIN INTEGRITY Goal: RH STG ABLE TO PERFORM INCISION/WOUND CARE W/ASSISTANCE Description: STG Able To Perform Incision/Wound Care With min Assistance. Outcome: Progressing   Problem: RH SAFETY Goal: RH STG DECREASED RISK OF FALL WITH ASSISTANCE Description: STG Decreased Risk of Fall With min Assistance. Outcome: Progressing   Problem: RH PAIN MANAGEMENT Goal: RH STG PAIN MANAGED AT OR BELOW PT'S PAIN GOAL Description: <4 on a 0-10 pain scale Outcome: Progressing   Problem: RH KNOWLEDGE DEFICIT SCI Goal: RH STG INCREASE KNOWLEDGE OF SELF CARE AFTER SCI Description: Patient will demonstrate knowledge of medication management, pain management, skin/wound care, and weight bearing precautions with educational materials and handouts provided by staff, at discharge independently. Outcome: Progressing   Problem: RH SKIN INTEGRITY Goal: RH STG MAINTAIN SKIN INTEGRITY WITH ASSISTANCE Description: STG Maintain Skin Integrity With min Assistance. Outcome: Not Progressing   Problem: RH SAFETY Goal: RH STG ADHERE TO SAFETY PRECAUTIONS W/ASSISTANCE/DEVICE Description: STG Adhere to Safety Precautions With min Assistance/Device. Outcome: Not Progressing Note: Patient non compliant with bed alarms.

## 2021-03-19 LAB — CBC
HCT: 34.8 % — ABNORMAL LOW (ref 36.0–46.0)
Hemoglobin: 10.8 g/dL — ABNORMAL LOW (ref 12.0–15.0)
MCH: 31.5 pg (ref 26.0–34.0)
MCHC: 31 g/dL (ref 30.0–36.0)
MCV: 101.5 fL — ABNORMAL HIGH (ref 80.0–100.0)
Platelets: 287 10*3/uL (ref 150–400)
RBC: 3.43 MIL/uL — ABNORMAL LOW (ref 3.87–5.11)
RDW: 15.9 % — ABNORMAL HIGH (ref 11.5–15.5)
WBC: 5.8 10*3/uL (ref 4.0–10.5)
nRBC: 0 % (ref 0.0–0.2)

## 2021-03-19 MED ORDER — ACETAMINOPHEN 325 MG PO TABS: 650.0000 mg | ORAL_TABLET | Freq: Three times a day (TID) | ORAL | Status: DC

## 2021-03-19 MED ORDER — MELATONIN 3 MG PO TABS: 3.0000 mg | ORAL_TABLET | Freq: Every day | ORAL | 0 refills | Status: DC

## 2021-03-19 NOTE — Progress Notes (Signed)
Patient ID: Shelby Arellano, female   DOB: July 26, 1935, 86 y.o.   MRN: [id number]   Home care agency list provided to pt son  [place], [place] [phone]

## 2021-03-19 NOTE — Progress Notes (Addendum)
Physical Therapy Session Note  Patient Details  Name: Shelby Arellano MRN: [id number] Date of Birth: 06-Jun-1935  Today's Date: 03/19/2021 PT Individual Time: 1000-1045 PT Individual Time Calculation (min): 45 min   Short Term Goals: Week 3:  PT Short Term Goal 1 (Week 3): pt to demonstrate 50' min A gait with LRAD consistently PT Short Term Goal 2 (Week 3): pt to tolerate standing at min A for at least 5 mins PT Short Term Goal 3 (Week 3): pt to demonstrate bed mobility CGA PT Short Term Goal 4 (Week 3): pt to demonstrate functional transfers CGA with LRAD  Skilled Therapeutic Interventions/Progress Updates: Pt presents sitting in w/c and agreeable to therapy.  Pt wheeled to gym for time conservation.  Pt amb multiple trials w/ RW up to 150' and  Close supervision.  Pt requires verbal cues for posture and positioning in RW.  Pt performed standing reaching horseshoes to basketball hoop using B UEs, reaching forward outside of BOS, including w/ tandem stance, toe-taps to 6" w/ B UE on RW decreasing to unilateral and then w/o UE support, standing on [employer] cushion w/ eyes open and eyes closed, no UE support once stepped on to cushion w/ RW.   Pt performed transfer to sofa w/ CGA and verbal cues for use of arm rest.  Pt amb back to room and requested rest in bed.  Pt required mod A for doffing TLSO, spouse in room to continue ed.  Pt performed log roll sitting to R sidelying, w/ verbal cues for reaching in front of body to transition to lying.  Pt initially reached behind.Bed alarm on and all needs in reach.     Therapy Documentation Precautions:  Precautions Precautions: Back,Sternal,Fall Precaution Comments: brace when OOB Required Braces or Orthoses: Spinal Brace Spinal Brace: Thoracolumbosacral orthotic,Applied in sitting position Restrictions Weight Bearing Restrictions: No General:   Vital Signs:   Pain:2/10 w/ activity. Pain Assessment Pain Scale: 0-10 Pain Score: 0-No  pain Mobility:     Therapy/Group: Individual Therapy  Lucio Edward 03/19/2021, 11:58 AM

## 2021-03-19 NOTE — Progress Notes (Signed)
Patient ID: Shelby Arellano, female   DOB: 1935-10-11, 86 y.o.   MRN: [id number]   Drop Arm, Rolling [employer] ordered through [employer].  [place], [place] [phone]

## 2021-03-19 NOTE — Progress Notes (Signed)
Occupational Therapy Session Note  Patient Details  Name: Shelby Arellano MRN: [id number] Date of Birth: 1935/03/03  Today's Date: 03/19/2021 OT Individual Time: 1300-1343 OT Individual Time Calculation (min): 43 min    Short Term Goals: Week 2:  OT Short Term Goal 1 (Week 2): Pt will perform stand pivot transfer Min A of one caregiver OT Short Term Goal 1 - Progress (Week 2): Met OT Short Term Goal 2 (Week 2): Pt will consistently perform sit <> stands Min A to decrease caregiver burden OT Short Term Goal 2 - Progress (Week 2): Met OT Short Term Goal 3 (Week 2): Pt will thread LEs for LB dress with reacher no more than Mod A OT Short Term Goal 3 - Progress (Week 2): Progressing toward goal OT Short Term Goal 4 (Week 2): Pt will perform UB ADLs with no more than Min A seated EOB or sink OT Short Term Goal 4 - Progress (Week 2): Met Week 3:  OT Short Term Goal 1 (Week 3): STGs = LTGs d/t ELOS   Skilled Therapeutic Interventions/Progress Updates:    Pt greeted at time of session semireclined resting, agreeable to OT session. Husband not present. Pt did not recall practicing walk in shower transfers last week, agreeable to further practice today. Supine > sit Supervision and donned brace Min/Mod to encourage pt to don herself at home. Walked bed > ADL apartment and back CGA/close supervision throughout, focus on walk in shower transfers over small lip with posterior entry, performed with CGA/close supervision with RW needing Mod verbal cues. Once back in room, ambulated to/from toilet with BSC over top with CGA, hygiene same manner and Min A for clothing management d/t difficulty with brief. Sit > supine Supervision and alarm on call bell inreach.   Therapy Documentation Precautions:  Precautions Precautions: Back,Sternal,Fall Precaution Comments: brace when OOB Required Braces or Orthoses: Spinal Brace Spinal Brace: Thoracolumbosacral orthotic,Applied in sitting  position Restrictions Weight Bearing Restrictions: No    Therapy/Group: Individual Therapy  Viona Gilmore 03/19/2021, 7:24 AM

## 2021-03-19 NOTE — Progress Notes (Signed)
Physical Therapy Session Note  Patient Details  Name: Shelby Arellano MRN: [id number] Date of Birth: Jun 03, 1935  Today's Date: 03/19/2021 PT Individual Time: 0830-0930 PT Individual Time Calculation (min): 60 min   Short Term Goals: Week 1:  PT Short Term Goal 1 (Week 1): pt to demonstrate supine<>sit mod A of one PT Short Term Goal 1 - Progress (Week 1): Met PT Short Term Goal 2 (Week 1): pt to demonstrate functional transfers with LRAD mod A x1 PT Short Term Goal 2 - Progress (Week 1): Met PT Short Term Goal 3 (Week 1): to initate gait with LRAD at least 25' mod A PT Short Term Goal 3 - Progress (Week 1): Not met PT Short Term Goal 4 (Week 1): pt to tolerate sitting OOB 1 hour PT Short Term Goal 4 - Progress (Week 1): Met Week 2:  PT Short Term Goal 1 (Week 2): pt to demonstrate min A supine<>sit PT Short Term Goal 1 - Progress (Week 2): Met PT Short Term Goal 2 (Week 2): pt to demonstrate functional transfers with LRAD min A consistently PT Short Term Goal 2 - Progress (Week 2): Met PT Short Term Goal 3 (Week 2): pt to demonstrate 50' min A gait with LRAD PT Short Term Goal 3 - Progress (Week 2): Not met PT Short Term Goal 4 (Week 2): pt to tolerate standing at min A for at least 5 mins PT Short Term Goal 4 - Progress (Week 2): Not met Week 3:  PT Short Term Goal 1 (Week 3): pt to demonstrate 50' min A gait with LRAD consistently PT Short Term Goal 2 (Week 3): pt to tolerate standing at min A for at least 5 mins PT Short Term Goal 3 (Week 3): pt to demonstrate bed mobility CGA PT Short Term Goal 4 (Week 3): pt to demonstrate functional transfers CGA with LRAD  Skilled Therapeutic Interventions/Progress Updates:    Pain:  Pt reports minimal pain.  Treatment to tolerance.  Rest breaks and repositioning as needed.  Pt initially supine and pt and husband agreeable to treatment session w/focus on car transfer training.  Spine to sit w/hob elevated and use of bedrail w/supervision.   Discussed option of pt/family ordering bedrail if needed. TLSO donned by therapist for time management.  Pt able to don skirt w/supervision in sitting.  stand pivot transfer to wc w/RW and cga.   Gait 52f w/RW w/cues for posture, cga.  Commode transfer w/cga.  Independent w/hygiene and min assist w/management of brief.  Gait 739fw/cga, RW to wc.  Pt transported to outdoors for car transfer training w/personal vehicle.  Husband meets pt/PT w/vehicle (SMichele Rockers  Seat somewhat high, discussed technique for transfer, management of increased seat height.  Pt then performed car transfer w/RW and cga, cues for safe management of AD, hand placement, additional time.  Husband observed transfer.    Husband then assists pt safely w/car transfer, pt requires cga, single cue from PT for walker stabilization w/transfer.    At end of session, pt transported back to room.  Pt left oob in wc w/alarm belt set and needs in reach   Therapy Documentation Precautions:  Precautions Precautions: Back,Sternal,Fall Precaution Comments: brace when OOB Required Braces or Orthoses: Spinal Brace Spinal Brace: Thoracolumbosacral orthotic,Applied in sitting position Restrictions Weight Bearing Restrictions: No    Therapy/Group: Individual Therapy  BaCallie FieldingPTHickory Hill/12/2020, 12:31 PM

## 2021-03-19 NOTE — Progress Notes (Signed)
PROGRESS NOTE   Subjective/Complaints:  "better and not better".  Very urgent diarrhea at least 2x in last 24 hours.  Thought was either "c diff coming back or the PO ABX"- I explained most likely the Keflex- which we are stopping as of last night.  Burning with voiding has stopped completely and frequency/urgency is greatly improved.   Doing well otherwise.   ROS:   Pt denies SOB, abd pain, CP, N/V/C/D, and vision changes   Objective:   No results found. Recent Labs    03/19/21 0647  WBC 5.8  HGB 10.8*  HCT 34.8*  PLT 287   No results for input(s): NA, K, CL, CO2, GLUCOSE, BUN, CREATININE, CALCIUM in the last 72 hours.  Intake/Output Summary (Last 24 hours) at 03/19/2021 0836 Last data filed at 03/18/2021 1800 Gross per 24 hour  Intake 220 ml  Output --  Net 220 ml        Physical Exam: Vital Signs Blood pressure 118/62, pulse 67, temperature 98.1 F (36.7 C), temperature source Oral, resp. rate 18, height 5\' 3"  (1.6 m), weight 57.6 kg, SpO2 99 %.   General: awake, alert, appropriate, sitting up in bed- has memory book - using it;  NAD HENT: conjugate gaze; oropharynx moist CV: regular rate; no JVD Pulmonary: CTA B/L; no W/R/R- good air movement GI: soft, NT, ND, (+)BS Psychiatric: appropriate- interactive- pauses to think of questions frequently.  Neurological: decreased memory; repeats self occ; alert  Skin: back incision dressed- C/D/I- removed dressing- red/irritated around incision, likely from itching area- has steristrips- a LOT of them, in place- no drainage seen- not assessed today  sensation intact.  MMT 3-4/5 in LE, pain inhibition   Assessment/Plan: 1. Functional deficits which require 3+ hours per day of interdisciplinary therapy in a comprehensive inpatient rehab setting.  Physiatrist is providing close team supervision and 24 hour management of active medical problems listed  below.  Physiatrist and rehab team continue to assess barriers to discharge/monitor patient progress toward functional and medical goals  Care Tool:  Bathing    Body parts bathed by patient: Right arm,Left arm,Chest,Abdomen,Front perineal area,Right upper leg,Left upper leg,Right lower leg,Left lower leg,Face,Buttocks   Body parts bathed by helper: Buttocks     Bathing assist Assist Level: Contact Guard/Touching assist     Upper Body Dressing/Undressing Upper body dressing   What is the patient wearing?: Pull over shirt    Upper body assist Assist Level: Set up assist    Lower Body Dressing/Undressing Lower body dressing      What is the patient wearing?: Skirt     Lower body assist Assist for lower body dressing: Minimal Assistance - Patient > 75%     Toileting Toileting    Toileting assist Assist for toileting: Minimal Assistance - Patient > 75%     Transfers Chair/bed transfer  Transfers assist  Chair/bed transfer activity did not occur: Safety/medical concerns  Chair/bed transfer assist level: Contact Guard/Touching assist Chair/bed transfer assistive device:   Ambulation assist   Ambulation activity did not occur: Safety/medical concerns  Assist level: Contact Guard/Touching assist Assistive device: Walker-rolling Max distance: 136ft   Walk  10 feet activity   Assist  Walk 10 feet activity did not occur: Safety/medical concerns  Assist level: Contact Guard/Touching assist Assistive device: Walker-rolling   Walk 50 feet activity   Assist Walk 50 feet with 2 turns activity did not occur: Safety/medical concerns  Assist level: Contact Guard/Touching assist Assistive device: Walker-rolling    Walk 150 feet activity   Assist Walk 150 feet activity did not occur: Safety/medical concerns (fatigue)         Walk 10 feet on uneven surface  activity   Assist Walk 10 feet on uneven surfaces activity did  not occur: Safety/medical concerns         Wheelchair     Assist Will patient use wheelchair at discharge?: Yes Type of Wheelchair: Manual Wheelchair activity did not occur: Safety/medical concerns         Wheelchair 50 feet with 2 turns activity    Assist    Wheelchair 50 feet with 2 turns activity did not occur: Safety/medical concerns       Wheelchair 150 feet activity     Assist  Wheelchair 150 feet activity did not occur: Safety/medical concerns       Blood pressure 118/62, pulse 67, temperature 98.1 F (36.7 C), temperature source Oral, resp. rate 18, height 5\' 3"  (1.6 m), weight 57.6 kg, SpO2 99 %.  1.  Lumbar burst fracture s/p ORIF T11-L4- B/B issues have resolved/improved.              -ELOS/Goals: minA 2-3 weeks  -con't PT and OT 2.  Impaired mobility: -DVT/anticoagulation: Continue Lovenox 40 mg daily since renal function fine             -antiplatelet therapy: N/A 3. Pain Management: Oxycodone and robaxin prn.              --Lidocaine patches x3 added  for pain control             --Tylenol 650mg  TID scheduled.   -added scheduled oxycodone 5mg  daily 7:30am and 1:30pm   4/23 added 2.5mg  scheduled at night as well.     5/2- pain better controlled- con't regimen      Continue prn oxycodone at 2.5mg  q8H prn to limit neurosedation   -Obtained XR right wrist where pain was worst--negative.    - Apply ice. Pain improved with therapy and at rest.  4. Anxiety and depression: LCSW to follow for evaluation and support. Discussed with son her premorbid issues. Discussed starting Remeron for her anxiety and depression and son and husband agreeable. Start 7.5mg  4/20. Well tolerated, conitnue  5/1- bright affect; eating better- sleeping great- continue Remeron  5/2- pt reports appetite isn't great, but admits much better with Remeron- con't regmen             -antipsychotic agents: N/A 5. Neuropsych: This patient may be intermittently capable of making  decisions on her own behalf. 6. Skin/Wound Care: Routine pressure relief measures. May dc IV 7. Fluids/Electrolytes/Nutrition: Monitor I/O. Monitor lytes weekly. Eating much better with Remeron! 8. L2 burst fracture: Back precautions with brace when at EOB/OOB 9. Sternal fracture: Encourage IS.  Continue lidocaine patch.  4/29- stopped lidoderm- didn't think was helpful 10. Neurogenic bladder: voiding well, incontinence, placed order for timed voiding q2H while awake.   4/29- emptying bladder- con't regimen 11. Neurogenic bowel: Was incontinent during the night ( Movantik, miralax and senna taken this am).              --  On colace, miralax, senna, movantik and daily suppository but laxative/suppositories have been refused/held multiple days.               --Will order KUB to monitor for stool burden as family reports poor intake.              --will start with Senna S two in am and suppository after supper for bowel program.  4/12- cannot assess if pt is SCI pt- but is at high risk- will determine when can check strength exam.   4/23 moving bowels, formed stools.  4/30- gradually getting more formed stools- continue Lomotil prn  5/2- had urgent diarrhea- likely Keflex- which stopped last night- con't Lomotil prn.  12. Acute blood loss anemia: H/H down to 9.2/94.9-->recheck in am.  13. Hyperglycemia: Likely due to stress--will order A1C for am.  14. Hypocalcemia: Ionized calcium 1.11.             --added calcium supplement.  15. Hypertension: systolic BP mildly elevated, monitor TID.  4/14- BP 120s/70s- con't regimen 15. Baseline: patient used to walk 1-2 miles per day with her husband. Was very active around her home.   16. Hypokalemia  4/12- K+ 3.0- repleted and will recheck next 2 days.  4/13- K+ 3.8 today- will recheck Friday  4/29- Last K+ was 3.4- will replete and recheck Monday   5/2- CMP pending- con't to monitor 17. Confusion: improved with decreasing oxycodone.  18. Diarrhea:  d/c dulcolax suppository.  4/27- has lomotil prn- not on any meds scheduled right now 19. Dysuria:  F/u ucx negative. Sx improved  4/25- pt reports still having burning intermittently- but last check showed (+) U/A but no growth- if continues, will resend  4/26- will start Pyridium 100 mg TID x 2 days and recheck U/A and Cx, just to make sure- not sure Cx last week was indicative of Sx's.   4/27- pyridium is helpful- due to (+) U/A- will try Keflex 500 mg BID and wait for Cx.   4/28- Cx so fat shows >100k GNRs- pending sensitivities.   5/2- E coli- sensitive to everything- finished Keflex 20. Itching  4/28- will add cortisone cream around incision, not on incision and d/c dressing since has steristrips in place  4/29-5/1 still itching, but better- continue regimen 21. Disposition: discussed home equipment, safety measures at home to minimize fall risk  LOS: 21 days A FACE TO FACE EVALUATION WAS PERFORMED  Shelby Arellano 03/19/2021, 8:36 AM

## 2021-03-19 NOTE — Progress Notes (Signed)
Occupational Therapy Session Note  Patient Details  Name: Shelby Arellano MRN: [id number] Date of Birth: [birth year]  Today's Date: 03/19/2021 OT Individual Time: 1103-1203 OT Individual Time Calculation (min): 60 min   Skilled Therapeutic Interventions/Progress Updates:    Pt greeted in bed, pain reportedly manageable for tx. She was agreeable to shower. Husband Shelby Arellano agreeable to participate in hands on family training. Shelby Arellano assisted pt with TLSO once she was EOB. Shelby Arellano provided supervision assist while pt completed toileting, showering, and dressing tasks using RW at ambulatory level as needed. Min cues from therapist in regards to safety, sequencing of tasks, use of AE use for precaution adherence, and lateral lean technique in order for pt to complete her own pericare safely during toileting/bathing. Both pt and spouse actively d/c planning with therapist, very pleased regarding her overall progress. Pt transferred to bed at close of session, all needs within reach and bed alarm set.   Therapy Documentation Precautions:  Precautions Precautions: Back,Sternal,Fall Precaution Comments: brace when OOB Required Braces or Orthoses: Spinal Brace Spinal Brace: Thoracolumbosacral orthotic,Applied in sitting position Restrictions Weight Bearing Restrictions: No    ADL: ADL Grooming: Minimal assistance Where Assessed-Grooming: Edge of bed Upper Body Bathing: Dependent Where Assessed-Upper Body Bathing: Edge of bed Lower Body Bathing: Dependent (+2) Where Assessed-Lower Body Bathing: Edge of bed Upper Body Dressing: Dependent Where Assessed-Upper Body Dressing: Edge of bed      Therapy/Group: Individual Therapy  Derwin Reddy A Hiram Mciver 03/19/2021, 12:51 PM

## 2021-03-19 NOTE — Plan of Care (Signed)
  Problem: Consults Goal: RH SPINAL CORD INJURY PATIENT EDUCATION Description:  See Patient Education module for education specifics.  Outcome: Progressing Goal: Skin Care Protocol Initiated - if Braden Score 18 or less Description: If consults are not indicated, leave blank or document N/A Outcome: Progressing   Problem: RH SKIN INTEGRITY Goal: RH STG MAINTAIN SKIN INTEGRITY WITH ASSISTANCE Description: STG Maintain Skin Integrity With min Assistance. Outcome: Progressing Goal: RH STG ABLE TO PERFORM INCISION/WOUND CARE W/ASSISTANCE Description: STG Able To Perform Incision/Wound Care With min Assistance. Outcome: Progressing   Problem: RH SAFETY Goal: RH STG ADHERE TO SAFETY PRECAUTIONS W/ASSISTANCE/DEVICE Description: STG Adhere to Safety Precautions With min Assistance/Device. Outcome: Progressing Goal: RH STG DECREASED RISK OF FALL WITH ASSISTANCE Description: STG Decreased Risk of Fall With min Assistance. Outcome: Progressing   Problem: RH PAIN MANAGEMENT Goal: RH STG PAIN MANAGED AT OR BELOW PT'S PAIN GOAL Description: <4 on a 0-10 pain scale Outcome: Progressing   Problem: RH KNOWLEDGE DEFICIT SCI Goal: RH STG INCREASE KNOWLEDGE OF SELF CARE AFTER SCI Description: Patient will demonstrate knowledge of medication management, pain management, skin/wound care, and weight bearing precautions with educational materials and handouts provided by staff, at discharge independently. Outcome: Progressing   

## 2021-03-20 LAB — COMPREHENSIVE METABOLIC PANEL
ALT: 24 U/L (ref 0–44)
AST: 24 U/L (ref 15–41)
Albumin: 2.9 g/dL — ABNORMAL LOW (ref 3.5–5.0)
Alkaline Phosphatase: 217 U/L — ABNORMAL HIGH (ref 38–126)
Anion gap: 9 (ref 5–15)
BUN: 9 mg/dL (ref 8–23)
CO2: 23 mmol/L (ref 22–32)
Calcium: 9.2 mg/dL (ref 8.9–10.3)
Chloride: 105 mmol/L (ref 98–111)
Creatinine, Ser: 0.82 mg/dL (ref 0.44–1.00)
GFR, Estimated: >60 mL/min (ref >60)
Glucose, Bld: 98 mg/dL (ref 70–99)
Potassium: 3.6 mmol/L (ref 3.5–5.1)
Sodium: 137 mmol/L (ref 135–145)
Total Bilirubin: 0.5 mg/dL (ref 0.3–1.2)
Total Protein: 5.9 g/dL — ABNORMAL LOW (ref 6.5–8.1)

## 2021-03-20 MED ORDER — MIRTAZAPINE 15 MG PO TABS
30.0000 mg | ORAL_TABLET | Freq: Every day | ORAL | Status: DC
  Administered 2021-03-20: 30 mg via ORAL
  Filled 2021-03-20: qty 2

## 2021-03-20 NOTE — Progress Notes (Signed)
Inpatient Rehabilitation Care Coordinator Discharge Note  The overall goal for the admission was met for:   Discharge location: Yes, home  Length of Stay: Yes, 22 days  Discharge activity level: Yes  Home/community participation: Yes  Services provided included: MD, RD, PT, OT, SLP, RN, CM, TR, Pharmacy, Neuropsych and SW  Financial Services: Medicare  Choices offered to/list presented to: pt, spouse and son  Follow-up services arranged: Outpatient: [facility]   Comments (or additional information): PT OT ST Wheelchair, Rolling, Drop Arm Bedside Commode, Shower Chair  Patient/Family verbalized understanding of follow-up arrangements: Yes  Individual responsible for coordination of the follow-up plan: [name], [phone]  Confirmed correct DME delivered: Dyanne Iha 03/20/2021    Dyanne Iha

## 2021-03-20 NOTE — Progress Notes (Signed)
Physical Therapy Discharge Summary  Patient Details  Name: Shelby Arellano MRN: [id number] Date of Birth: 1935-09-10  Today's Date: 03/20/2021 PT Individual Time: 1315-1430 PT Individual Time Calculation (min): 75 min    Patient has met 7of 9 long term goals due to improved activity tolerance, improved balance, improved postural control, increased strength, decreased pain, improved attention and improved awareness.  Patient to discharge at an ambulatory level Supervision.   Patient's care partner is independent to provide the necessary physical assistance at discharge.   Reasons goals not met: manual WC mobility has not been a primary focus of therapy sessions at this time and pt's family plan to assist with all WC needs and mobility.  Recommendation:  Patient will benefit from ongoing skilled PT services in home health setting to continue to advance safe functional mobility, address ongoing impairments in standing balance, gait training, stair training, standing tolerance, and minimize fall risk.  Equipment: manual WC, BSC  Reasons for discharge: discharge from hospital  Patient/family agrees with progress made and goals achieved: Yes   Pt's husband and son has been present during multiple PT sessions. Pt's husband has been trained on gait, transfers, use of WC and Rolling walker, stair training, car transfers, toileting, brace use. He denies further questions or concerns at this time.    PT Discharge Precautions/Restrictions Precautions Precautions: Back;Sternal;Fall Precaution Comments: brace when OOB Required Braces or Orthoses: Spinal Brace Spinal Brace: Thoracolumbosacral orthotic;Applied in sitting position Restrictions Weight Bearing Restrictions: No Vital Signs Therapy Vitals Temp: 98.4 F (36.9 C) Temp Source: Oral Pulse Rate: 87 Resp: 16 BP: (!) 112/57 Patient Position (if appropriate): Lying Oxygen Therapy SpO2: 100 % O2 Device: Room Air Pain Pain  Assessment Pain Scale: 0-10 Pain Score: 4  Pain Type: Surgical pain Pain Location: Back Pain Orientation: Lower Pain Descriptors / Indicators: Aching Vision/Perception  Perception Perception: Within Functional Limits Praxis Praxis: Intact  Cognition Overall Cognitive Status: Within Functional Limits for tasks assessed Arousal/Alertness: Awake/alert Orientation Level: Oriented X4 Attention: Focused;Sustained Focused Attention: Appears intact Sustained Attention: Appears intact Memory: Appears intact Awareness: Appears intact Problem Solving: Appears intact Safety/Judgment: Appears intact Sensation Sensation Light Touch: Appears Intact Proprioception: Appears Intact Coordination Gross Motor Movements are Fluid and Coordinated: No Fine Motor Movements are Fluid and Coordinated: Yes Motor  Motor Motor: Within Functional Limits  Mobility Bed Mobility Bed Mobility: Rolling Right;Rolling Left;Supine to Sit;Sit to Supine;Scooting to [facility];Sitting - Scoot to [employer] of Bed Rolling Right: Independent with assistive device Rolling Left: Independent with assistive device Supine to Sit: Supervision/Verbal cueing Sitting - Scoot to Edge of Bed: Supervision/Verbal cueing Sit to Supine: Supervision/Verbal cueing Scooting to HOB: Supervision/Verbal Cueing Transfers Transfers: Sit to Stand;Stand to Sit Sit to Stand: Contact Guard/Touching assist Stand to Sit: Contact Guard/Touching assist Stand Pivot Transfers: Contact Guard/Touching assist Stand Pivot Transfer Details: Verbal cues for sequencing;Verbal cues for technique Transfer (Assistive device): Rolling walker Locomotion  Gait Ambulation: Yes Gait Assistance: Contact Guard/Touching assist Gait Gait: Yes Gait Pattern: Impaired Gait Pattern: Step-through pattern;Trunk flexed;Decreased stride length Gait velocity: reduced Stairs / Additional Locomotion Ramp: Nurse, mental health  Mobility: Yes Wheelchair Assistance: Minimal assistance - Patient >75% Wheelchair Propulsion: Both upper extremities Wheelchair Parts Management: Supervision/cueing;Needs assistance Distance: 200  Trunk/Postural Assessment  Cervical Assessment Cervical Assessment: Within Functional Limits Thoracic Assessment Thoracic Assessment: Exceptions to [facility] Lumbar Assessment Lumbar Assessment: Exceptions to [facility] Postural Control Postural Control: Within Functional Limits  Balance Balance Balance Assessed: Yes Standardized Balance Assessment Standardized Balance Assessment: Timed  Up and Go Test Timed Up and Go Test TUG: Normal TUG Normal TUG (seconds): 36 Static Sitting Balance Static Sitting - Balance Support: Feet supported Static Sitting - Level of Assistance: 6: Modified independent (Device/Increase time) Dynamic Sitting Balance Dynamic Sitting - Balance Support: During functional activity Dynamic Sitting - Level of Assistance: 5: Stand by assistance Dynamic Sitting - Balance Activities: Lateral lean/weight shifting;Reaching for objects;Reaching for weighted objects Static Standing Balance Static Standing - Balance Support: During functional activity;Left upper extremity supported Static Standing - Level of Assistance: 5: Stand by assistance Dynamic Standing Balance Dynamic Standing - Balance Support: During functional activity;Bilateral upper extremity supported Dynamic Standing - Level of Assistance: 5: Stand by assistance Dynamic Standing - Balance Activities: Lateral lean/weight shifting;Reaching for objects;Forward lean/weight shifting Extremity Assessment      RLE Assessment RLE Assessment: Exceptions to [facility] Passive Range of Motion (PROM) Comments: [facility] Strength Comments: grossly 4/5 LLE Assessment LLE Assessment: Exceptions to [facility] Passive Range of Motion (PROM) Comments: [facility] Strength Comments: grossly 4/5    [name] 03/20/2021, 3:27 PM

## 2021-03-20 NOTE — Progress Notes (Signed)
PROGRESS NOTE   Subjective/Complaints:  Pt reports a lot better except diarrhea x3 this AM- took Lamotil- never had IBS, but this has been a chronic issue for her.  Slept well x2 days/nights so far.     ROS:   Pt denies SOB, abd pain, CP, N/V/C, and vision changes  Objective:   No results found. Recent Labs    03/19/21 0647  WBC 5.8  HGB 10.8*  HCT 34.8*  PLT 287   Recent Labs    03/20/21 0519  NA 137  K 3.6  CL 105  CO2 23  GLUCOSE 98  BUN 9  CREATININE 0.82  CALCIUM 9.2    Intake/Output Summary (Last 24 hours) at 03/20/2021 0848 Last data filed at 03/20/2021 0742 Gross per 24 hour  Intake 440 ml  Output 250 ml  Net 190 ml        Physical Exam: Vital Signs Blood pressure (!) 120/56, pulse 78, temperature 99.3 F (37.4 C), resp. rate 16, height 5\' 3"  (1.6 m), weight 54.3 kg, SpO2 98 %.    General: awake, alert, appropriate, sitting up slightly in bed- hasn't eaten yet; NAD HENT: conjugate gaze; oropharynx moist CV: regular rate; no JVD Pulmonary: CTA B/L; no W/R/R- good air movement GI: soft, NT, ND, (+)BS Psychiatric: appropriate- prolonged responses/delayed Neurological: alert- but takes a long time to articulate her ideas   Skin: back incision dressed- C/D/I- removed dressing- red/irritated around incision, likely from itching area- has steristrips- a LOT of them, in place- no drainage seen- not assessed today  sensation intact.  MMT 3-4/5 in LE, pain inhibition   Assessment/Plan: 1. Functional deficits which require 3+ hours per day of interdisciplinary therapy in a comprehensive inpatient rehab setting.  Physiatrist is providing close team supervision and 24 hour management of active medical problems listed below.  Physiatrist and rehab team continue to assess barriers to discharge/monitor patient progress toward functional and medical goals  Care Tool:  Bathing    Body parts  bathed by patient: Right arm,Left arm,Chest,Abdomen,Front perineal area,Right upper leg,Left upper leg,Right lower leg,Left lower leg,Face,Buttocks   Body parts bathed by helper: Buttocks     Bathing assist Assist Level: Supervision/Verbal cueing     Upper Body Dressing/Undressing Upper body dressing   What is the patient wearing?: Pull over shirt    Upper body assist Assist Level: Set up assist    Lower Body Dressing/Undressing Lower body dressing      What is the patient wearing?: Incontinence brief     Lower body assist Assist for lower body dressing: Minimal Assistance - Patient > 75% (using reacher, brief set up as pull up)     Toileting Toileting    Toileting assist Assist for toileting: Minimal Assistance - Patient > 75%     Transfers Chair/bed transfer  Transfers assist  Chair/bed transfer activity did not occur: Safety/medical concerns  Chair/bed transfer assist level: Contact Guard/Touching assist Chair/bed transfer assistive device:   Ambulation assist   Ambulation activity did not occur: Safety/medical concerns  Assist level: Supervision/Verbal cueing Assistive device: Walker-rolling Max distance: 150   Walk 10 feet activity   Assist  Walk 10 feet activity did not occur: Safety/medical concerns  Assist level: Supervision/Verbal cueing Assistive device: Walker-rolling   Walk 50 feet activity   Assist Walk 50 feet with 2 turns activity did not occur: Safety/medical concerns  Assist level: Supervision/Verbal cueing Assistive device: Walker-rolling    Walk 150 feet activity   Assist Walk 150 feet activity did not occur: Safety/medical concerns (fatigue)  Assist level: Supervision/Verbal cueing Assistive device: Walker-rolling    Walk 10 feet on uneven surface  activity   Assist Walk 10 feet on uneven surfaces activity did not occur: Safety/medical concerns         Wheelchair     Assist  Will patient use wheelchair at discharge?: Yes Type of Wheelchair: Manual Wheelchair activity did not occur: Safety/medical concerns         Wheelchair 50 feet with 2 turns activity    Assist    Wheelchair 50 feet with 2 turns activity did not occur: Safety/medical concerns       Wheelchair 150 feet activity     Assist  Wheelchair 150 feet activity did not occur: Safety/medical concerns       Blood pressure (!) 120/56, pulse 78, temperature 99.3 F (37.4 C), resp. rate 16, height 5\' 3"  (1.6 m), weight 54.3 kg, SpO2 98 %.  1.  Lumbar burst fracture s/p ORIF T11-L4- B/B issues have resolved/improved.              -ELOS/Goals: minA 2-3 weeks  -con't PT and OT 2.  Impaired mobility: -DVT/anticoagulation: Continue Lovenox 40 mg daily since renal function fine             -antiplatelet therapy: N/A 3. Pain Management: Oxycodone and robaxin prn.              --Lidocaine patches x3 added  for pain control             --Tylenol 650mg  TID scheduled.   -added scheduled oxycodone 5mg  daily 7:30am and 1:30pm   4/23 added 2.5mg  scheduled at night as well.     5/3- Pain doing better daily- con't regimen     Continue prn oxycodone at 2.5mg  q8H prn to limit neurosedation   -Obtained XR right wrist where pain was worst--negative.    - Apply ice. Pain improved with therapy and at rest.  4. Anxiety and depression: LCSW to follow for evaluation and support. Discussed with son her premorbid issues. Discussed starting Remeron for her anxiety and depression and son and husband agreeable. Start 7.5mg  4/20. Well tolerated, conitnue  5/1- bright affect; eating better- sleeping great- continue Remeron  5/2- pt reports appetite isn't great, but admits much better with Remeron- con't regimen  5/3- hadn't eaten yet, but started by the time I left- "is poor eater" in general- doesn't think her weight has changed much from baseline. Will increase Remeron to appetite stimulant dose 30 mg QHS.                -antipsychotic agents: N/A 5. Neuropsych: This patient may be intermittently capable of making decisions on her own behalf. 6. Skin/Wound Care: Routine pressure relief measures. May dc IV 7. Fluids/Electrolytes/Nutrition: Monitor I/O. Monitor lytes weekly. Eating much better with Remeron! 8. L2 burst fracture: Back precautions with brace when at EOB/OOB 9. Sternal fracture: Encourage IS.  Continue lidocaine patch.  4/29- stopped lidoderm- didn't think was helpful 10. Neurogenic bladder: voiding well, incontinence, placed order for timed voiding q2H while awake.   4/29- emptying bladder- con't  regimen 11. Neurogenic bowel: Was incontinent during the night ( Movantik, miralax and senna taken this am).              --On colace, miralax, senna, movantik and daily suppository but laxative/suppositories have been refused/held multiple days.               --Will order KUB to monitor for stool burden as family reports poor intake.              --will start with Senna S two in am and suppository after supper for bowel program.  4/12- cannot assess if pt is SCI pt- but is at high risk- will determine when can check strength exam.   4/23 moving bowels, formed stools.  4/30- gradually getting more formed stools- continue Lomotil prn  5/2- had urgent diarrhea- likely Keflex- which stopped last night- con't Lomotil prn.  5/3- needing Lomotil prn last 2 days- will monitor since just had PO ABX.   12. Acute blood loss anemia: H/H down to 9.2/94.9-->recheck in am.  13. Hyperglycemia: Likely due to stress--will order A1C for am.  14. Hypocalcemia: Ionized calcium 1.11.             --added calcium supplement.  15. Hypertension: systolic BP mildly elevated, monitor TID.  4/14- BP 120s/70s- con't regimen 15. Baseline: patient used to walk 1-2 miles per day with her husband. Was very active around her home.   16. Hypokalemia  4/12- K+ 3.0- repleted and will recheck next 2 days.  4/13- K+ 3.8 today-  will recheck Friday  4/29- Last K+ was 3.4- will replete and recheck Monday   5/2- CMP pending- con't to monitor  5/3- K+ 3.6- con't regimen 17. Confusion: improved with decreasing oxycodone.  18. Diarrhea: d/c dulcolax suppository.  4/27- has lomotil prn- not on any meds scheduled right now 19. Dysuria:  F/u ucx negative. Sx improved  4/25- pt reports still having burning intermittently- but last check showed (+) U/A but no growth- if continues, will resend  4/26- will start Pyridium 100 mg TID x 2 days and recheck U/A and Cx, just to make sure- not sure Cx last week was indicative of Sx's.   4/27- pyridium is helpful- due to (+) U/A- will try Keflex 500 mg BID and wait for Cx.   4/28- Cx so fat shows >100k GNRs- pending sensitivities.   5/2- E coli- sensitive to everything- finished Keflex 20. Itching  4/28- will add cortisone cream around incision, not on incision and d/c dressing since has steristrips in place  4/29-5/1 still itching, but better- continue regimen 21. Disposition: discussed home equipment, safety measures at home to minimize fall risk  LOS: 22 days A FACE TO FACE EVALUATION WAS PERFORMED  Makaiyah Schweiger 03/20/2021, 8:48 AM

## 2021-03-20 NOTE — Progress Notes (Signed)
Occupational Therapy Session Note  Patient Details  Name: Shelby Arellano MRN: [id number] Date of Birth: 1935/01/01  Today's Date: 03/20/2021 OT Individual Time: 0110-0349 OT Individual Time Calculation (min): 72 min    Short Term Goals: Week 1:  OT Short Term Goal 1 (Week 1): Pt will maintain sitting EOB for 5 mins with supervision during self-care tasks OT Short Term Goal 1 - Progress (Week 1): Met OT Short Term Goal 2 (Week 1): Pt will complete bathing wtih mod assist of one caregiver OT Short Term Goal 2 - Progress (Week 1): Progressing toward goal OT Short Term Goal 3 (Week 1): Pt will complete stand pivot transfer with mod assist of one caregiver OT Short Term Goal 3 - Progress (Week 1): Met Week 2:  OT Short Term Goal 1 (Week 2): Pt will perform stand pivot transfer Min A of one caregiver OT Short Term Goal 1 - Progress (Week 2): Met OT Short Term Goal 2 (Week 2): Pt will consistently perform sit <> stands Min A to decrease caregiver burden OT Short Term Goal 2 - Progress (Week 2): Met OT Short Term Goal 3 (Week 2): Pt will thread LEs for LB dress with reacher no more than Mod A OT Short Term Goal 3 - Progress (Week 2): Progressing toward goal OT Short Term Goal 4 (Week 2): Pt will perform UB ADLs with no more than Min A seated EOB or sink OT Short Term Goal 4 - Progress (Week 2): Met Week 3:  OT Short Term Goal 1 (Week 3): STGs = LTGs d/t ELOS   Skilled Therapeutic Interventions/Progress Updates:    Pt greeted at time of session supine in bed resting with husband present who reamined throughout session for training. Bed mobility supine > sit no rail with CGA and donned brace Min A with husband assisting. Ambulated bed > bathroom CGA/close supervision and transferred to toilet same manner. Hygiene for periarea Supervision, as well as simulated and discussed for posterior hygiene to prctice for home within precautions. Demonstrating/educaiton with lateral leans as well. Walked bathroom  > ADL apartment > room with CGA/CS with RW and focus on ADL apartment on transfer technqiue for walk in shower with posterior entry method, able to complete CGA/CS x2 once with therapist and additional time with husband. Provided hand out with step by step instructions for the pt/husband as well d/t mild hesitancy remembering steps/process. Confirmed placement of DME in bathroom as well. Focus of remaining session on family ed, DME training, home set up, etc. Pt up in wheelchair with call bell in reach and husband present.   Therapy Documentation Precautions:  Precautions Precautions: Back,Sternal,Fall Precaution Comments: brace when OOB Required Braces or Orthoses: Spinal Brace Spinal Brace: Thoracolumbosacral orthotic,Applied in sitting position Restrictions Weight Bearing Restrictions: No    Therapy/Group: Individual Therapy  Viona Gilmore 03/20/2021, 7:14 AM

## 2021-03-20 NOTE — Progress Notes (Signed)
Patient ID: Shelby Arellano, female   DOB: 1935/08/16, 86 y.o.   MRN: [id number]   Pt DME ordered through [employer].  [place], [place] [phone]

## 2021-03-20 NOTE — Progress Notes (Signed)
Patient ID: Shelby Arellano, female   DOB: 04-11-1935, 86 y.o.   MRN: [id number]  Team Conference Report to Patient/Family  Team Conference discussion was reviewed with the patient and caregiver, including goals, any changes in plan of care and target discharge date.  Patient and caregiver express understanding and are in agreement.  The patient has a target discharge date of 03/21/21.  Sw met with pt and spouse in room, provided updates. Pt medically stable for d/c, spouse and son completed family education and all DME ordered. Pt ready for d/c on tomorrow. Dyanne Iha 03/20/2021, 12:18 PM

## 2021-03-20 NOTE — Progress Notes (Signed)
Occupational Therapy Discharge Summary  Patient Details  Name: Shelby Arellano MRN: [id number] Date of Birth: June 07, 1935   Patient has met 10 of 10 long term goals due to improved activity tolerance, improved balance, postural control, ability to compensate for deficits, improved awareness and improved coordination.  Patient to discharge at overall Supervision level.  Patient's care partner is independent to provide the necessary physical and cognitive assistance at discharge.  The pt has shown significant progress during her time at [facility] and is now able to ambulate to/from bathroom and household distances for ADL with RW, and perform all ADL transfers with CGA. Pt adheres to back precautions but does occasionally need cues for problem solving and sequencing d/t reduced cognition. Pt's husband has completed training multiple dates and is very involved.   Reasons goals not met: NA  Recommendation:  Patient will benefit from ongoing skilled OT services in home health setting to continue to advance functional skills in the area of BADL and Reduce care partner burden.  Equipment: drop arm BSC, shower seat  Reasons for discharge: treatment goals met and discharge from hospital  Patient/family agrees with progress made and goals achieved: Yes  OT Discharge Precautions/Restrictions  Precautions Precautions: Back;Sternal;Fall Precaution Comments: brace when OOB Required Braces or Orthoses: Spinal Brace Spinal Brace: Thoracolumbosacral orthotic;Applied in sitting position Restrictions Weight Bearing Restrictions: No Vital Signs Therapy Vitals Temp: 98.4 F (36.9 C) Temp Source: Oral Pulse Rate: 87 Resp: 16 BP: (!) 112/57 Patient Position (if appropriate): Lying Oxygen Therapy SpO2: 100 % O2 Device: Room Air Pain Pain Assessment Pain Scale: 0-10 Pain Score: 4  Pain Type: Surgical pain Pain Location: Back Pain Orientation: Lower Pain Descriptors / Indicators: Aching Pain Frequency:  Intermittent Pain Onset: With Activity Patients Stated Pain Goal: 0 Pain Intervention(s): Medication (See eMAR) (sched oxy IR given) ADL ADL Equipment Provided: Reacher,Sock aid,Long-handled sponge Eating: Set up Grooming: Supervision/safety Where Assessed-Grooming: Standing at sink Upper Body Bathing: Supervision/safety Where Assessed-Upper Body Bathing: Edge of bed Lower Body Bathing: Supervision/safety Where Assessed-Lower Body Bathing: Shower Upper Body Dressing: Setup Where Assessed-Upper Body Dressing: Edge of bed Lower Body Dressing: Minimal assistance Where Assessed-Lower Body Dressing: Edge of bed Toileting: Minimal assistance Where Assessed-Toileting: Toilet,Bedside Commode Toilet Transfer: Therapist, music Method: Counselling psychologist: Network engineer guard Vision Baseline Vision/History: Wears glasses Wears Glasses: Reading only Patient Visual Report: No change from baseline Vision Assessment?: No apparent visual deficits Perception  Perception: Within Functional Limits Praxis Praxis: Intact Cognition Overall Cognitive Status: Within Functional Limits for tasks assessed Arousal/Alertness: Awake/alert Orientation Level: Oriented X4 Attention: Focused;Sustained Focused Attention: Appears intact Sustained Attention: Appears intact Memory: Appears intact Awareness: Appears intact Problem Solving: Appears intact Safety/Judgment: Appears intact Sensation Sensation Light Touch: Appears Intact Proprioception: Appears Intact Coordination Gross Motor Movements are Fluid and Coordinated: No Fine Motor Movements are Fluid and Coordinated: Yes Motor  Motor Motor: Within Functional Limits Mobility  Bed Mobility Bed Mobility: Rolling Right;Rolling Left;Supine to Sit;Sit to Supine;Scooting to [facility];Sitting - Scoot to [employer] of Bed Rolling Right: Independent with assistive device Rolling Left: Independent  with assistive device Supine to Sit: Supervision/Verbal cueing Sitting - Scoot to Edge of Bed: Supervision/Verbal cueing Sit to Supine: Supervision/Verbal cueing Scooting to HOB: Supervision/Verbal Cueing Transfers Sit to Stand: Contact Guard/Touching assist Stand to Sit: Contact Guard/Touching assist  Trunk/Postural Assessment  Cervical Assessment Cervical Assessment: Within Functional Limits Thoracic Assessment Thoracic Assessment: Exceptions to [facility] Lumbar Assessment Lumbar Assessment: Exceptions to [facility] Postural Control Postural  Control: Within Functional Limits  Balance Balance Balance Assessed: Yes Standardized Balance Assessment Standardized Balance Assessment: Timed Up and Go Test Timed Up and Go Test TUG: Normal TUG Normal TUG (seconds): 36 Static Sitting Balance Static Sitting - Balance Support: Feet supported Static Sitting - Level of Assistance: 6: Modified independent (Device/Increase time) Dynamic Sitting Balance Dynamic Sitting - Balance Support: During functional activity Dynamic Sitting - Level of Assistance: 5: Stand by assistance Dynamic Sitting - Balance Activities: Lateral lean/weight shifting;Reaching for objects Static Standing Balance Static Standing - Balance Support: During functional activity;Left upper extremity supported Static Standing - Level of Assistance: 5: Stand by assistance Dynamic Standing Balance Dynamic Standing - Balance Support: During functional activity;Bilateral upper extremity supported Dynamic Standing - Level of Assistance: 5: Stand by assistance Dynamic Standing - Balance Activities: Lateral lean/weight shifting;Reaching for objects;Forward lean/weight shifting Extremity/Trunk Assessment RUE Assessment RUE Assessment: Within Functional Limits LUE Assessment LUE Assessment: Within Functional Limits   Viona Gilmore 03/20/2021, 4:59 PM

## 2021-03-20 NOTE — Progress Notes (Signed)
Physical Therapy Session Note  Patient Details  Name: Shelby Arellano MRN: [id number] Date of Birth: 17-Sep-1935  Today's Date: 03/20/2021 PT Individual Time: 1030-1055 PT Individual Time Calculation (min): 25 min   Short Term Goals: Week 2:  PT Short Term Goal 1 (Week 2): pt to demonstrate min A supine<>sit PT Short Term Goal 1 - Progress (Week 2): Met PT Short Term Goal 2 (Week 2): pt to demonstrate functional transfers with LRAD min A consistently PT Short Term Goal 2 - Progress (Week 2): Met PT Short Term Goal 3 (Week 2): pt to demonstrate 50' min A gait with LRAD PT Short Term Goal 3 - Progress (Week 2): Not met PT Short Term Goal 4 (Week 2): pt to tolerate standing at min A for at least 5 mins PT Short Term Goal 4 - Progress (Week 2): Not met Week 3:  PT Short Term Goal 1 (Week 3): pt to demonstrate 50' min A gait with LRAD consistently PT Short Term Goal 2 (Week 3): pt to tolerate standing at min A for at least 5 mins PT Short Term Goal 3 (Week 3): pt to demonstrate bed mobility CGA PT Short Term Goal 4 (Week 3): pt to demonstrate functional transfers CGA with LRAD  Skilled Therapeutic Interventions/Progress Updates:   Received pt sitting in [facility] with husband present at bedside, pt agreeable to therapy, and denied any pain during session. RN present to administer medications. Session with emphasis on functional mobility/transfers, generalized strengthening, dynamic standing balance/coordination, ambulation, and improved activity tolerance. TLSO donned during session. Pt transported to ortho gym in [facility] total A for time management purposes and performed BUE strengthening on UBE at level 1 for 1 minute forward and 1 minute backwards with emphasis on core/trunk engagement. Pt ambulated >237f with RW and supervision. PT demonstrates good RW safety awareness. Pt performed the following exercises standing with BUE support on RW and supervision: -heel raises 2x15 -alternating marches 2x10  bilaterally  Doffed TLSO and transferred sit<>supine with supervision. Concluded session with pt supine in bed, needs within reach, and bed alarm on.   Therapy Documentation Precautions:  Precautions Precautions: Back,Sternal,Fall Precaution Comments: brace when OOB Required Braces or Orthoses: Spinal Brace Spinal Brace: Thoracolumbosacral orthotic,Applied in sitting position Restrictions Weight Bearing Restrictions: No  Therapy/Group: Individual Therapy AAlfonse AlpersPT, DPT   03/20/2021, 7:36 AM

## 2021-03-20 NOTE — Patient Care Conference (Signed)
Inpatient RehabilitationTeam Conference and Plan of Care Update Date: 03/20/2021   Time: 11:31 AM    Patient Name: Shelby Arellano      Medical Record Number: [id number]  Date of Birth: 06-05-1935 Sex: Female         Room/Bed: 4M13C/4M13C-01 Payor Info: Payor: AARP / Plan: AARP / Product Type: *No Product type* /    Admit Date/Time:  02/26/2021  2:18 PM  Primary Diagnosis:  Lumbar burst fracture, sequela  Hospital Problems: Principal Problem:   Lumbar burst fracture, sequela Active Problems:   Acute pain due to trauma   Pain    Expected Discharge Date: Expected Discharge Date: 03/21/21  Team Members Present: Physician leading conference: Dr. Genice Rouge Care Coodinator Present: Lavera Guise, BSW;Vannie Hilgert Marlyne Beards, RN, BSN, CRRN Nurse Present: Kennyth Arnold, RN PT Present: Otelia Sergeant, PT OT Present: Earleen Newport, OT PPS Coordinator present : Fae Pippin, SLP     Current Status/Progress Goal Weekly Team Focus  Bowel/Bladder   Continent of bladder and bowel. LBM 03/20/2021  Remain continent, no incontinent episodes.  Assess Qshift and PRN   Swallow/Nutrition/ Hydration             ADL's   functional mobility and transfers (toilet, bed, shower) CGA/CS, Min/Mod for brace, LB dressing skirts only Supervision, Pain improved  Min A overall, Supervision transfers  continue OOB tolerance, don/doff brace, functional mobility, walk in shower transfers, family ed with husband   Mobility   CGA for bed mob, transfers CGA, CGA static sitting balance, gait 200' min A, standing bal CGA-MIN A with RW, WC mobility CGA  CGA transfers and gait 150' LRAD, supervision bed mob, supervision WC  balance transfers, gait, tolerance OOB, pain management   Communication             Safety/Cognition/ Behavioral Observations            Pain   Back pain 5/10. Scheduled oxy.  pt will have pain that is <=3/10  Assess Pain Qshift and PRN   Skin   Surgical dressing to back. Generalized  bruising.  surgical site will remain free of infection, improvement of MASD, no further skin breakdown  Assess Shin qshift and PRN.     Discharge Planning:  pt set to d/c home with spouse. Pt son provided with [facility] resources. DME ordered. Pt set for OP   Team Discussion: Having intermittent diarrhea, increased Remeron to 30 mg at HS, K+ doing better, Keflex has completed. Continent B/B, mild/mod controlled back pain. Incision to mid back with some steri-strips in place. Patient on target to meet rehab goals: yes, showered with contact guard to min assist. Husband has been here. Walked up to 200 ft. Family education went well. Husband can don/doff brace.   *See Care Plan and progress notes for long and short-term goals.   Revisions to Treatment Plan:  Ready for discharge.  Teaching Needs: Family education, medication management, pain management, skin/wound care, transfer training, gait training, balance training, endurance training, safety awareness.  Current Barriers to Discharge: Decreased caregiver support, Medical stability, Home enviroment access/layout, Wound care, Lack of/limited family support, Weight bearing restrictions, Medication compliance and Behavior  Possible Resolutions to Barriers: Continue current medications, provide emotional support.     Medical Summary Current Status: s/p tx for E COli UTI/ with Keflex; back pain- moderate- with meds doing well; intermittent diarrhea- gets to bathroom- Lomotil prn  Barriers to Discharge: Behavior;Home enviroment access/layout;Weight bearing restrictions;Weight;Wound care  Barriers to Discharge Comments: incision on  back doing better- some steristrips; showering well; husband has done family ed; at goal level with PT and OT Possible Resolutions to [employer] Focus: huge improvement- walking 242ft RW CGA- get a manual w/c and RW since gravel driveway/outside use; d/c 03/21/21- at goal level   Continued Need for Acute Rehabilitation  Level of Care: The patient requires daily medical management by a physician with specialized training in physical medicine and rehabilitation for the following reasons: Direction of a multidisciplinary physical rehabilitation program to maximize functional independence : Yes Medical management of patient stability for increased activity during participation in an intensive rehabilitation regime.: Yes Analysis of laboratory values and/or radiology reports with any subsequent need for medication adjustment and/or medical intervention. : Yes   I attest that I was present, lead the team conference, and concur with the assessment and plan of the team.   Tennis Must 03/20/2021, 5:32 PM

## 2021-03-21 DIAGNOSIS — D62 Acute posthemorrhagic anemia: Secondary | ICD-10-CM

## 2021-03-21 DIAGNOSIS — R197 Diarrhea, unspecified: Secondary | ICD-10-CM

## 2021-03-21 DIAGNOSIS — K529 Noninfective gastroenteritis and colitis, unspecified: Secondary | ICD-10-CM

## 2021-03-21 DIAGNOSIS — Z981 Arthrodesis status: Secondary | ICD-10-CM

## 2021-03-21 DIAGNOSIS — F4323 Adjustment disorder with mixed anxiety and depressed mood: Secondary | ICD-10-CM

## 2021-03-21 MED ORDER — ONDANSETRON HCL 4 MG PO TABS: 4.0000 mg | ORAL_TABLET | Freq: Four times a day (QID) | ORAL | 0 refills | Status: DC | PRN

## 2021-03-21 MED ORDER — FLAVOXATE HCL 100 MG PO TABS: 100.0000 mg | ORAL_TABLET | Freq: Three times a day (TID) | ORAL | 0 refills | Status: DC | PRN

## 2021-03-21 MED ORDER — HYDROCORTISONE 1 % EX CREA: TOPICAL_CREAM | Freq: Two times a day (BID) | CUTANEOUS | 0 refills | Status: DC

## 2021-03-21 MED ORDER — GABAPENTIN 100 MG PO CAPS: 100.0000 mg | ORAL_CAPSULE | Freq: Every day | ORAL | 0 refills | Status: DC

## 2021-03-21 MED ORDER — OXYCODONE HCL 5 MG PO TABS: 2.5000 mg | ORAL_TABLET | Freq: Four times a day (QID) | ORAL | 0 refills | Status: DC | PRN

## 2021-03-21 MED ORDER — METHOCARBAMOL 500 MG PO TABS: 250.0000 mg | ORAL_TABLET | Freq: Three times a day (TID) | ORAL | 0 refills | Status: DC | PRN

## 2021-03-21 MED ORDER — TAMSULOSIN HCL 0.4 MG PO CAPS: 0.8000 mg | ORAL_CAPSULE | Freq: Every day | ORAL | 0 refills | Status: DC

## 2021-03-21 MED ORDER — MIRTAZAPINE 30 MG PO TABS: 30.0000 mg | ORAL_TABLET | Freq: Every day | ORAL | 0 refills | Status: DC

## 2021-03-21 NOTE — Progress Notes (Signed)
PROGRESS NOTE   Subjective/Complaints:  Pt ready for d/c- asking about meds- Oxy- explained it's 7 days Rx initially, but can call clinic to do refill in ~ 6 days.  Also will have our number on paperwork.  Asked for [facility] near [place]? For pharmacy.    ROS:   Pt denies SOB, abd pain, CP, N/V/C/D, and vision changes   Objective:   No results found. Recent Labs    03/19/21 0647  WBC 5.8  HGB 10.8*  HCT 34.8*  PLT 287   Recent Labs    03/20/21 0519  NA 137  K 3.6  CL 105  CO2 23  GLUCOSE 98  BUN 9  CREATININE 0.82  CALCIUM 9.2    Intake/Output Summary (Last 24 hours) at 03/21/2021 0836 Last data filed at 03/21/2021 0734 Gross per 24 hour  Intake 360 ml  Output --  Net 360 ml        Physical Exam: Vital Signs Blood pressure (!) 123/59, pulse 71, temperature 97.9 F (36.6 C), resp. rate 16, height 5\' 3"  (1.6 m), weight 54.3 kg, SpO2 100 %.      General: awake, alert, appropriate,  Sitting up in bed; husband also in room; NAD HENT: conjugate gaze; oropharynx moist CV: regular rate; no JVD Pulmonary: CTA B/L; no W/R/R- good air movement GI: soft, NT, ND, (+)BS Psychiatric: appropriate; asking appropriate questions- less tangential today Neurological: alert  Skin: back incision dressed- C/D/I- removed dressing- red/irritated around incision, likely from itching area- has steristrips- less of them, but still intact.   sensation intact.  MMT 3-4/5 in LE, pain inhibition   Assessment/Plan: 1. Functional deficits which require 3+ hours per day of interdisciplinary therapy in a comprehensive inpatient rehab setting.  Physiatrist is providing close team supervision and 24 hour management of active medical problems listed below.  Physiatrist and rehab team continue to assess barriers to discharge/monitor patient progress toward functional and medical goals  Care Tool:  Bathing    Body  parts bathed by patient: Right arm,Left arm,Chest,Abdomen,Front perineal area,Right upper leg,Left upper leg,Right lower leg,Left lower leg,Face,Buttocks   Body parts bathed by helper: Buttocks     Bathing assist Assist Level: Supervision/Verbal cueing     Upper Body Dressing/Undressing Upper body dressing   What is the patient wearing?: Pull over shirt    Upper body assist Assist Level: Set up assist    Lower Body Dressing/Undressing Lower body dressing      What is the patient wearing?: Underwear/pull up,Skirt     Lower body assist Assist for lower body dressing: Minimal Assistance - Patient > 75%     Toileting Toileting    Toileting assist Assist for toileting: Minimal Assistance - Patient > 75% (Supervision hygiene, Min A for BM)     Transfers Chair/bed transfer  Transfers assist  Chair/bed transfer activity did not occur: Safety/medical concerns  Chair/bed transfer assist level: Contact Guard/Touching assist Chair/bed transfer assistive device:   Ambulation assist   Ambulation activity did not occur: Safety/medical concerns  Assist level: Supervision/Verbal cueing Assistive device: Walker-rolling Max distance: 150   Walk 10 feet activity   Assist  Walk  10 feet activity did not occur: Safety/medical concerns  Assist level: Supervision/Verbal cueing Assistive device: Walker-rolling   Walk 50 feet activity   Assist Walk 50 feet with 2 turns activity did not occur: Safety/medical concerns  Assist level: Supervision/Verbal cueing Assistive device: Walker-rolling    Walk 150 feet activity   Assist Walk 150 feet activity did not occur: Safety/medical concerns  Assist level: Supervision/Verbal cueing Assistive device: Walker-rolling    Walk 10 feet on uneven surface  activity   Assist Walk 10 feet on uneven surfaces activity did not occur: Safety/medical concerns         Wheelchair     Assist Will  patient use wheelchair at discharge?: Yes Type of Wheelchair: Manual Wheelchair activity did not occur: Safety/medical concerns  Wheelchair assist level: Contact Guard/Touching assist Max wheelchair distance: 50    Wheelchair 50 feet with 2 turns activity    Assist    Wheelchair 50 feet with 2 turns activity did not occur: Safety/medical concerns   Assist Level: Minimal Assistance - Patient > 75%   Wheelchair 150 feet activity     Assist  Wheelchair 150 feet activity did not occur: Safety/medical concerns   Assist Level: Total Assistance - Patient < 25%   Blood pressure (!) 123/59, pulse 71, temperature 97.9 F (36.6 C), resp. rate 16, height 5\' 3"  (1.6 m), weight 54.3 kg, SpO2 100 %.  1.  Lumbar burst fracture s/p ORIF T11-L4- B/B issues have resolved/improved.              -ELOS/Goals: minA 2-3 weeks  -5/4- d/c today 2.  Impaired mobility: -DVT/anticoagulation: Continue Lovenox 40 mg daily since renal function fine             -antiplatelet therapy: N/A 3. Pain Management: Oxycodone and robaxin prn.              --Lidocaine patches x3 added  for pain control             --Tylenol 650mg  TID scheduled.   -added scheduled oxycodone 5mg  daily 7:30am and 1:30pm   4/23 added 2.5mg  scheduled at night as well.     5/3- Pain doing better daily- con't regimen   5/4- will send home on Oxycodone prn, 7 day supply- explained should be weaning pain meds over time     Continue prn oxycodone at 2.5mg  q8H prn to limit neurosedation   -Obtained XR right wrist where pain was worst--negative.    - Apply ice. Pain improved with therapy and at rest.  4. Anxiety and depression: LCSW to follow for evaluation and support. Discussed with son her premorbid issues. Discussed starting Remeron for her anxiety and depression and son and husband agreeable. Start 7.5mg  4/20. Well tolerated, conitnue  5/1- bright affect; eating better- sleeping great- continue Remeron  5/2- pt reports appetite  isn't great, but admits much better with Remeron- con't regimen  5/3- hadn't eaten yet, but started by the time I left- "is poor eater" in general- doesn't think her weight has changed much from baseline. Will increase Remeron to appetite stimulant dose 30 mg QHS.               -antipsychotic agents: N/A 5. Neuropsych: This patient may be intermittently capable of making decisions on her own behalf. 6. Skin/Wound Care: Routine pressure relief measures. May dc IV 7. Fluids/Electrolytes/Nutrition: Monitor I/O. Monitor lytes weekly. Eating much better with Remeron! 8. L2 burst fracture: Back precautions with brace when at EOB/OOB 9. Sternal  fracture: Encourage IS.  Continue lidocaine patch.  4/29- stopped lidoderm- didn't think was helpful 10. Neurogenic bladder: voiding well, incontinence, placed order for timed voiding q2H while awake.   4/29- emptying bladder- con't regimen 11. Neurogenic bowel: Was incontinent during the night ( Movantik, miralax and senna taken this am).              -5/4-pt going home with Lomotil prn for occ loose stools  5/3- needing Lomotil prn last 2 days- will monitor since just had PO ABX.   12. Acute blood loss anemia: H/H down to 9.2/94.9-->recheck in am.  13. Hyperglycemia: Likely due to stress--will order A1C for am.  14. Hypocalcemia: Ionized calcium 1.11.             --added calcium supplement.  15. Hypertension: systolic BP mildly elevated, monitor TID.  4/14- BP 120s/70s- con't regimen 15. Baseline: patient used to walk 1-2 miles per day with her husband. Was very active around her home.   16. Hypokalemia  4/12- K+ 3.0- repleted and will recheck next 2 days.  4/13- K+ 3.8 today- will recheck Friday  4/29- Last K+ was 3.4- will replete and recheck Monday   5/2- CMP pending- con't to monitor  5/3- K+ 3.6- con't regimen  5/4- will have her f/u with PCP esp if having a lot of diarrhea.  17. Confusion: improved with decreasing oxycodone.  18. Diarrhea: d/c  dulcolax suppository.  4/27- has lomotil prn- not on any meds scheduled right now 19. Dysuria:  F/u ucx negative. Sx improved  4/25- pt reports still having burning intermittently- but last check showed (+) U/A but no growth- if continues, will resend  4/26- will start Pyridium 100 mg TID x 2 days and recheck U/A and Cx, just to make sure- not sure Cx last week was indicative of Sx's.   4/27- pyridium is helpful- due to (+) U/A- will try Keflex 500 mg BID and wait for Cx.   4/28- Cx so fat shows >100k GNRs- pending sensitivities.   5/2- E coli- sensitive to everything- finished Keflex 20. Itching  4/28- will add cortisone cream around incision, not on incision and d/c dressing since has steristrips in place  4/29-5/1 still itching, but better- continue regimen 21. Disposition: discussed home equipment, safety measures at home to minimize fall risk  5/4- d/c today- went over d/c instructions and PA to go over meds.   LOS: 23 days A FACE TO FACE EVALUATION WAS PERFORMED  Shelby Arellano 03/21/2021, 8:36 AM
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
## 2021-03-21 NOTE — Discharge Instructions (Signed)
Inpatient Rehab Discharge Instructions  Shelby Arellano Discharge date and time:    Activities/Precautions/ Functional Status: Activity: no lifting, driving, or strenuous exercise till cleared by  MD Diet: regular diet Wound Care: keep wound clean and dry Contact Dr. Yetta Barre if you develop any problems with your incision/wound--redness, swelling, increase in pain, drainage or if you develop fever or chills.    Functional status:  ___ No restrictions     ___ Walk up steps independently ___ 24/7 supervision/assistance   ___ Walk up steps with assistance ___ Intermittent supervision/assistance  ___ Bathe/dress independently ___ Walk with walker     ___ Bathe/dress with assistance ___ Walk Independently    ___ Shower independently ___ Walk with assistance    ___ Shower with assistance ___ No alcohol     ___ Return to work/school ________  COMMUNITY REFERRALS UPON DISCHARGE:    Outpatient: PT    OT    ST                Agency: [facility] Phone: [phone]              Appointment Date/Time: TBD by Facility   Medical Equipment/Items Ordered: Wheelchair, Agricultural consultant, Drop Arm Commode, Shower Chair                                                 Agency/Supplier: [employer]  Special Instructions: 1. Need to wear brace if at edge of bed or out of bed.  2. Continue back  Precautions--no bending, twisting, arching or lifting items over 5 lbs. No driving.  3. Need to eat/drink extra milk, OJ or tomato juice (potassium rich foods) on days that you have more than one loose stools. This will help to Keep  your potassium levels stable.     My questions have been answered and I understand these instructions. I will adhere to these goals and the provided educational materials after my discharge from the hospital.  Patient/Caregiver Signature _______________________________ Date __________  Clinician Signature _______________________________________ Date  __________  Please bring this form and your medication list with you to all your follow-up doctor's appointments.

## 2021-03-26 ENCOUNTER — Encounter: Payer: Medicare Other | Admitting: Occupational Therapy

## 2021-03-26 ENCOUNTER — Ambulatory Visit: Payer: Medicare Other | Admitting: Physical Therapy

## 2021-03-28 ENCOUNTER — Ambulatory Visit: Payer: Medicare Other | Attending: Family Medicine | Admitting: Occupational Therapy

## 2021-03-28 ENCOUNTER — Ambulatory Visit: Payer: Medicare Other

## 2021-03-28 ENCOUNTER — Other Ambulatory Visit: Payer: Self-pay

## 2021-03-28 DIAGNOSIS — M545 Low back pain, unspecified: Secondary | ICD-10-CM | POA: Insufficient documentation

## 2021-03-28 DIAGNOSIS — Z981 Arthrodesis status: Secondary | ICD-10-CM | POA: Insufficient documentation

## 2021-03-28 DIAGNOSIS — M6281 Muscle weakness (generalized): Secondary | ICD-10-CM

## 2021-03-28 DIAGNOSIS — R2689 Other abnormalities of gait and mobility: Secondary | ICD-10-CM | POA: Insufficient documentation

## 2021-03-28 DIAGNOSIS — R2681 Unsteadiness on feet: Secondary | ICD-10-CM | POA: Insufficient documentation
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
## 2021-04-11 ENCOUNTER — Other Ambulatory Visit: Payer: Self-pay

## 2021-04-11 ENCOUNTER — Ambulatory Visit: Payer: Medicare Other

## 2021-04-11 ENCOUNTER — Ambulatory Visit: Payer: Medicare Other | Admitting: Occupational Therapy

## 2021-04-11 DIAGNOSIS — R2681 Unsteadiness on feet: Secondary | ICD-10-CM | POA: Diagnosis not present

## 2021-04-11 DIAGNOSIS — Z981 Arthrodesis status: Secondary | ICD-10-CM

## 2021-04-11 DIAGNOSIS — R2689 Other abnormalities of gait and mobility: Secondary | ICD-10-CM

## 2021-04-11 DIAGNOSIS — M6281 Muscle weakness (generalized): Secondary | ICD-10-CM
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
## 2021-04-13 ENCOUNTER — Ambulatory Visit: Payer: Medicare Other

## 2021-04-13 ENCOUNTER — Encounter: Payer: Self-pay | Admitting: Occupational Therapy

## 2021-04-13 ENCOUNTER — Other Ambulatory Visit: Payer: Self-pay

## 2021-04-13 ENCOUNTER — Ambulatory Visit: Payer: Medicare Other | Admitting: Occupational Therapy

## 2021-04-13 DIAGNOSIS — Z981 Arthrodesis status: Secondary | ICD-10-CM

## 2021-04-13 DIAGNOSIS — M6281 Muscle weakness (generalized): Secondary | ICD-10-CM

## 2021-04-13 DIAGNOSIS — R2689 Other abnormalities of gait and mobility: Secondary | ICD-10-CM

## 2021-04-13 DIAGNOSIS — R2681 Unsteadiness on feet: Secondary | ICD-10-CM
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
## 2021-04-15 ENCOUNTER — Other Ambulatory Visit: Payer: Self-pay | Admitting: Physical Medicine and Rehabilitation

## 2021-04-19 ENCOUNTER — Other Ambulatory Visit: Payer: Self-pay

## 2021-04-19 ENCOUNTER — Ambulatory Visit: Payer: Medicare Other | Admitting: Occupational Therapy

## 2021-04-19 ENCOUNTER — Ambulatory Visit: Payer: Medicare Other | Attending: Family Medicine

## 2021-04-19 DIAGNOSIS — R52 Pain, unspecified: Secondary | ICD-10-CM | POA: Diagnosis present

## 2021-04-19 DIAGNOSIS — R2689 Other abnormalities of gait and mobility: Secondary | ICD-10-CM | POA: Insufficient documentation

## 2021-04-19 DIAGNOSIS — M545 Low back pain, unspecified: Secondary | ICD-10-CM

## 2021-04-19 DIAGNOSIS — M6281 Muscle weakness (generalized): Secondary | ICD-10-CM

## 2021-04-19 DIAGNOSIS — R2681 Unsteadiness on feet: Secondary | ICD-10-CM | POA: Diagnosis not present

## 2021-04-19 DIAGNOSIS — Z981 Arthrodesis status: Secondary | ICD-10-CM | POA: Diagnosis present
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
## 2021-04-23 ENCOUNTER — Encounter: Payer: Self-pay | Admitting: Physical Medicine and Rehabilitation

## 2021-04-23 ENCOUNTER — Encounter
Payer: Medicare Other | Attending: Physical Medicine and Rehabilitation | Admitting: Physical Medicine and Rehabilitation

## 2021-04-23 ENCOUNTER — Other Ambulatory Visit: Payer: Self-pay

## 2021-04-23 VITALS — BP 126/81 | HR 76 | Temp 97.9°F | Ht 63.0 in | Wt 119.4 lb

## 2021-04-23 DIAGNOSIS — S32001S Stable burst fracture of unspecified lumbar vertebra, sequela: Secondary | ICD-10-CM | POA: Diagnosis not present

## 2021-04-23 DIAGNOSIS — Z981 Arthrodesis status: Secondary | ICD-10-CM | POA: Insufficient documentation

## 2021-04-23 NOTE — Progress Notes (Signed)
Subjective:    Patient ID: Shelby Arellano, female    DOB: 09-11-1935, 86 y.o.   MRN: [id number]  HPI  Pt is an 86 yr old female with hx of HTN, Hypokalemia; chronic diarrhea since previous Cdiff;  In hospital recently with S/p ORIF T11-L4 for a lumbar L2 burst fx and sternal fx Here for hospital f/u.   Home is a lot nicer than hospital.   Pain is off and on.  Feels like it's more discomfort.   Walking with walking cane now.  Still uses RW occasionally-  Uses w/c if doing a lot outside the home- the store, etc.   Around the house, no assistive device at home- a little discomfort when does that- doesn't furniture walk. Practically normally per husband, but per pt, a little slower.   Reaches for things she shouldn't and then reminded. Walks most days- shorter distance- ~ 1/4 mile most days.   Doing PT  outpt - thinks it's usually helpfu.  Usually 1-2x/week.  Stopped OT- put on hold.  Gave card for Dr Yetta Barre to decide what else to be done. Sees Dr Yetta Barre next week.   Does HEP at least every other day.  Only exercise that's an issue is going from sit to stand without hands- hands on legs and to push up on thighs. Can do 1x no issue, but runs down on pretty fast.   Itching- like mad- was happening in the hospital- but hasn't stopped- really a nuisance- under elastic to bra straps or elastics of waist band.  No lotion/alcohol works long term- but lasts a hours.  Steroid- triamcinolone or cortisone cream didn't do any better than anything else-   Not taking pain meds anymore.  Stopped as soon as got home.         Pain Inventory Average Pain 1 Pain Right Now 1 My pain is no pain  In the last 24 hours, has pain interfered with the following? General activity 0 Relation with others 0 Enjoyment of life 0 What TIME of day is your pain at its worst? varies Sleep (in general) Good  Pain is worse with: bending Pain improves with: rest Relief from Meds: na  walk without  assistance walk with assistance use a cane how many minutes can you walk? 30 ability to climb steps?  no do you drive?  no  retired  No problems in this area  Any changes since last visit?  no  Any changes since last visit?  no  Sees Retail buyer next week, and PCP tomorrow     Family History  Problem Relation Age of Onset  . Heart disease Father    Social History   Socioeconomic History  . Marital status: Married    Spouse name: Not on file  . Number of children: 4  . Years of education: BA  . Highest education level: Not on file  Occupational History  . Occupation: Retired   Tobacco Use  . Smoking status: Never Smoker  . Smokeless tobacco: Never Used  Substance and Sexual Activity  . Alcohol use: No  . Drug use: No  . Sexual activity: Not on file  Other Topics Concern  . Not on file  Social History Narrative   Drinks coffee occasionally    Social Determinants of Health   Financial Resource Strain: Not on file  Food Insecurity: Not on file  Transportation Needs: Not on file  Physical Activity: Not on file  Stress: Not on file  Social Connections: Not on file   Past Surgical History:  Procedure Laterality Date  . APPENDECTOMY  1962  . CARPAL TUNNEL RELEASE  10/12   rt   . CARPAL TUNNEL RELEASE  02/25/2012   Procedure: CARPAL TUNNEL RELEASE;  Surgeon: Nicki Reaper, MD;  Location: [facility];  Service: Orthopedics;  Laterality: Left;  . CATARACT EXTRACTION    . COLONOSCOPY    . EAR BIOPSY     growth lt ear  . HEMORROIDECTOMY  1999  . LUMBAR DISC SURGERY    . WISDOM TOOTH EXTRACTION     Past Medical History:  Diagnosis Date  . Arthritis   . Chronic diarrhea   . Clostridium difficile infection   . Complication of anesthesia    cannot have pentothol  . High cholesterol   . No pertinent past medical history   . Sciatica   . Seborrhea   . Vertigo    BP 126/81   Pulse 76   Temp 97.9 F (36.6 C)   Ht 5\' 3"  (1.6 m)   Wt 119 lb  6.4 oz (54.2 kg)   SpO2 97%   BMI 21.15 kg/m   Opioid Risk Score:   Fall Risk Score:  `1  Depression screen PHQ 2/9  Depression screen PHQ 2/9 04/23/2021  Decreased Interest 0  Down, Depressed, Hopeless 0  PHQ - 2 Score 0  Altered sleeping 0  Tired, decreased energy 0  Change in appetite 0  Feeling bad or failure about yourself  0  Trouble concentrating 0  Moving slowly or fidgety/restless 0  Suicidal thoughts 0  PHQ-9 Score 0    Review of Systems  Constitutional: Positive for unexpected weight change.       Loss  HENT: Negative.   Eyes: Negative.   Respiratory: Negative.   Cardiovascular: Negative.   Gastrointestinal: Positive for diarrhea.  Endocrine: Negative.   Genitourinary: Negative.   Musculoskeletal: Negative.   Skin: Negative.   Allergic/Immunologic: Negative.   Neurological: Negative.   Hematological: Negative.   Psychiatric/Behavioral: Negative.   All other systems reviewed and are negative.      Objective:   Physical Exam  Awake, alert, appropriate, accompanied by husband, has walking cane, NAD Wearing corset- per Dr 06/23/2021- has to wear for comfort MS: UEs- 5/5 in UEs B/L except R finger abd 4+/5 5/5 in LEs B/L - HF/KE/DF and PF Able to do sit to stand- does pretty well x2x, however says she gets exhausted easily.   Neuro: Intact to light touch in LE's B/L  Trace LE edema to ankles B/L     Assessment & Plan:   Pt is an 86 yr old female with hx of HTN, Hypokalemia; chronic diarrhea since previous Cdiff;  In hospital recently with S/P ORIF T11-L4 for a lumbar L2 burst fx and sternal fx Here for hospital f/u.    1. Suggest standing on heels and toes for ~ 5 seconds 2-3x and then can increase as gets better. Please keep doing this exercise.   2. Back off sit to stand exercise- 5-7 repetitions. Then can work back up over next few weeks.   3. Tylenol as needed if in a lot of discomfort. So if >4/10. Suggest tylenol over ibuprofen. Of note,  Kidney function is good.   4.   Would push to walk a little further- so add ~ 2-3 minutes every few days- usually uses walking stick..   5. F/U as needed.   6. Ask about  stool transplant- with GI.   I spent a total of 25 minutes on visit- discussing stool transplant and improving endurance.

## 2021-04-23 NOTE — Patient Instructions (Signed)
Pt is an 86 yr old female with hx of HTN, Hypokalemia; chronic diarrhea since previous Cdiff;  In hospital recently with S/P ORIF T11-L4 for a lumbar L2 burst fx and sternal fx Here for hospital f/u.    1. Suggest standing on heels and toes for ~ 5 seconds 2-3x and then can increase as gets better. Please keep doing this exercise.   2. Back off sit to stand exercise- 5-7 repetitions. Then can work back up over next few weeks.   3. Tylenol as needed if in a lot of discomfort. So if >4/10. Suggest tylenol over ibuprofen. Of note, Kidney function is good.   4.   Would push to walk a little further- so add ~ 2-3 minutes every few days- usually uses walking stick..   5. F/U as needed.   6. Ask about stool transplant? With GI.

## 2021-04-25 ENCOUNTER — Ambulatory Visit: Payer: Medicare Other

## 2021-04-25 ENCOUNTER — Ambulatory Visit: Payer: Medicare Other | Admitting: Occupational Therapy

## 2021-04-25 ENCOUNTER — Encounter: Payer: Medicare Other | Admitting: Occupational Therapy

## 2021-04-25 ENCOUNTER — Other Ambulatory Visit: Payer: Self-pay

## 2021-04-25 DIAGNOSIS — Z981 Arthrodesis status: Secondary | ICD-10-CM

## 2021-04-25 DIAGNOSIS — M6281 Muscle weakness (generalized): Secondary | ICD-10-CM

## 2021-04-25 DIAGNOSIS — R2689 Other abnormalities of gait and mobility: Secondary | ICD-10-CM

## 2021-04-25 DIAGNOSIS — R2681 Unsteadiness on feet: Secondary | ICD-10-CM | POA: Diagnosis not present
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
## 2021-04-27 ENCOUNTER — Other Ambulatory Visit: Payer: Self-pay

## 2021-04-27 ENCOUNTER — Encounter: Payer: Medicare Other | Admitting: Occupational Therapy

## 2021-04-27 ENCOUNTER — Ambulatory Visit: Payer: Medicare Other

## 2021-04-27 DIAGNOSIS — R2681 Unsteadiness on feet: Secondary | ICD-10-CM | POA: Diagnosis not present
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
## 2021-05-02 ENCOUNTER — Ambulatory Visit: Payer: Medicare Other

## 2021-05-02 ENCOUNTER — Other Ambulatory Visit: Payer: Self-pay

## 2021-05-02 ENCOUNTER — Ambulatory Visit: Payer: Medicare Other | Admitting: Occupational Therapy

## 2021-05-02 DIAGNOSIS — Z981 Arthrodesis status: Secondary | ICD-10-CM

## 2021-05-02 DIAGNOSIS — R2689 Other abnormalities of gait and mobility: Secondary | ICD-10-CM

## 2021-05-02 DIAGNOSIS — M6281 Muscle weakness (generalized): Secondary | ICD-10-CM

## 2021-05-02 DIAGNOSIS — M545 Low back pain, unspecified: Secondary | ICD-10-CM

## 2021-05-02 DIAGNOSIS — R2681 Unsteadiness on feet: Secondary | ICD-10-CM

## 2021-05-02 DIAGNOSIS — R52 Pain, unspecified: Secondary | ICD-10-CM
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
## 2021-05-04 ENCOUNTER — Ambulatory Visit: Payer: Medicare Other

## 2021-05-04 ENCOUNTER — Ambulatory Visit: Payer: Medicare Other | Admitting: Occupational Therapy

## 2021-05-04 ENCOUNTER — Other Ambulatory Visit: Payer: Self-pay

## 2021-05-04 DIAGNOSIS — M545 Low back pain, unspecified: Secondary | ICD-10-CM

## 2021-05-04 DIAGNOSIS — R2681 Unsteadiness on feet: Secondary | ICD-10-CM

## 2021-05-04 DIAGNOSIS — R2689 Other abnormalities of gait and mobility: Secondary | ICD-10-CM

## 2021-05-04 DIAGNOSIS — R52 Pain, unspecified: Secondary | ICD-10-CM

## 2021-05-04 DIAGNOSIS — M6281 Muscle weakness (generalized): Secondary | ICD-10-CM

## 2021-05-04 DIAGNOSIS — Z981 Arthrodesis status: Secondary | ICD-10-CM
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
## 2021-05-09 ENCOUNTER — Other Ambulatory Visit: Payer: Self-pay

## 2021-05-09 ENCOUNTER — Ambulatory Visit: Payer: Medicare Other

## 2021-05-09 NOTE — Therapy (Signed)
[facility] [address] [place], [place], [address] Phone: [phone]   Fax:  [phone]  Patient Details  Name: Shelby Arellano MRN: [id number] Date of Birth: [birth year] Referring Provider:  Barbie Banner, MD  Encounter Date: 05/09/2021  Pt did not feel comfortable by treating therapist's chronic cough. No charge for today's visit and patient's remaining appts were scheduled with different therapist. Patient verbalized agreement with plan.  Ileana Ladd, PT 05/09/2021, 11:22 AM  [facility] [address] [place], [place], [address] Phone: [phone]   Fax:  [phone]

## 2021-05-11 ENCOUNTER — Ambulatory Visit: Payer: Medicare Other

## 2021-05-14 ENCOUNTER — Ambulatory Visit: Payer: Medicare Other

## 2021-05-14 ENCOUNTER — Other Ambulatory Visit: Payer: Self-pay

## 2021-05-14 DIAGNOSIS — Z981 Arthrodesis status: Secondary | ICD-10-CM

## 2021-05-14 DIAGNOSIS — R2681 Unsteadiness on feet: Secondary | ICD-10-CM | POA: Diagnosis not present

## 2021-05-14 DIAGNOSIS — M6281 Muscle weakness (generalized): Secondary | ICD-10-CM
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
## 2021-05-16 ENCOUNTER — Ambulatory Visit: Payer: Medicare Other

## 2021-05-18 ENCOUNTER — Ambulatory Visit: Payer: Medicare Other | Attending: Family Medicine

## 2021-05-18 ENCOUNTER — Ambulatory Visit: Payer: Medicare Other

## 2021-05-18 ENCOUNTER — Other Ambulatory Visit: Payer: Self-pay

## 2021-05-18 DIAGNOSIS — Z981 Arthrodesis status: Secondary | ICD-10-CM | POA: Diagnosis present

## 2021-05-18 DIAGNOSIS — M6281 Muscle weakness (generalized): Secondary | ICD-10-CM | POA: Insufficient documentation

## 2021-05-18 DIAGNOSIS — R2681 Unsteadiness on feet: Secondary | ICD-10-CM | POA: Diagnosis present
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
## 2021-05-24 ENCOUNTER — Other Ambulatory Visit: Payer: Self-pay

## 2021-05-24 ENCOUNTER — Ambulatory Visit: Payer: Medicare Other

## 2021-05-24 DIAGNOSIS — R2681 Unsteadiness on feet: Secondary | ICD-10-CM | POA: Diagnosis not present

## 2021-05-24 DIAGNOSIS — M6281 Muscle weakness (generalized): Secondary | ICD-10-CM

## 2021-05-24 DIAGNOSIS — Z981 Arthrodesis status: Secondary | ICD-10-CM
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
## 2021-12-27 ENCOUNTER — Other Ambulatory Visit: Payer: Self-pay | Admitting: Student

## 2021-12-27 DIAGNOSIS — S32002D Unstable burst fracture of unspecified lumbar vertebra, subsequent encounter for fracture with routine healing: Secondary | ICD-10-CM

## 2022-01-04 ENCOUNTER — Other Ambulatory Visit: Payer: Self-pay

## 2022-01-04 ENCOUNTER — Ambulatory Visit
Admission: RE | Admit: 2022-01-04 | Discharge: 2022-01-04 | Disposition: A | Discharge status: Home / Self Care | Payer: Medicare Other | Source: Ambulatory Visit | Attending: Student | Admitting: Student

## 2022-01-04 DIAGNOSIS — S32002D Unstable burst fracture of unspecified lumbar vertebra, subsequent encounter for fracture with routine healing: Secondary | ICD-10-CM

## 2022-01-21 ENCOUNTER — Other Ambulatory Visit: Payer: Medicare Other

## 2022-04-22 ENCOUNTER — Ambulatory Visit: Payer: Medicare Other | Admitting: Family

## 2022-05-22 ENCOUNTER — Encounter: Payer: Medicare Other | Admitting: Family Medicine

## 2022-06-22 IMAGING — CT CT L SPINE W/O CM
3 of 9 series · 9 of 33 positions shown, 10 images · non-contrast
Comparison: Lumbar spine MRI 02/17/2021

CLINICAL DATA: Lumbar burst fracture. Worsening pain. Prior lumbar
fusion.



[Series 5: l-spine 2.00 br40 s3 (person_name) · axial · 0.30mm/px · z∈[+1606,+1606]mm · 1 of 161 slices shown, 2 images]
[im 161/161  soft-tissue]
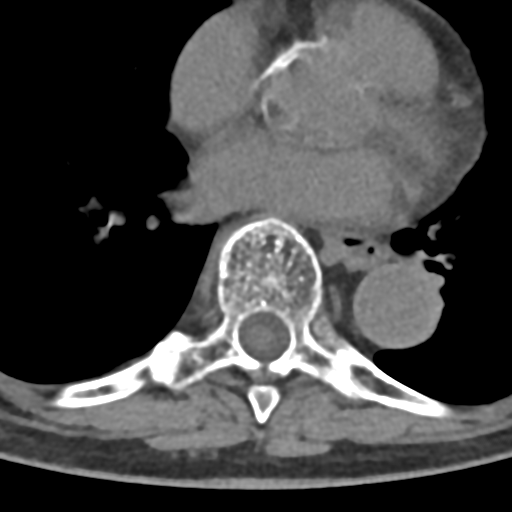
[im 161/161  bone]
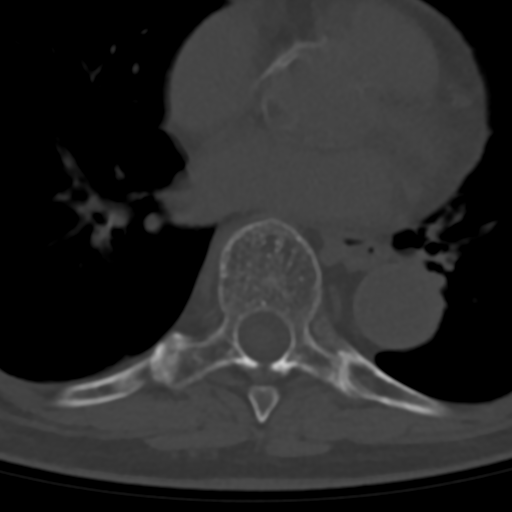

[Series 11: l-spine 2.00 br44 s3 sag soft · sagittal · 0.32mm/px · 5 of 78 slices shown]
[im 26/78  bone]
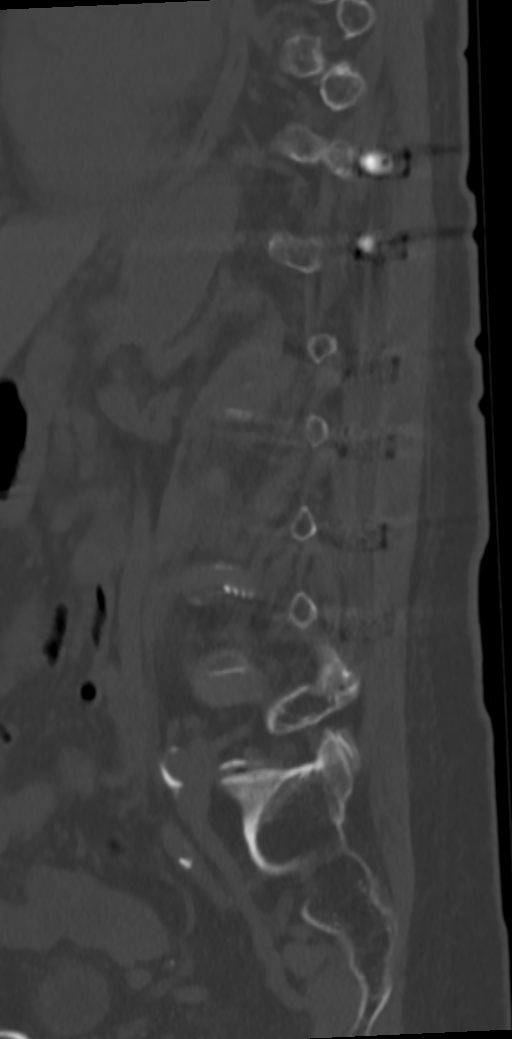
[im 33/78  bone]
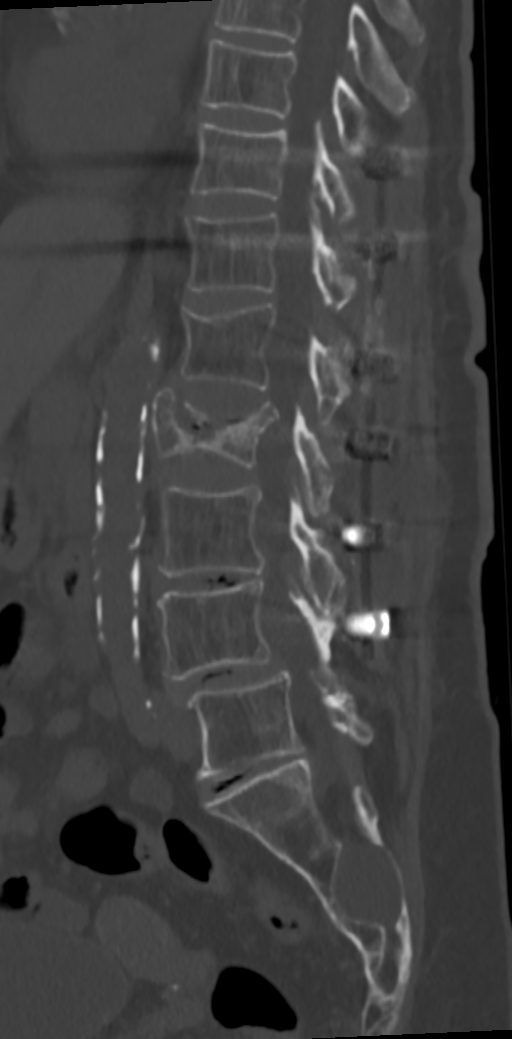
[im 39/78  bone]
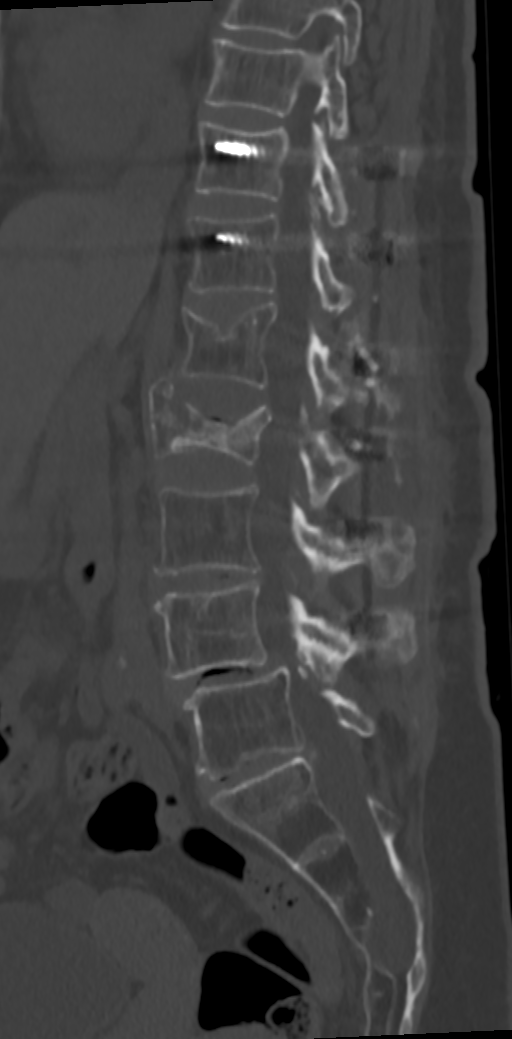
[im 45/78  bone]
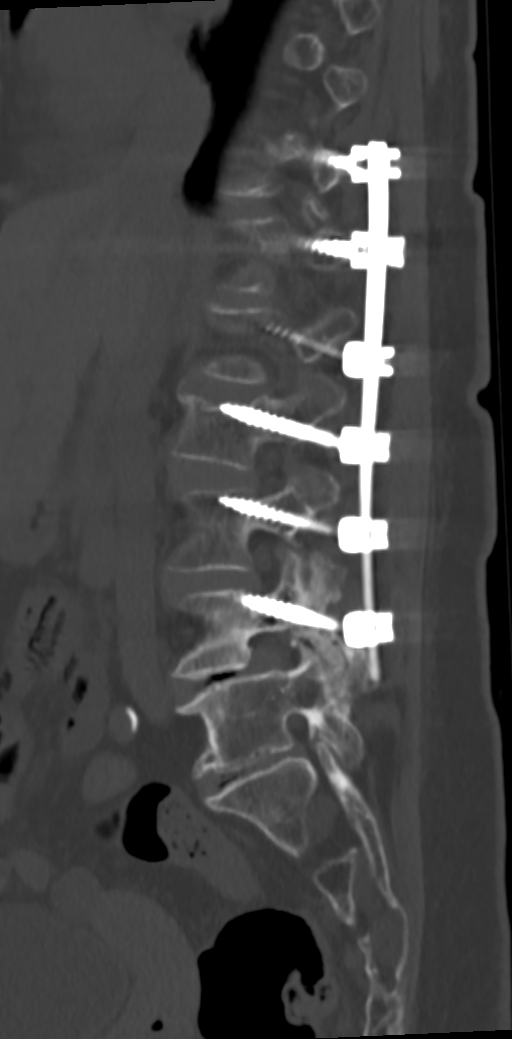
[im 52/78  bone]
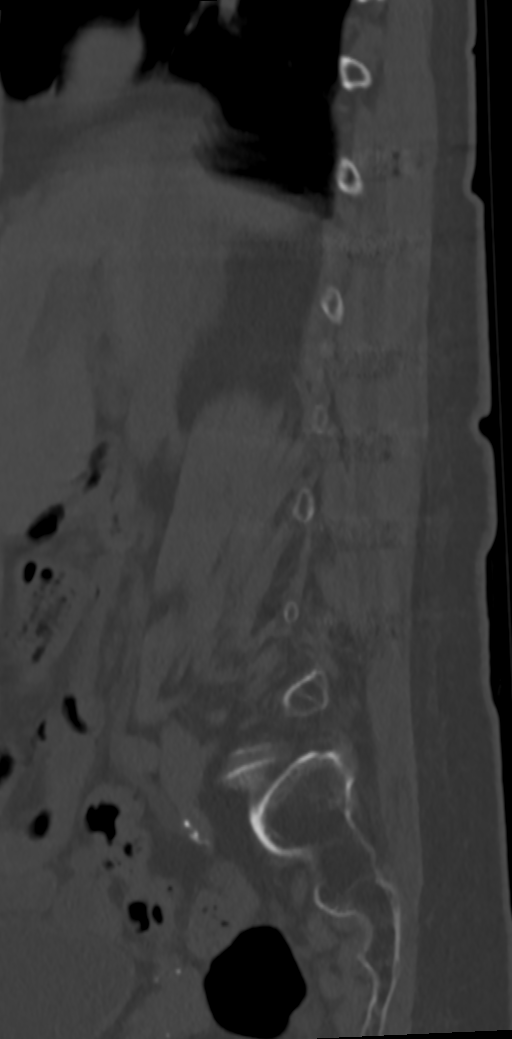

[Series 13: l-spine 2.00 br60 s3 cor bone · coronal · 0.31mm/px · 3 of 81 slices shown]
[im 17/81  bone]
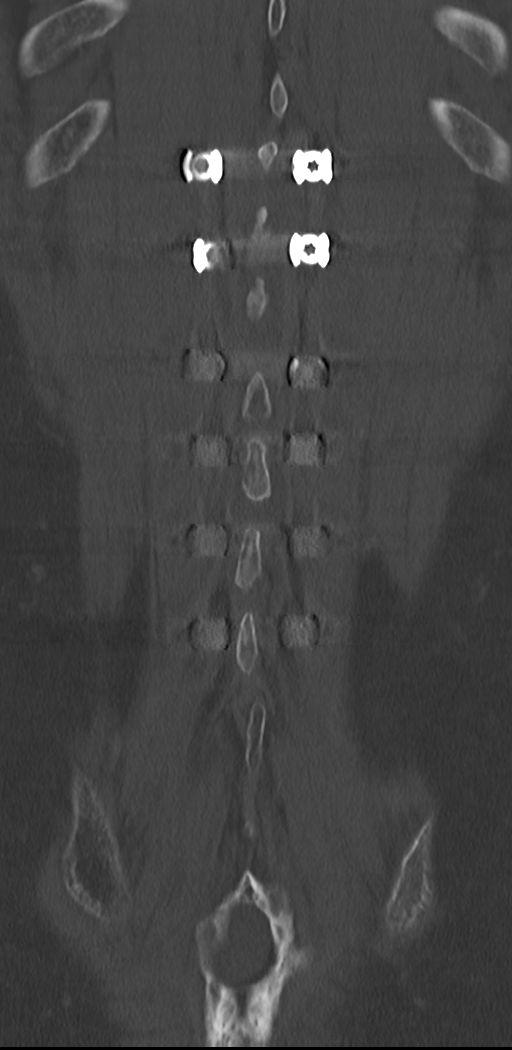
[im 33/81  bone]
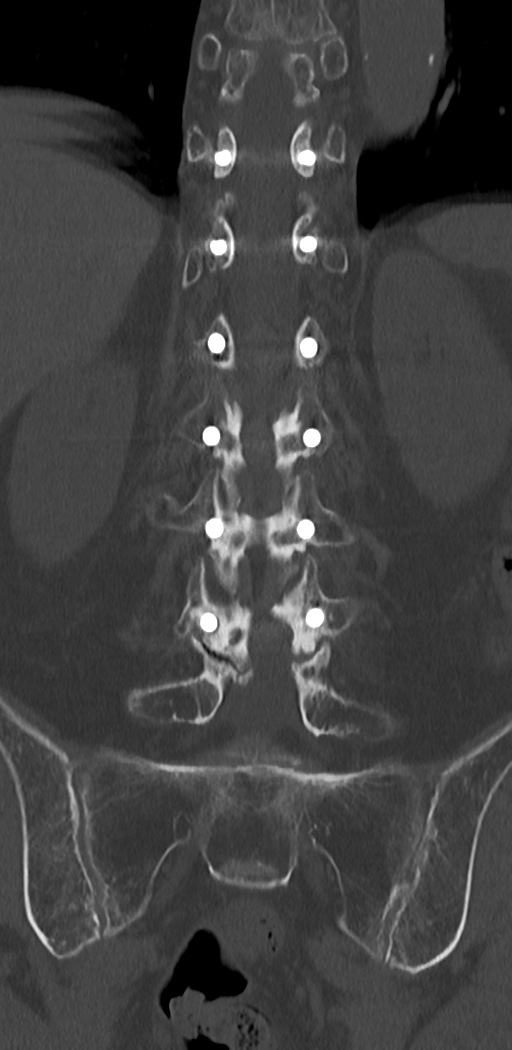
[im 49/81  bone]
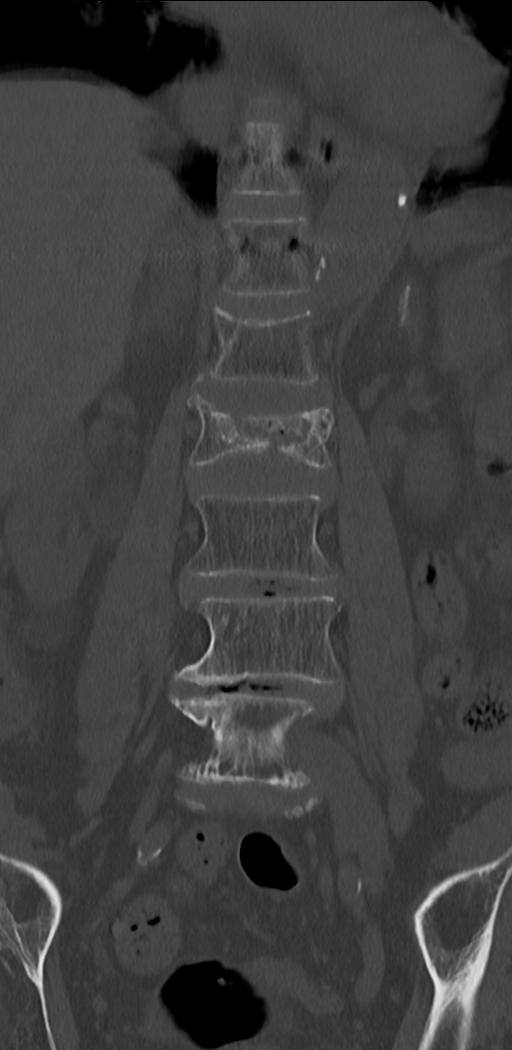

[9 of 33 positions shown; findings below may reference images not displayed]

FINDINGS: Segmentation: 5 lumbar type vertebrae.

Alignment: Straightening of the normal lumbar lordosis. Unchanged 6
mm anterolisthesis of L4 on L5.

Vertebrae: The bones are diffusely osteopenic. Sequelae of interval
T11-L4 posterior fusion are identified with pedicle screws in place
bilaterally at each level. There is slight lucency about the right
L4 pedicle screw which terminates slightly lateral to the vertebral
body. Solid posterior element osseous fusion is not evident at any
of the surgical levels. There is a chronic L2 burst fracture with
severe vertebral body height loss centrally which has mildly
progressed from the prior MRI. Retropulsion of the L2 vertebral body
measures 7 mm, unchanged. A chronic L1 superior endplate compression
fracture is unchanged. Sacral Tarlov cysts are noted.

Paraspinal and other soft tissues: Abdominal aortic atherosclerosis
without aneurysm.

Disc levels:

T11-12: Posterior fusion. Moderate facet arthrosis results in mild
bilateral neural foraminal stenosis. No spinal stenosis.

T12-L1: Posterior fusion.  No stenosis.

L1-2: Left L2 laminectomy. L2 retropulsion results in likely
moderate spinal stenosis. Patent neural foramina.

L2-3: Left L2 laminectomy. Facet hypertrophy without evidence of
significant stenosis.

L3-4: Mild disc space narrowing with vacuum disc. Disc bulging and
moderate facet and ligamentum flavum hypertrophy result in bilateral
lateral recess stenosis and mild left neural foraminal stenosis,
similar to the prior MRI.

L4-5: Prior left laminectomy. Mild disc space narrowing with vacuum
disc. Anterolisthesis with bulging uncovered disc and severe facet
arthrosis result in asymmetric right lateral recess stenosis and
severe right and mild left neural foraminal stenosis with potential
right L4 nerve root impingement. Right neural foraminal stenosis may
have slightly progressed.

L5-S1: Moderate disc space narrowing with vacuum disc. Disc bulging
and mild right and moderate left facet arthrosis result in
borderline to mild bilateral neural foraminal stenosis, similar to
the prior MRI. There is a small partially calcified left paracentral
disc protrusion without significant spinal stenosis, also similar to
the prior MRI.
IMPRESSION: 1. Chronic L2 burst fracture with mild progression of severe
vertebral body height loss and unchanged retropulsion likely
resulting in moderate residual spinal stenosis.
2. Interval T11-L4 posterior fusion.
3. Stable to slight progression of severe right neural foraminal
stenosis at L4-5.
4. Aortic Atherosclerosis ([id number]).

## 2022-12-27 ENCOUNTER — Ambulatory Visit
Admission: RE | Admit: 2022-12-27 | Discharge: 2022-12-27 | Disposition: A | Discharge status: Home / Self Care | Payer: Medicare Other | Source: Ambulatory Visit | Attending: Family Medicine | Admitting: Family Medicine

## 2022-12-27 ENCOUNTER — Other Ambulatory Visit: Payer: Self-pay | Admitting: Family Medicine

## 2022-12-27 DIAGNOSIS — M545 Low back pain, unspecified: Secondary | ICD-10-CM

## 2023-03-06 ENCOUNTER — Encounter: Payer: Self-pay | Admitting: Nurse Practitioner

## 2023-03-06 ENCOUNTER — Encounter: Payer: Medicare Other | Admitting: Nurse Practitioner

## 2023-03-06 VITALS — BP 120/72 | HR 81 | Temp 98.0°F | Resp 20 | Ht 63.0 in | Wt 125.6 lb

## 2023-03-06 DIAGNOSIS — K529 Noninfective gastroenteritis and colitis, unspecified: Secondary | ICD-10-CM

## 2023-03-06 DIAGNOSIS — S32001S Stable burst fracture of unspecified lumbar vertebra, sequela: Secondary | ICD-10-CM

## 2023-03-06 NOTE — Progress Notes (Signed)
This encounter was created in error - please disregard.

## 2023-03-06 NOTE — Assessment & Plan Note (Signed)
Chronic diarrhea, takes Lomotil, Fiber, Probiotics. Obtain CB/diff, CMP/eGFR, TSH, lipid panel.

## 2023-03-06 NOTE — Assessment & Plan Note (Signed)
OA, takes Tylenol. CT 01/04/23 1. Chronic L2 burst fracture with mild progression of severe vertebral body height loss and unchanged retropulsion likely resulting in moderate residual spinal stenosis. 2. Interval T11-L4 posterior fusion. 3. Stable to slight progression of severe right neural foraminal stenosis at L4-5.

## 2023-07-25 ENCOUNTER — Emergency Department (HOSPITAL_BASED_OUTPATIENT_CLINIC_OR_DEPARTMENT_OTHER): Payer: Medicare Other

## 2023-07-25 ENCOUNTER — Other Ambulatory Visit: Payer: Self-pay

## 2023-07-25 ENCOUNTER — Inpatient Hospital Stay (HOSPITAL_BASED_OUTPATIENT_CLINIC_OR_DEPARTMENT_OTHER)
Admission: EM | Admit: 2023-07-25 | Discharge: 2023-07-28 | DRG: 392 | Disposition: A | Discharge status: Home / Self Care | Payer: Medicare Other | Attending: Internal Medicine | Admitting: Internal Medicine

## 2023-07-25 ENCOUNTER — Other Ambulatory Visit (HOSPITAL_BASED_OUTPATIENT_CLINIC_OR_DEPARTMENT_OTHER): Payer: Self-pay

## 2023-07-25 ENCOUNTER — Encounter (HOSPITAL_BASED_OUTPATIENT_CLINIC_OR_DEPARTMENT_OTHER): Payer: Self-pay | Admitting: Emergency Medicine

## 2023-07-25 DIAGNOSIS — Z8249 Family history of ischemic heart disease and other diseases of the circulatory system: Secondary | ICD-10-CM

## 2023-07-25 DIAGNOSIS — K529 Noninfective gastroenteritis and colitis, unspecified: Principal | ICD-10-CM | POA: Diagnosis present

## 2023-07-25 DIAGNOSIS — B9689 Other specified bacterial agents as the cause of diseases classified elsewhere: Secondary | ICD-10-CM | POA: Diagnosis present

## 2023-07-25 DIAGNOSIS — Z881 Allergy status to other antibiotic agents status: Secondary | ICD-10-CM

## 2023-07-25 DIAGNOSIS — Z79899 Other long term (current) drug therapy: Secondary | ICD-10-CM

## 2023-07-25 DIAGNOSIS — R112 Nausea with vomiting, unspecified: Secondary | ICD-10-CM

## 2023-07-25 DIAGNOSIS — R1084 Generalized abdominal pain: Secondary | ICD-10-CM

## 2023-07-25 DIAGNOSIS — E860 Dehydration: Secondary | ICD-10-CM | POA: Diagnosis present

## 2023-07-25 DIAGNOSIS — Z885 Allergy status to narcotic agent status: Secondary | ICD-10-CM

## 2023-07-25 DIAGNOSIS — U099 Post covid-19 condition, unspecified: Secondary | ICD-10-CM | POA: Diagnosis present

## 2023-07-25 DIAGNOSIS — R5381 Other malaise: Secondary | ICD-10-CM | POA: Diagnosis present

## 2023-07-25 DIAGNOSIS — R531 Weakness: Secondary | ICD-10-CM

## 2023-07-25 DIAGNOSIS — F05 Delirium due to known physiological condition: Secondary | ICD-10-CM | POA: Diagnosis not present

## 2023-07-25 DIAGNOSIS — E871 Hypo-osmolality and hyponatremia: Secondary | ICD-10-CM | POA: Diagnosis present

## 2023-07-25 DIAGNOSIS — E876 Hypokalemia: Secondary | ICD-10-CM | POA: Diagnosis present

## 2023-07-25 DIAGNOSIS — Z888 Allergy status to other drugs, medicaments and biological substances status: Secondary | ICD-10-CM

## 2023-07-25 DIAGNOSIS — E7800 Pure hypercholesterolemia, unspecified: Secondary | ICD-10-CM | POA: Diagnosis present

## 2023-07-25 DIAGNOSIS — I452 Bifascicular block: Secondary | ICD-10-CM | POA: Diagnosis present

## 2023-07-25 LAB — COMPREHENSIVE METABOLIC PANEL
ALT: 13 U/L (ref 0–44)
AST: 17 U/L (ref 15–41)
Albumin: 3.7 g/dL (ref 3.5–5.0)
Alkaline Phosphatase: 87 U/L (ref 38–126)
Anion gap: 13 (ref 5–15)
BUN: 13 mg/dL (ref 8–23)
CO2: 23 mmol/L (ref 22–32)
Calcium: 9.1 mg/dL (ref 8.9–10.3)
Chloride: 89 mmol/L — ABNORMAL LOW (ref 98–111)
Creatinine, Ser: 0.93 mg/dL (ref 0.44–1.00)
GFR, Estimated: >60 mL/min (ref >60)
Glucose, Bld: 116 mg/dL — ABNORMAL HIGH (ref 70–99)
Potassium: 3.8 mmol/L (ref 3.5–5.1)
Sodium: 125 mmol/L — ABNORMAL LOW (ref 135–145)
Total Bilirubin: 0.5 mg/dL (ref 0.3–1.2)
Total Protein: 6.9 g/dL (ref 6.5–8.1)

## 2023-07-25 LAB — TROPONIN I (HIGH SENSITIVITY)
Troponin I (High Sensitivity): 5 ng/L (ref <18)
Troponin I (High Sensitivity): 5 ng/L (ref <18)

## 2023-07-25 LAB — CBC
HCT: 41.5 % (ref 36.0–46.0)
Hemoglobin: 14.4 g/dL (ref 12.0–15.0)
MCH: 31 pg (ref 26.0–34.0)
MCHC: 34.7 g/dL (ref 30.0–36.0)
MCV: 89.2 fL (ref 80.0–100.0)
Platelets: 284 10*3/uL (ref 150–400)
RBC: 4.65 MIL/uL (ref 3.87–5.11)
RDW: 13.7 % (ref 11.5–15.5)
WBC: 8.4 10*3/uL (ref 4.0–10.5)
nRBC: 0 % (ref 0.0–0.2)

## 2023-07-25 LAB — URINALYSIS, ROUTINE W REFLEX MICROSCOPIC
Bacteria, UA: NONE SEEN
Bilirubin Urine: NEGATIVE
Glucose, UA: NEGATIVE mg/dL
Ketones, ur: 15 mg/dL — AB
Nitrite: NEGATIVE
Protein, ur: NEGATIVE mg/dL
Specific Gravity, Urine: <1.005 — ABNORMAL LOW (ref 1.005–1.030)
pH: 6.5 (ref 5.0–8.0)

## 2023-07-25 LAB — MAGNESIUM: Magnesium: 1.9 mg/dL (ref 1.7–2.4)

## 2023-07-25 LAB — LIPASE, BLOOD: Lipase: 29 U/L (ref 11–51)

## 2023-07-25 MED ORDER — SODIUM CHLORIDE 0.9 % IV SOLN
3.0000 g | Freq: Once | INTRAVENOUS | Status: AC
  Administered 2023-07-25: 3 g via INTRAVENOUS

## 2023-07-25 MED ORDER — IOHEXOL 300 MG/ML  SOLN
75.0000 mL | Freq: Once | INTRAMUSCULAR | Status: AC | PRN
  Administered 2023-07-25: 75 mL via INTRAVENOUS

## 2023-07-25 MED ORDER — LOPERAMIDE HCL 2 MG PO CAPS: 4.0000 mg | ORAL_CAPSULE | Freq: Once | ORAL | Status: DC

## 2023-07-25 MED ORDER — ONDANSETRON HCL 4 MG/2ML IJ SOLN
4.0000 mg | Freq: Once | INTRAMUSCULAR | Status: AC
  Administered 2023-07-25: 4 mg via INTRAVENOUS
  Filled 2023-07-25: qty 2

## 2023-07-25 MED ORDER — SODIUM CHLORIDE 0.9 % IV BOLUS
1000.0000 mL | Freq: Once | INTRAVENOUS | Status: AC
  Administered 2023-07-25: 1000 mL via INTRAVENOUS

## 2023-07-25 NOTE — ED Notes (Signed)
Pt is concerned she has misplaced her glasses. Pt reports they were around her neck. CT staff reports they did not remove them. This RN does not recall pt having glasses round her neck upon arrival to room 11. Pt husband attempts to reassure pt they are probably home. Pt states they are reading glasses, and she has not been doing any reading here.

## 2023-07-25 NOTE — ED Triage Notes (Signed)
Had covid on 07/15/23  Seen by [facility] took paxlovid.  Feeling better until Tuesday, then fever returned and vomiting  Seen again by [facility] 07/23/23.

## 2023-07-25 NOTE — ED Provider Notes (Signed)
Received signout from previous provider, please see her note for complete H&P.  This is an 88 year old female send history of C. difficile, chronic diarrhea, who presenting to the ED today with complaints of abdominal pain, generalized fatigue and worsening diarrhea for the past several days.  Patient was diagnosed with COVID 10 days ago and finished Paxlovid 4 days ago.  Abdominal discomfort and has been an ongoing issue for the past several days.  A CT scan of abdomen pelvis was obtained independently viewed interpreted by me and remarkable for evidence of ileocolitis.  Agrees with radiology interpretation.  Labs also remarkable for hyponatremia with a sodium of 125.  During this visit patient received IV fluid and antiemetic.  I discussed finding with patient and with husband who is at bedside.  I have consulted hospitalist Dr. Joneen Roach who agrees to admit patient for further managements of colitis and hyponatremia in the setting of lack of oral intake.  BP (!) 158/73   Pulse 90   Temp 99.7 F (37.6 C)   Resp 20   SpO2 100%   Results for orders placed or performed during the hospital encounter of 07/25/23  Lipase, blood  Result Value Ref Range   Lipase 29 11 - 51 U/L  Comprehensive metabolic panel  Result Value Ref Range   Sodium 125 (L) 135 - 145 mmol/L   Potassium 3.8 3.5 - 5.1 mmol/L   Chloride 89 (L) 98 - 111 mmol/L   CO2 23 22 - 32 mmol/L   Glucose, Bld 116 (H) 70 - 99 mg/dL   BUN 13 8 - 23 mg/dL   Creatinine, Ser 4.03 0.44 - 1.00 mg/dL   Calcium 9.1 8.9 - 47.4 mg/dL   Total Protein 6.9 6.5 - 8.1 g/dL   Albumin 3.7 3.5 - 5.0 g/dL   AST 17 15 - 41 U/L   ALT 13 0 - 44 U/L   Alkaline Phosphatase 87 38 - 126 U/L   Total Bilirubin 0.5 0.3 - 1.2 mg/dL   GFR, Estimated >25 >95 mL/min   Anion gap 13 5 - 15  CBC  Result Value Ref Range   WBC 8.4 4.0 - 10.5 K/uL   RBC 4.65 3.87 - 5.11 MIL/uL   Hemoglobin 14.4 12.0 - 15.0 g/dL   HCT 63.8 75.6 - 43.3 %   MCV 89.2 80.0 - 100.0 fL    MCH 31.0 26.0 - 34.0 pg   MCHC 34.7 30.0 - 36.0 g/dL   RDW 29.5 18.8 - 41.6 %   Platelets 284 150 - 400 K/uL   nRBC 0.0 0.0 - 0.2 %  Urinalysis, Routine w reflex microscopic -Urine, Clean Catch  Result Value Ref Range   Color, Urine COLORLESS (A) YELLOW   APPearance CLEAR CLEAR   Specific Gravity, Urine <1.005 (L) 1.005 - 1.030   pH 6.5 5.0 - 8.0   Glucose, UA NEGATIVE NEGATIVE mg/dL   Hgb urine dipstick TRACE (A) NEGATIVE   Bilirubin Urine NEGATIVE NEGATIVE   Ketones, ur 15 (A) NEGATIVE mg/dL   Protein, ur NEGATIVE NEGATIVE mg/dL   Nitrite NEGATIVE NEGATIVE   Leukocytes,Ua SMALL (A) NEGATIVE   RBC / HPF 0-5 0 - 5 RBC/hpf   WBC, UA 6-10 0 - 5 WBC/hpf   Bacteria, UA NONE SEEN NONE SEEN   Squamous Epithelial / HPF 0-5 0 - 5 /HPF  Magnesium  Result Value Ref Range   Magnesium 1.9 1.7 - 2.4 mg/dL  Troponin I (High Sensitivity)  Result Value Ref Range  Troponin I (High Sensitivity) 5 <18 ng/L  Troponin I (High Sensitivity)  Result Value Ref Range   Troponin I (High Sensitivity) 5 <18 ng/L   CT ABDOMEN PELVIS W CONTRAST  Result Date: 07/25/2023 CLINICAL DATA:  Diarrhea, acute nonlocalized abdominal pain. EXAM: CT ABDOMEN AND PELVIS WITH CONTRAST TECHNIQUE: Multidetector CT imaging of the abdomen and pelvis was performed using the standard protocol following bolus administration of intravenous contrast. RADIATION DOSE REDUCTION: This exam was performed according to the departmental dose-optimization program which includes automated exposure control, adjustment of the mA and/or kV according to patient size and/or use of iterative reconstruction technique. CONTRAST:  75mL OMNIPAQUE IOHEXOL 300 MG/ML  SOLN COMPARISON:  None Available. FINDINGS: Lower chest: No acute abnormality. Hepatobiliary: No focal liver abnormality is seen. No gallstones, gallbladder wall thickening, or biliary dilatation. Pancreas: Unremarkable Spleen: Unremarkable Adrenals/Urinary Tract: Adrenal glands are  unremarkable. Kidneys are normal, without renal calculi, focal lesion, or hydronephrosis. Bladder is unremarkable. Stomach/Bowel: There is marked hyperemia, circumferential bowel wall thickening, and surrounding mesenteric edema involving the terminal ileum and continuing to involve the cecum and ascending colon in keeping with changes of an acute infectious or inflammatory ileocolitis. Additionally, there is mucosal hyperemia involving the sigmoid colon and rectum in keeping with an additional focus of a less severe proctocolitis. There is no evidence of obstruction. No free intraperitoneal gas or fluid. No loculated intra-abdominal fluid collections. The appendix is absent. Vascular/Lymphatic: Extensive aortoiliac atherosclerotic calcification. No aortic aneurysm. No pathologic adenopathy within the abdomen and pelvis. Reproductive: Uterus and bilateral adnexa are unremarkable. Other: No abdominal wall hernia. Musculoskeletal: Interval T11-L4 posterior lumbar fusion with instrumentation has been performed with bilateral pedicle screws and posterior bars. Remote appearing compression deformities of L1 and L2 appears stable since prior examination. 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 No acute bone abnormality. No suspicious lytic or blastic bone lesion. IMPRESSION: 1. Acute infectious or inflammatory ileocolitis involving the terminal ileum and continuing to involve the cecum and ascending colon. Additional focus of less severe proctocolitis involving the sigmoid colon and rectum. No evidence of obstruction. No free intraperitoneal gas or fluid to suggest perforation. Aortic Atherosclerosis (ICD10-I70.0). Electronically Signed   By: Helyn Numbers M.D.   On: 07/25/2023 19:27   DG Chest Port 1 View  Result Date: 07/25/2023 CLINICAL DATA:  Fever. EXAM: PORTABLE CHEST 1 VIEW COMPARISON:  X-ray 02/21/2021 FINDINGS: Hyperinflation. No consolidation, pneumothorax or effusion. No edema. Normal cardiopericardial silhouette.  Calcified aorta. Overlapping cardiac leads. Fixation hardware along the thoracolumbar spine at the edge of the imaging field. Osteopenia. IMPRESSION: Hyperinflation.  No acute cardiopulmonary disease. Electronically Signed   By: Karen Kays M.D.   On: 07/25/2023 17:19      Fayrene Helper, PA-C 07/25/23 2125    Virgina Norfolk, DO 07/25/23 2228

## 2023-07-25 NOTE — ED Notes (Signed)
Pt ambulated to the restroom and back to room with even steady slow gait. Pt spouse at the bedside.

## 2023-07-25 NOTE — ED Notes (Signed)
Patient given some water to drink.

## 2023-07-25 NOTE — ED Provider Notes (Signed)
[facility] EMERGENCY DEPARTMENT AT [facility] Provider Note   CSN: [id number] Arrival date & time: 07/25/23  1408     History  No chief complaint on file.   Shelby Arellano is a 88 y.o. female, history of C. difficile, chronic diarrhea, who presents to the ED secondary to weakness, fatigue, and worsening diarrhea for the last 4 days.  Husband provides history given patient's fatigue.  He states that patient had COVID diagnosed about 10 days ago, finished the Paxlovid 4 days ago, when she started having diarrhea the next day after drinking a mocha tea no.  She has had 5+ episodes of diarrhea daily, they have tried Imodium without relief.  Went to see their PCP, and thought it was likely secondary to the [facility],, but the diarrhea has persisted.  She feels very weak, and has had limited p.o. intake.  Denies any shortness of breath, but reports generalized uncomfortable abdominal pain.  She describes as just uncomfortable.  Ate food today, and vomited almost immediately.  Denies any recent antibiotic use  Home Medications Prior to Admission medications   Medication Sig Start Date End Date Taking? Authorizing Provider  acetaminophen (TYLENOL) 325 MG tablet Take 2 tablets (650 mg total) by mouth 3 (three) times daily. Patient not taking: Reported on 03/06/2023 03/19/21   Jacquelynn Cree, PA-C  diphenoxylate-atropine (LOMOTIL) 2.5-0.025 MG tablet Take 1 tablet by mouth in the morning and at bedtime. Patient not taking: Reported on 03/06/2023 12/06/20   [provider]  FIBER PO Take by mouth. Patient not taking: Reported on 03/06/2023    [provider]  hydrocortisone cream 1 % Apply topically 2 (two) times daily. To rash on back ---Not on the incision 03/21/21   Love, Evlyn Kanner, PA-C  loperamide (IMODIUM A-D) 2 MG tablet She takes 1/2 to one tablet twice a day as needed for diarrhea 08/18/22   [provider]  mirtazapine (REMERON) 7.5 MG tablet Take 3.75 mg by mouth at  bedtime. 08/19/22   [provider]  Probiotic Product (PROBIOTIC DAILY) CAPS Take 1 capsule by mouth daily. Patient not taking: Reported on 03/06/2023    [provider]  saccharomyces boulardii (FLORASTOR) 250 MG capsule Take 250 mg by mouth 2 (two) times daily. Patient not taking: Reported on 03/06/2023    [provider]      Allergies    Actonel [risedronate], Ciprofloxacin, Fentanyl, Hydrocodone, Meloxicam, Other, Paroxetine, Phenergan [promethazine], Prednisone, Prochlorperazine edisylate, Tramadol, Trazodone and nefazodone, Ensure [nutritional supplements], Lorazepam, Meclizine, Pantothenic acid, Prochlorperazine maleate, Promethazine hcl, Risedronate sodium, and Bupropion    Review of Systems   Review of Systems  Physical Exam Updated Vital Signs BP (!) 153/82   Pulse 86   Temp 98.3 F (36.8 C)   Resp (!) 23   SpO2 95%  Physical Exam Vitals and nursing note reviewed.  Constitutional:      General: She is not in acute distress.    Appearance: She is well-developed.  HENT:     Head: Normocephalic and atraumatic.     Mouth/Throat:     Mouth: Mucous membranes are dry.  Eyes:     Conjunctiva/sclera: Conjunctivae normal.  Cardiovascular:     Rate and Rhythm: Normal rate and regular rhythm.     Heart sounds: No murmur heard. Pulmonary:     Effort: Pulmonary effort is normal. No respiratory distress.     Breath sounds: Normal breath sounds.  Abdominal:     Palpations: Abdomen is soft.  Tenderness: There is generalized abdominal tenderness. There is no guarding.  Musculoskeletal:        General: No swelling.     Cervical back: Neck supple.  Skin:    General: Skin is warm and dry.     Capillary Refill: Capillary refill takes less than 2 seconds.  Neurological:     Mental Status: She is alert.  Psychiatric:        Mood and Affect: Mood normal.     ED Results / Procedures / Treatments   Labs (all labs ordered are listed, but only  abnormal results are displayed) Labs Reviewed  COMPREHENSIVE METABOLIC PANEL - Abnormal; Notable for the following components:      Result Value   Sodium 125 (*)    Chloride 89 (*)    Glucose, Bld 116 (*)    All other components within normal limits  URINALYSIS, ROUTINE W REFLEX MICROSCOPIC - Abnormal; Notable for the following components:   Color, Urine COLORLESS (*)    Specific Gravity, Urine <1.005 (*)    Hgb urine dipstick TRACE (*)    Ketones, ur 15 (*)    Leukocytes,Ua Maan Zarcone (*)    All other components within normal limits  C DIFFICILE QUICK SCREEN W PCR REFLEX    LIPASE, BLOOD  CBC  MAGNESIUM  TROPONIN I (HIGH SENSITIVITY)  TROPONIN I (HIGH SENSITIVITY)    EKG EKG Interpretation Date/Time:  Friday July 25 2023 15:25:00 EDT Ventricular Rate:  81 PR Interval:  165 QRS Duration:  100 QT Interval:  381 QTC Calculation: 443 R Axis:   -71  Text Interpretation: Sinus rhythm Probable left atrial enlargement Incomplete RBBB and LAFB Abnormal R-wave progression, early transition Left ventricular hypertrophy Confirmed by Virgina Norfolk [phone]) on 07/25/2023 5:38:59 PM  Radiology DG Chest Port 1 View  Result Date: 07/25/2023 CLINICAL DATA:  Fever. EXAM: PORTABLE CHEST 1 VIEW COMPARISON:  X-ray 02/21/2021 FINDINGS: Hyperinflation. No consolidation, pneumothorax or effusion. No edema. Normal cardiopericardial silhouette. Calcified aorta. Overlapping cardiac leads. Fixation hardware along the thoracolumbar spine at the edge of the imaging field. Osteopenia. IMPRESSION: Hyperinflation.  No acute cardiopulmonary disease. Electronically Signed   By: Karen Kays M.D.   On: 07/25/2023 17:19    Procedures Procedures    Medications Ordered in ED Medications  sodium chloride 0.9 % bolus 1,000 mL (0 mLs Intravenous Stopped 07/25/23 1818)  ondansetron (ZOFRAN) injection 4 mg (4 mg Intravenous Given 07/25/23 1539)  iohexol (OMNIPAQUE) 300 MG/ML solution 75 mL (75 mLs Intravenous Contrast  Given 07/25/23 1809)    ED Course/ Medical Decision Making/ A&P                                 Medical Decision Making Patient is a 88 year old female, here for weakness, fatigue, for the last 3 to 4 days after having a mochacchino.  She did have COVID, but improved after Paxlovid.  She has been having diffuse diarrhea, poor p.o. intake, per husband.  She does have a history of C. difficile.  She has generalized abdominal pain, and is chronically ill-appearing.  She has dry mucosal membranes.  We will obtain blood work, CT abdomen pelvis, as well as troponins given weakness.  Amount and/or Complexity of Data Reviewed Labs: ordered.    Details: Hyponatremia of 125, no other electrolyte deficits, other than 89 chloride, no evidence of urinary tract infection, troponin negative Radiology: ordered.    Details: Chest x-ray clear  Discussion of management or test interpretation with external provider(s): Discussed with  Greta Doom PA, he will follow-up on CT abdomen/pelvis, patient likely to be admitted secondary to poor p.o. intake, nausea, vomiting, diarrhea that is intractable, as well as weakness.  Pending CT abdomen pelvis  Risk Prescription drug management.    Final Clinical Impression(s) / ED Diagnoses Final diagnoses:  Hyponatremia  Weakness  Nausea vomiting and diarrhea  Generalized abdominal pain    Rx / DC Orders ED Discharge Orders     None         Chauncey Sciulli, Harley [place], PA 07/25/23 1858    Virgina Norfolk, DO 07/25/23 2228

## 2023-07-26 DIAGNOSIS — Z885 Allergy status to narcotic agent status: Secondary | ICD-10-CM | POA: Diagnosis not present

## 2023-07-26 DIAGNOSIS — Z8249 Family history of ischemic heart disease and other diseases of the circulatory system: Secondary | ICD-10-CM | POA: Diagnosis not present

## 2023-07-26 DIAGNOSIS — Z888 Allergy status to other drugs, medicaments and biological substances status: Secondary | ICD-10-CM | POA: Diagnosis not present

## 2023-07-26 DIAGNOSIS — I452 Bifascicular block: Secondary | ICD-10-CM | POA: Diagnosis present

## 2023-07-26 DIAGNOSIS — K529 Noninfective gastroenteritis and colitis, unspecified: Secondary | ICD-10-CM | POA: Diagnosis present

## 2023-07-26 DIAGNOSIS — Z8616 Personal history of COVID-19: Secondary | ICD-10-CM | POA: Diagnosis not present

## 2023-07-26 DIAGNOSIS — E871 Hypo-osmolality and hyponatremia: Secondary | ICD-10-CM | POA: Diagnosis present

## 2023-07-26 DIAGNOSIS — E876 Hypokalemia: Secondary | ICD-10-CM | POA: Diagnosis present

## 2023-07-26 DIAGNOSIS — B9689 Other specified bacterial agents as the cause of diseases classified elsewhere: Secondary | ICD-10-CM | POA: Diagnosis present

## 2023-07-26 DIAGNOSIS — E7800 Pure hypercholesterolemia, unspecified: Secondary | ICD-10-CM | POA: Diagnosis present

## 2023-07-26 DIAGNOSIS — F05 Delirium due to known physiological condition: Secondary | ICD-10-CM | POA: Diagnosis not present

## 2023-07-26 DIAGNOSIS — R5381 Other malaise: Secondary | ICD-10-CM | POA: Diagnosis present

## 2023-07-26 DIAGNOSIS — Z79899 Other long term (current) drug therapy: Secondary | ICD-10-CM | POA: Diagnosis not present

## 2023-07-26 DIAGNOSIS — U099 Post covid-19 condition, unspecified: Secondary | ICD-10-CM | POA: Diagnosis present

## 2023-07-26 DIAGNOSIS — Z881 Allergy status to other antibiotic agents status: Secondary | ICD-10-CM | POA: Diagnosis not present

## 2023-07-26 DIAGNOSIS — E860 Dehydration: Secondary | ICD-10-CM | POA: Diagnosis present

## 2023-07-26 LAB — CBC WITH DIFFERENTIAL/PLATELET
Abs Immature Granulocytes: 0 10*3/uL (ref 0.00–0.07)
Basophils Absolute: 0 10*3/uL (ref 0.0–0.1)
Basophils Relative: 0 %
Eosinophils Absolute: 0.1 10*3/uL (ref 0.0–0.5)
Eosinophils Relative: 1 %
HCT: 36.2 % (ref 36.0–46.0)
Hemoglobin: 12.5 g/dL (ref 12.0–15.0)
Lymphocytes Relative: 14 %
Lymphs Abs: 1.1 10*3/uL (ref 0.7–4.0)
MCH: 30.4 pg (ref 26.0–34.0)
MCHC: 34.5 g/dL (ref 30.0–36.0)
MCV: 88.1 fL (ref 80.0–100.0)
Monocytes Absolute: 0.7 10*3/uL (ref 0.1–1.0)
Monocytes Relative: 9 %
Neutro Abs: 5.9 10*3/uL (ref 1.7–7.7)
Neutrophils Relative %: 76 %
Platelets: 271 10*3/uL (ref 150–400)
RBC: 4.11 MIL/uL (ref 3.87–5.11)
RDW: 13.5 % (ref 11.5–15.5)
WBC: 7.8 10*3/uL (ref 4.0–10.5)
nRBC: 0 % (ref 0.0–0.2)
nRBC: 0 /100 WBC

## 2023-07-26 LAB — COMPREHENSIVE METABOLIC PANEL
ALT: 16 U/L (ref 0–44)
AST: 21 U/L (ref 15–41)
Albumin: 2.3 g/dL — ABNORMAL LOW (ref 3.5–5.0)
Alkaline Phosphatase: 88 U/L (ref 38–126)
Anion gap: 15 (ref 5–15)
BUN: 8 mg/dL (ref 8–23)
CO2: 19 mmol/L — ABNORMAL LOW (ref 22–32)
Calcium: 8 mg/dL — ABNORMAL LOW (ref 8.9–10.3)
Chloride: 95 mmol/L — ABNORMAL LOW (ref 98–111)
Creatinine, Ser: 0.81 mg/dL (ref 0.44–1.00)
GFR, Estimated: >60 mL/min (ref >60)
Glucose, Bld: 100 mg/dL — ABNORMAL HIGH (ref 70–99)
Potassium: 3.1 mmol/L — ABNORMAL LOW (ref 3.5–5.1)
Sodium: 129 mmol/L — ABNORMAL LOW (ref 135–145)
Total Bilirubin: 0.4 mg/dL (ref 0.3–1.2)
Total Protein: 5.3 g/dL — ABNORMAL LOW (ref 6.5–8.1)

## 2023-07-26 LAB — LACTIC ACID, PLASMA: Lactic Acid, Venous: 0.7 mmol/L (ref 0.5–1.9)

## 2023-07-26 LAB — C DIFFICILE QUICK SCREEN W PCR REFLEX
C Diff antigen: NEGATIVE
C Diff interpretation: NOT DETECTED
C Diff toxin: NEGATIVE

## 2023-07-26 LAB — SODIUM, URINE, RANDOM: Sodium, Ur: <10 mmol/L

## 2023-07-26 LAB — PROCALCITONIN: Procalcitonin: <0.1 ng/mL

## 2023-07-26 LAB — OSMOLALITY: Osmolality: 275 mosm/kg (ref 275–295)

## 2023-07-26 LAB — HEMOGLOBIN A1C
Hgb A1c MFr Bld: 5.9 % — ABNORMAL HIGH (ref 4.8–5.6)
Mean Plasma Glucose: 122.63 mg/dL

## 2023-07-26 LAB — OSMOLALITY, URINE: Osmolality, Ur: 197 mosm/kg — ABNORMAL LOW (ref 300–900)

## 2023-07-26 LAB — MAGNESIUM: Magnesium: 1.8 mg/dL (ref 1.7–2.4)

## 2023-07-26 LAB — C-REACTIVE PROTEIN: CRP: 18.6 mg/dL — ABNORMAL HIGH (ref <1.0)

## 2023-07-26 MED ORDER — OXYCODONE HCL 5 MG PO TABS: 5.0000 mg | ORAL_TABLET | ORAL | Status: DC | PRN

## 2023-07-26 MED ORDER — ONDANSETRON HCL 4 MG PO TABS: 4.0000 mg | ORAL_TABLET | Freq: Four times a day (QID) | ORAL | Status: DC | PRN

## 2023-07-26 MED ORDER — HEPARIN SODIUM (PORCINE) 5000 UNIT/ML IJ SOLN
5000.0000 [IU] | Freq: Three times a day (TID) | INTRAMUSCULAR | Status: DC
  Administered 2023-07-26 – 2023-07-28 (×4): 5000 [IU] via SUBCUTANEOUS
  Filled 2023-07-26 (×5): qty 1

## 2023-07-26 MED ORDER — ONDANSETRON HCL 4 MG/2ML IJ SOLN: 4.0000 mg | Freq: Four times a day (QID) | INTRAMUSCULAR | Status: DC | PRN

## 2023-07-26 MED ORDER — SODIUM CHLORIDE 0.9 % IV SOLN: INTRAVENOUS | Status: DC

## 2023-07-26 MED ORDER — HYDROXYZINE HCL 25 MG PO TABS
25.0000 mg | ORAL_TABLET | Freq: Three times a day (TID) | ORAL | Status: DC | PRN
  Filled 2023-07-26: qty 1

## 2023-07-26 MED ORDER — ACETAMINOPHEN 325 MG PO TABS: 650.0000 mg | ORAL_TABLET | Freq: Four times a day (QID) | ORAL | Status: DC | PRN

## 2023-07-26 MED ORDER — ACETAMINOPHEN 650 MG RE SUPP: 650.0000 mg | Freq: Four times a day (QID) | RECTAL | Status: DC | PRN

## 2023-07-26 MED ORDER — METRONIDAZOLE 500 MG/100ML IV SOLN
500.0000 mg | Freq: Two times a day (BID) | INTRAVENOUS | Status: DC
  Administered 2023-07-26 – 2023-07-27 (×5): 500 mg via INTRAVENOUS
  Filled 2023-07-26 (×6): qty 100

## 2023-07-26 MED ORDER — SODIUM CHLORIDE 0.9 % IV SOLN
2.0000 g | Freq: Every day | INTRAVENOUS | Status: DC
  Administered 2023-07-26 – 2023-07-27 (×3): 2 g via INTRAVENOUS
  Filled 2023-07-26 (×3): qty 20

## 2023-07-26 MED ORDER — ALBUTEROL SULFATE (2.5 MG/3ML) 0.083% IN NEBU: 2.5000 mg | INHALATION_SOLUTION | RESPIRATORY_TRACT | Status: DC | PRN

## 2023-07-26 MED ORDER — POTASSIUM CHLORIDE 20 MEQ PO PACK
40.0000 meq | PACK | Freq: Once | ORAL | Status: AC
  Administered 2023-07-26: 40 meq via ORAL
  Filled 2023-07-26: qty 2

## 2023-07-26 NOTE — Care Plan (Signed)
This 88 years old female with PMH significant for history of C. difficile , chronic diarrhea, hyperlipidemia who was diagnosed with COVID on 8/27 for which she was treated with Paxlovid.  Patient has developed worsening of her chronic diarrhea over the last 4 days and developed fever. She also reports decreased appetite and fatigue with generalized weakness and not feeling well. She has not have any respiratory symptoms.  Workup in the ED reveals CT abdomen and pelvis shows acute infectious or inflammatory ileocolitis involving terminal ileum,  cecum and ascending colon. here is a focus of less severe proctocolitis involving sigmoid colon and rectum.  Patient was also found to have sodium of 125. Patient was admitted for further evaluation. Patient continued on IV fluids and IV antibiotics (ceftriaxone and Flagyl )for intra-abdominal infection.  Patient is awaiting stool studies. Patient reports feeling weak but improving.

## 2023-07-26 NOTE — Plan of Care (Signed)
  Problem: Education: Goal: Knowledge of General Education information will improve Description: Including pain rating scale, medication(s)/side effects and non-pharmacologic comfort measures Outcome: Progressing   Problem: Health Behavior/Discharge Planning: Goal: Ability to manage health-related needs will improve Outcome: Progressing   Problem: Clinical Measurements: Goal: Ability to maintain clinical measurements within normal limits will improve Outcome: Progressing Goal: Will remain free from infection Outcome: Progressing Goal: Diagnostic test results will improve Outcome: Progressing Goal: Respiratory complications will improve Outcome: Progressing Goal: Cardiovascular complication will be avoided Outcome: Progressing   Problem: Activity: Goal: Risk for activity intolerance will decrease Outcome: Progressing   Problem: Nutrition: Goal: Adequate nutrition will be maintained Outcome: Progressing   Problem: Elimination: Goal: Will not experience complications related to bowel motility Outcome: Progressing Goal: Will not experience complications related to urinary retention Outcome: Progressing   Problem: Pain Managment: Goal: General experience of comfort will improve Outcome: Progressing   Problem: Skin Integrity: Goal: Risk for impaired skin integrity will decrease Outcome: Progressing

## 2023-07-26 NOTE — H&P (Signed)
History and Physical    LATESA KURTH ZOX:[id number] DOB: [birth year] DOA: 07/25/2023  PCP: Barbie Banner, MD  Patient coming from: home/[facility]  I have personally briefly reviewed patient's old medical records in [facility]  Chief Complaint: vomiting/ diarrhea /abdominal pain  insetting of covid dx 10 days ago  HPI: RHIANA MACEDO is a 88 y.o. female with medical history significant of  C. difficile, chronic diarrhea, HLD who has interim history of COVID diagnosed on 8/27 for which she was treated with plaxovid. Per notes patient s/p taking course of plaxovid  4-5 days ago had return of fever.Patient also noted worsening of her chronic diarrhea over the last 4 days. She also noted decrease appetite, fatigue, global weakness and generally feeling unwell. She however complained of no respiratory symptoms, sob or chest pain. Due to progression symptoms patient presented to [facility] for evaluation   ED Course:  Vitals: afeb, bp 155/84, hr 94% rr 16, sat 96% on ra In ed  patient was evaluated and found to have on ct Acute infectious or inflammatory ileocolitis involving the terminal ileum and continuing to involve the cecum and ascending colon. Additional focus of less severe proctocolitis involving the sigmoid colon and rectum. Patient on labs also noted to have NA of 125. Patient was then transferred to [facility] for further care  Labs Wbc 8.4, hgb 14.4, plt 284  Lipase 29,  NA 125 ( 137) , k 3.8, cl 89 , cr 0.93 UA: small LE wbc 6-10, no bacteria seen Mag 1.9 CE5,5 Nsr, rbbb, lafb,LVH WJX:[id number]: Hyperinflation.  No acute cardiopulmonary disease.  Urine NA<10 Tx zofran,unasyn Review of Systems: As per HPI otherwise 10 point review of systems negative.   Past Medical History:  Diagnosis Date   Arthritis    Chronic diarrhea    Clostridium difficile infection    Complication of anesthesia    cannot have pentothol   High cholesterol    No pertinent past medical history    Sciatica     Seborrhea    Vertigo     Past Surgical History:  Procedure Laterality Date   APPENDECTOMY  1962   CARPAL TUNNEL RELEASE  10/12   rt    CARPAL TUNNEL RELEASE  02/25/2012   Procedure: CARPAL TUNNEL RELEASE;  Surgeon: Nicki Reaper, MD;  Location: [facility];  Service: Orthopedics;  Laterality: Left;   CATARACT EXTRACTION     COLONOSCOPY     EAR BIOPSY     growth lt ear   HEMORROIDECTOMY  1999   LUMBAR DISC SURGERY     WISDOM TOOTH EXTRACTION       reports that she has never smoked. She has never used smokeless tobacco. She reports that she does not drink alcohol and does not use drugs.  Allergies  Allergen Reactions   Actonel [Risedronate] Other (See Comments)    Panic attacks and dizziness   Ciprofloxacin Other (See Comments)    Exacerbated severe diarrhea Other reaction(s): Aggravates c-diff   Fentanyl Other (See Comments)    Had trouble waking patient from anesthesia as result, given narcan to counteract, had several weeks of dizziness, uncommon fatigue   Hydrocodone Other (See Comments)    disorientation   Meloxicam Other (See Comments)    Disorientation and confusion   Other Other (See Comments)    SODIUM PENTOTHAL-totally disoriented and irrational for about a week afterwards   Paroxetine Other (See Comments)    Hyperactivity-didn't eat, sleep or sit still Other  reaction(s): Loss of consciousness   Phenergan [Promethazine] Diarrhea and Nausea And Vomiting    Didn't work for patient, caused extreme disorientation, violent shaking, hallucinations, inability of recognize husband at that time   Prednisone Other (See Comments)    Hyperactivity-didn't eat, sleep, or sit still Other reaction(s): Hyperactivity   Prochlorperazine Edisylate Other (See Comments)    Didn't work for patient, caused extreme disorientation also, confusion  Other reaction(s): Loss of consciousness   Tramadol Other (See Comments)    Disorientation and confusion   Trazodone And  Nefazodone Other (See Comments)    Extreme dizziness   Ensure [Nutritional Supplements] Diarrhea   Lorazepam Diarrhea   Meclizine Diarrhea   Pantothenic Acid    Prochlorperazine Maleate     Other reaction(s): Loss of consciousness   Promethazine Hcl     Other reaction(s): Loss of consciousness   Risedronate Sodium     Other reaction(s): Panic attacks   Bupropion Anxiety    150 mg.  Lower dose might be OK.    Family History  Problem Relation Age of Onset   Heart disease Father     Prior to Admission medications   Medication Sig Start Date End Date Taking? Authorizing Provider  acetaminophen (TYLENOL) 325 MG tablet Take 2 tablets (650 mg total) by mouth 3 (three) times daily. Patient not taking: Reported on 03/06/2023 03/19/21   Jacquelynn Cree, PA-C  diphenoxylate-atropine (LOMOTIL) 2.5-0.025 MG tablet Take 1 tablet by mouth in the morning and at bedtime. Patient not taking: Reported on 03/06/2023 12/06/20   [provider]  FIBER PO Take by mouth. Patient not taking: Reported on 03/06/2023    [provider]  hydrocortisone cream 1 % Apply topically 2 (two) times daily. To rash on back ---Not on the incision 03/21/21   Love, Evlyn Kanner, PA-C  loperamide (IMODIUM A-D) 2 MG tablet She takes 1/2 to one tablet twice a day as needed for diarrhea 08/18/22   [provider]  mirtazapine (REMERON) 7.5 MG tablet Take 3.75 mg by mouth at bedtime. 08/19/22   [provider]  Probiotic Product (PROBIOTIC DAILY) CAPS Take 1 capsule by mouth daily. Patient not taking: Reported on 03/06/2023    [provider]  saccharomyces boulardii (FLORASTOR) 250 MG capsule Take 250 mg by mouth 2 (two) times daily. Patient not taking: Reported on 03/06/2023    [provider]    Physical Exam: Vitals:   07/25/23 1830 07/25/23 2046 07/25/23 2230 07/26/23 0104  BP: 131/83 (!) 158/73 (!) 145/71   Pulse: 94 90 91   Resp: (!) 21 20 18    Temp:  99.7 F (37.6 C)     SpO2: 100% 100% 96%   Weight:    57.4 kg  Height:    5\' 2"  (1.575 m)    Constitutional: NAD, calm, comfortable Vitals:   07/25/23 1830 07/25/23 2046 07/25/23 2230 07/26/23 0104  BP: 131/83 (!) 158/73 (!) 145/71   Pulse: 94 90 91   Resp: (!) 21 20 18    Temp:  99.7 F (37.6 C)    SpO2: 100% 100% 96%   Weight:    57.4 kg  Height:    5\' 2"  (1.575 m)   Eyes: PERRL, lids and conjunctivae normal ENMT: Mucous membranes are moist. Posterior pharynx clear of any exudate or lesions.Normal dentition.  Neck: normal, supple, no masses, no thyromegaly Respiratory: clear to auscultation bilaterally, no wheezing, no crackles. Normal respiratory effort. No accessory muscle use.  Cardiovascular: Regular rate and  rhythm, no murmurs / rubs / gallops. No extremity edema. 2+ pedal pulses.  Abdomen: +tenderness, no masses palpated. No hepatosplenomegaly. Bowel sounds positive.  Musculoskeletal: no clubbing / cyanosis. No joint deformity upper and lower extremities. Good ROM, no contractures. Normal muscle tone.  Skin: no rashes, lesions, ulcers. No induration Neurologic: CN 2-12 grossly intact. Sensation intact, . Strength 5/5 in all 4.  Psychiatric: Normal judgment and insight. Alert and oriented x 3. Normal mood.    Labs on Admission: I have personally reviewed following labs and imaging studies  CBC: Recent Labs  Lab 07/25/23 1515  WBC 8.4  HGB 14.4  HCT 41.5  MCV 89.2  PLT 284   Basic Metabolic Panel: Recent Labs  Lab 07/25/23 1515 07/25/23 1516  NA 125*  --   K 3.8  --   CL 89*  --   CO2 23  --   GLUCOSE 116*  --   BUN 13  --   CREATININE 0.93  --   CALCIUM 9.1  --   MG  --  1.9   GFR: Estimated Creatinine Clearance: 35 mL/min (by C-G formula based on SCr of 0.93 mg/dL). Liver Function Tests: Recent Labs  Lab 07/25/23 1515  AST 17  ALT 13  ALKPHOS 87  BILITOT 0.5  PROT 6.9  ALBUMIN 3.7   Recent Labs  Lab 07/25/23 1515  LIPASE 29   No results for input(s):  "AMMONIA" in the last 168 hours. Coagulation Profile: No results for input(s): "INR", "PROTIME" in the last 168 hours. Cardiac Enzymes: No results for input(s): "CKTOTAL", "CKMB", "CKMBINDEX", "TROPONINI" in the last 168 hours. BNP (last 3 results) No results for input(s): "PROBNP" in the last 8760 hours. HbA1C: No results for input(s): "HGBA1C" in the last 72 hours. CBG: No results for input(s): "GLUCAP" in the last 168 hours. Lipid Profile: No results for input(s): "CHOL", "HDL", "LDLCALC", "TRIG", "CHOLHDL", "LDLDIRECT" in the last 72 hours. Thyroid Function Tests: No results for input(s): "TSH", "T4TOTAL", "FREET4", "T3FREE", "THYROIDAB" in the last 72 hours. Anemia Panel: No results for input(s): "VITAMINB12", "FOLATE", "FERRITIN", "TIBC", "IRON", "RETICCTPCT" in the last 72 hours. Urine analysis:    Component Value Date/Time   COLORURINE COLORLESS (A) 07/25/2023 1515   APPEARANCEUR CLEAR 07/25/2023 1515   LABSPEC <1.005 (L) 07/25/2023 1515   PHURINE 6.5 07/25/2023 1515   GLUCOSEU NEGATIVE 07/25/2023 1515   HGBUR TRACE (A) 07/25/2023 1515   BILIRUBINUR NEGATIVE 07/25/2023 1515   KETONESUR 15 (A) 07/25/2023 1515   PROTEINUR NEGATIVE 07/25/2023 1515   NITRITE NEGATIVE 07/25/2023 1515   LEUKOCYTESUR SMALL (A) 07/25/2023 1515    Radiological Exams on Admission: CT ABDOMEN PELVIS W CONTRAST  Result Date: 07/25/2023 CLINICAL DATA:  Diarrhea, acute nonlocalized abdominal pain. EXAM: CT ABDOMEN AND PELVIS WITH CONTRAST TECHNIQUE: Multidetector CT imaging of the abdomen and pelvis was performed using the standard protocol following bolus administration of intravenous contrast. RADIATION DOSE REDUCTION: This exam was performed according to the departmental dose-optimization program which includes automated exposure control, adjustment of the mA and/or kV according to patient size and/or use of iterative reconstruction technique. CONTRAST:  75mL OMNIPAQUE IOHEXOL 300 MG/ML  SOLN  COMPARISON:  None Available. FINDINGS: Lower chest: No acute abnormality. Hepatobiliary: No focal liver abnormality is seen. No gallstones, gallbladder wall thickening, or biliary dilatation. Pancreas: Unremarkable Spleen: Unremarkable Adrenals/Urinary Tract: Adrenal glands are unremarkable. Kidneys are normal, without renal calculi, focal lesion, or hydronephrosis. Bladder is unremarkable. Stomach/Bowel: There is marked hyperemia, circumferential bowel wall thickening, and surrounding  mesenteric edema involving the terminal ileum and continuing to involve the cecum and ascending colon in keeping with changes of an acute infectious or inflammatory ileocolitis. Additionally, there is mucosal hyperemia involving the sigmoid colon and rectum in keeping with an additional focus of a less severe proctocolitis. There is no evidence of obstruction. No free intraperitoneal gas or fluid. No loculated intra-abdominal fluid collections. The appendix is absent. Vascular/Lymphatic: Extensive aortoiliac atherosclerotic calcification. No aortic aneurysm. No pathologic adenopathy within the abdomen and pelvis. Reproductive: Uterus and bilateral adnexa are unremarkable. Other: No abdominal wall hernia. Musculoskeletal: Interval T11-L4 posterior lumbar fusion with instrumentation has been performed with bilateral pedicle screws and posterior bars. Remote appearing compression deformities of L1 and L2 appears stable since prior examination. 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 No acute bone abnormality. No suspicious lytic or blastic bone lesion. IMPRESSION: 1. Acute infectious or inflammatory ileocolitis involving the terminal ileum and continuing to involve the cecum and ascending colon. Additional focus of less severe proctocolitis involving the sigmoid colon and rectum. No evidence of obstruction. No free intraperitoneal gas or fluid to suggest perforation. Aortic Atherosclerosis (ICD10-I70.0). Electronically Signed   By: Helyn Numbers  M.D.   On: 07/25/2023 19:27   DG Chest Port 1 View  Result Date: 07/25/2023 CLINICAL DATA:  Fever. EXAM: PORTABLE CHEST 1 VIEW COMPARISON:  X-ray 02/21/2021 FINDINGS: Hyperinflation. No consolidation, pneumothorax or effusion. No edema. Normal cardiopericardial silhouette. Calcified aorta. Overlapping cardiac leads. Fixation hardware along the thoracolumbar spine at the edge of the imaging field. Osteopenia. IMPRESSION: Hyperinflation.  No acute cardiopulmonary disease. Electronically Signed   By: Karen Kays M.D.   On: 07/25/2023 17:19    EKG: Independently reviewed. See above  Assessment/Plan  Acute Ileocolitis - involving the terminal ileum and continuing to involve the cecum and ascending colon -possible related to COVID  vs bacterial infection -f/u with stools studies,  -de-escalate abx as able  -f/u crp/esr/procal -supportive care with antiemetic and pain medication  -patient denies recent use of abx  -does have hx of c-dif.   Chronic diarrhea  -currently on prn lomotil  - no diarrhea today per family  HLD  -diet controlled   Debility due to acute medical illnes -PT/OT    DVT prophylaxis: heparin Code Status: full/ as discussed per patient wishes in event of cardiac arrest  Family Communication: full/ as discussed per patient wishes in event of cardiac arrest  Disposition Plan: patient  expected to be admitted greater than 2 midnights Consult: none Admission status:  med Delton See   Lurline Del MD [facility]   If 7PM-7AM, please contact night-coverage [email or web address] Password [facility]  07/26/2023, 3:53 AM

## 2023-07-27 DIAGNOSIS — E871 Hypo-osmolality and hyponatremia: Secondary | ICD-10-CM

## 2023-07-27 LAB — GASTROINTESTINAL PANEL BY PCR, STOOL (REPLACES STOOL CULTURE)

## 2023-07-27 LAB — MAGNESIUM: Magnesium: 1.9 mg/dL (ref 1.7–2.4)

## 2023-07-27 LAB — PHOSPHORUS: Phosphorus: 2.3 mg/dL — ABNORMAL LOW (ref 2.5–4.6)

## 2023-07-27 MED ORDER — MIRTAZAPINE 15 MG PO TABS
7.5000 mg | ORAL_TABLET | Freq: Every day | ORAL | Status: DC
  Administered 2023-07-27: 7.5 mg via ORAL
  Filled 2023-07-27: qty 1
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
## 2023-07-27 NOTE — Progress Notes (Signed)
PROGRESS NOTE    Shelby Arellano  ZOX:[id number] DOB: [birth year] DOA: 07/25/2023 PCP: Barbie Banner, MD   Brief Narrative:  This 88 years old female with PMH significant for history of C. difficile , chronic diarrhea, hyperlipidemia who was diagnosed with COVID on 8/27 for which she was treated with Paxlovid.  Patient has developed worsening of her chronic diarrhea over the last 4 days and developed fever. She also reports decreased appetite and fatigue with generalized weakness and not feeling well. She has not have any respiratory symptoms.  Workup in the ED reveals CT abdomen and pelvis shows acute infectious or inflammatory ileocolitis involving terminal ileum,  cecum and ascending colon. There is a focus of less severe proctocolitis involving sigmoid colon and rectum.  Patient was also found to have sodium of 125. Patient was admitted for further evaluation. Patient continued on IV fluids and IV antibiotics (ceftriaxone and Flagyl )for intra-abdominal infection.  Patient is awaiting stool studies. Patient reports feeling weak but improving.  Assessment & Plan:   Principal Problem:   Hyponatremia  Acute ileocolitis: Patient presented with nausea, vomiting and diarrhea following COVID infection. CT A/P showed ileocolitis involving terminal ileum and continuing to involve the cecum and ascending colon. It is possibly related to COVID vs bacterial infection. Stool for C. difficile negative. Continue ceftriaxone and Flagyl. De-escalate antibiotics as able to. Lactic acid 0.7, procalcitonin 0.10, CRP 18.6 Continue supportive care with antiemetics and pain medicines. Patient denies recent use of abx .  Hyponatremia: Likely secondary to dehydration in the setting of  diarrhea. Continue IV hydration.  Serum sodium improving.  Hypokalemia: Replaced.  Continue to monitor   Chronic diarrhea : -Currently on prn lomotil  -Patient reports no diarrhea today.   Hyperlipidemia: -diet  controlled    Debility due to acute medical illnes: -PT/OT    DVT prophylaxis:  Heparin sq Code Status: Full code Family Communication: Husband at bed side Disposition Plan:     Status is: Inpatient Remains inpatient appropriate because: Admitted  for Nausea, vomiting and diarrhea found to have ileocolitis started on empiric antibiotic ceftriaxone and Flagyl.    Consultants:  None  Procedures:CTA/P  Antimicrobials:  Anti-infectives (From admission, onward)    Start     Dose/Rate Route Frequency Ordered Stop   07/26/23 0130  metroNIDAZOLE (FLAGYL) IVPB 500 mg        500 mg 100 mL/hr over 60 Minutes Intravenous 2 times daily 07/26/23 0107     07/26/23 0130  cefTRIAXone (ROCEPHIN) 2 g in sodium chloride 0.9 % 100 mL IVPB        2 g 200 mL/hr over 30 Minutes Intravenous Daily at bedtime 07/26/23 0107     07/25/23 2045  Ampicillin-Sulbactam (UNASYN) 3 g in sodium chloride 0.9 % 100 mL IVPB        3 g 200 mL/hr over 30 Minutes Intravenous  Once 07/25/23 2032 07/25/23 2223      Subjective: Patient was seen and examined at bedside.  Overnight events noted. Overnight she had an episode where she was confused likely sundowning. Patient at baseline at this time.   Objective: Vitals:   07/26/23 2038 07/27/23 0037 07/27/23 0537 07/27/23 0728  BP: 126/60 (!) 158/79 131/74 139/62  Pulse: 74 82 80 78  Resp: 18 17 18 16   Temp: 99.3 F (37.4 C) 99.1 F (37.3 C) 98.3 F (36.8 C) 98.4 F (36.9 C)  TempSrc: Oral Oral Oral Oral  SpO2: 97% 96% 99% 97%  Weight:  Height:       No intake or output data in the 24 hours ending 07/27/23 1135 Filed Weights   07/26/23 0104 07/26/23 0522  Weight: 57.4 kg 57.6 kg    Examination:  General exam: Appears calm and comfortable , deconditioned, not in any acute distress Respiratory system: Clear to auscultation. Respiratory effort normal.  RR 15 Cardiovascular system: S1 & S2 heard, regular rate and rhythm, no murmur.  No pedal  edema. Gastrointestinal system: Abdomen is nondistended, soft and nontender. Normal bowel sounds heard. Central nervous system: Alert and oriented x3. No focal neurological deficits. Extremities: Symmetric 5 x 5 power. Skin: No rashes, lesions or ulcers Psychiatry: Judgement and insight appear normal. Mood & affect appropriate.     Data Reviewed: I have personally reviewed following labs and imaging studies  CBC: Recent Labs  Lab 07/25/23 1515 07/26/23 0727  WBC 8.4 7.8  NEUTROABS  --  5.9  HGB 14.4 12.5  HCT 41.5 36.2  MCV 89.2 88.1  PLT 284 271   Basic Metabolic Panel: Recent Labs  Lab 07/25/23 1515 07/25/23 1516 07/26/23 0727 07/27/23 0755  NA 125*  --  129*  --   K 3.8  --  3.1*  --   CL 89*  --  95*  --   CO2 23  --  19*  --   GLUCOSE 116*  --  100*  --   BUN 13  --  8  --   CREATININE 0.93  --  0.81  --   CALCIUM 9.1  --  8.0*  --   MG  --  1.9 1.8 1.9  PHOS  --   --   --  2.3*   GFR: Estimated Creatinine Clearance: 40.2 mL/min (by C-G formula based on SCr of 0.81 mg/dL). Liver Function Tests: Recent Labs  Lab 07/25/23 1515 07/26/23 0727  AST 17 21  ALT 13 16  ALKPHOS 87 88  BILITOT 0.5 0.4  PROT 6.9 5.3*  ALBUMIN 3.7 2.3*   Recent Labs  Lab 07/25/23 1515  LIPASE 29   No results for input(s): "AMMONIA" in the last 168 hours. Coagulation Profile: No results for input(s): "INR", "PROTIME" in the last 168 hours. Cardiac Enzymes: No results for input(s): "CKTOTAL", "CKMB", "CKMBINDEX", "TROPONINI" in the last 168 hours. BNP (last 3 results) No results for input(s): "PROBNP" in the last 8760 hours. HbA1C: Recent Labs    07/26/23 0727  HGBA1C 5.9*   CBG: No results for input(s): "GLUCAP" in the last 168 hours. Lipid Profile: No results for input(s): "CHOL", "HDL", "LDLCALC", "TRIG", "CHOLHDL", "LDLDIRECT" in the last 72 hours. Thyroid Function Tests: No results for input(s): "TSH", "T4TOTAL", "FREET4", "T3FREE", "THYROIDAB" in the last 72  hours. Anemia Panel: No results for input(s): "VITAMINB12", "FOLATE", "FERRITIN", "TIBC", "IRON", "RETICCTPCT" in the last 72 hours. Sepsis Labs: Recent Labs  Lab 07/26/23 0727  PROCALCITON <0.10  LATICACIDVEN 0.7    Recent Results (from the past 240 hour(s))  Gastrointestinal Panel by PCR , Stool     Status: None   Collection Time: 07/26/23  1:04 AM   Specimen: Stool  Result Value Ref Range Status   Campylobacter species NOT DETECTED NOT DETECTED Final   Plesimonas shigelloides NOT DETECTED NOT DETECTED Final   Salmonella species NOT DETECTED NOT DETECTED Final   Yersinia enterocolitica NOT DETECTED NOT DETECTED Final   Vibrio species NOT DETECTED NOT DETECTED Final   Vibrio cholerae NOT DETECTED NOT DETECTED Final   Enteroaggregative E coli (EAEC)  NOT DETECTED NOT DETECTED Final   Enteropathogenic E coli (EPEC) NOT DETECTED NOT DETECTED Final   Enterotoxigenic E coli (ETEC) NOT DETECTED NOT DETECTED Final   Shiga like toxin producing E coli (STEC) NOT DETECTED NOT DETECTED Final   Shigella/Enteroinvasive E coli (EIEC) NOT DETECTED NOT DETECTED Final   Cryptosporidium NOT DETECTED NOT DETECTED Final   Cyclospora cayetanensis NOT DETECTED NOT DETECTED Final   Entamoeba histolytica NOT DETECTED NOT DETECTED Final   Giardia lamblia NOT DETECTED NOT DETECTED Final   Adenovirus F40/41 NOT DETECTED NOT DETECTED Final   Astrovirus NOT DETECTED NOT DETECTED Final   Norovirus GI/GII NOT DETECTED NOT DETECTED Final   Rotavirus A NOT DETECTED NOT DETECTED Final   Sapovirus (I, II, IV, and V) NOT DETECTED NOT DETECTED Final    Comment: Performed at [facility], [address] [place], [place] [address]  C Difficile Quick Screen w PCR reflex     Status: None   Collection Time: 07/26/23  1:04 AM   Specimen: Stool  Result Value Ref Range Status   C Diff antigen NEGATIVE NEGATIVE Final   C Diff toxin NEGATIVE NEGATIVE Final   C Diff interpretation No C. difficile detected.   Final    Comment: Performed at [facility], [address] [place], [place] [address]    Radiology Studies: CT ABDOMEN PELVIS W CONTRAST  Result Date: 07/25/2023 CLINICAL DATA:  Diarrhea, acute nonlocalized abdominal pain. EXAM: CT ABDOMEN AND PELVIS WITH CONTRAST TECHNIQUE: Multidetector CT imaging of the abdomen and pelvis was performed using the standard protocol following bolus administration of intravenous contrast. RADIATION DOSE REDUCTION: This exam was performed according to the departmental dose-optimization program which includes automated exposure control, adjustment of the mA and/or kV according to patient size and/or use of iterative reconstruction technique. CONTRAST:  75mL OMNIPAQUE IOHEXOL 300 MG/ML  SOLN COMPARISON:  None Available. FINDINGS: Lower chest: No acute abnormality. Hepatobiliary: No focal liver abnormality is seen. No gallstones, gallbladder wall thickening, or biliary dilatation. Pancreas: Unremarkable Spleen: Unremarkable Adrenals/Urinary Tract: Adrenal glands are unremarkable. Kidneys are normal, without renal calculi, focal lesion, or hydronephrosis. Bladder is unremarkable. Stomach/Bowel: There is marked hyperemia, circumferential bowel wall thickening, and surrounding mesenteric edema involving the terminal ileum and continuing to involve the cecum and ascending colon in keeping with changes of an acute infectious or inflammatory ileocolitis. Additionally, there is mucosal hyperemia involving the sigmoid colon and rectum in keeping with an additional focus of a less severe proctocolitis. There is no evidence of obstruction. No free intraperitoneal gas or fluid. No loculated intra-abdominal fluid collections. The appendix is absent. Vascular/Lymphatic: Extensive aortoiliac atherosclerotic calcification. No aortic aneurysm. No pathologic adenopathy within the abdomen and pelvis. Reproductive: Uterus and bilateral adnexa are unremarkable. Other: No abdominal wall hernia.  Musculoskeletal: Interval T11-L4 posterior lumbar fusion with instrumentation has been performed with bilateral pedicle screws and posterior bars. Remote appearing compression deformities of L1 and L2 appears stable since prior examination. 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 No acute bone abnormality. No suspicious lytic or blastic bone lesion. IMPRESSION: 1. Acute infectious or inflammatory ileocolitis involving the terminal ileum and continuing to involve the cecum and ascending colon. Additional focus of less severe proctocolitis involving the sigmoid colon and rectum. No evidence of obstruction. No free intraperitoneal gas or fluid to suggest perforation. Aortic Atherosclerosis (ICD10-I70.0). Electronically Signed   By: Helyn Numbers M.D.   On: 07/25/2023 19:27   DG Chest Port 1 View  Result Date: 07/25/2023 CLINICAL DATA:  Fever. EXAM: PORTABLE CHEST 1 VIEW COMPARISON:  X-ray 02/21/2021 FINDINGS: Hyperinflation. No consolidation, pneumothorax or effusion. No edema. Normal cardiopericardial silhouette. Calcified aorta. Overlapping cardiac leads. Fixation hardware along the thoracolumbar spine at the edge of the imaging field. Osteopenia. IMPRESSION: Hyperinflation.  No acute cardiopulmonary disease. Electronically Signed   By: Karen Kays M.D.   On: 07/25/2023 17:19    Scheduled Meds:  heparin  5,000 Units Subcutaneous Q8H   loperamide  4 mg Oral Once   Continuous Infusions:  sodium chloride 75 mL/hr at 07/27/23 1002   cefTRIAXone (ROCEPHIN)  IV 2 g (07/26/23 2131)   metronidazole 500 mg (07/27/23 1006)     LOS: 1 day    Time spent: 50 mins    Willeen Niece, MD [facility]   If 7PM-7AM, please contact night-coverage

## 2023-07-28 DIAGNOSIS — E871 Hypo-osmolality and hyponatremia: Secondary | ICD-10-CM | POA: Diagnosis not present

## 2023-07-28 LAB — BASIC METABOLIC PANEL
Anion gap: 8 (ref 5–15)
Anion gap: 7 (ref 5–15)
BUN: <5 mg/dL — ABNORMAL LOW (ref 8–23)
BUN: <5 mg/dL — ABNORMAL LOW (ref 8–23)
CO2: 20 mmol/L — ABNORMAL LOW (ref 22–32)
CO2: 20 mmol/L — ABNORMAL LOW (ref 22–32)
Calcium: 7.8 mg/dL — ABNORMAL LOW (ref 8.9–10.3)
Calcium: 7.6 mg/dL — ABNORMAL LOW (ref 8.9–10.3)
Chloride: 105 mmol/L (ref 98–111)
Chloride: 104 mmol/L (ref 98–111)
Creatinine, Ser: 0.73 mg/dL (ref 0.44–1.00)
Creatinine, Ser: 0.73 mg/dL (ref 0.44–1.00)
GFR, Estimated: >60 mL/min (ref >60)
GFR, Estimated: >60 mL/min (ref >60)
Glucose, Bld: 107 mg/dL — ABNORMAL HIGH (ref 70–99)
Glucose, Bld: 124 mg/dL — ABNORMAL HIGH (ref 70–99)
Potassium: 2.7 mmol/L — CL (ref 3.5–5.1)
Potassium: 3.5 mmol/L (ref 3.5–5.1)
Sodium: 133 mmol/L — ABNORMAL LOW (ref 135–145)
Sodium: 131 mmol/L — ABNORMAL LOW (ref 135–145)

## 2023-07-28 LAB — MAGNESIUM: Magnesium: 1.8 mg/dL (ref 1.7–2.4)

## 2023-07-28 LAB — PHOSPHORUS: Phosphorus: 2.1 mg/dL — ABNORMAL LOW (ref 2.5–4.6)

## 2023-07-28 MED ORDER — MAGNESIUM SULFATE 2 GM/50ML IV SOLN
2.0000 g | Freq: Once | INTRAVENOUS | Status: AC
  Administered 2023-07-28: 2 g via INTRAVENOUS
  Filled 2023-07-28: qty 50

## 2023-07-28 MED ORDER — POTASSIUM CHLORIDE CRYS ER 20 MEQ PO TBCR
40.0000 meq | EXTENDED_RELEASE_TABLET | Freq: Once | ORAL | Status: AC
  Administered 2023-07-28: 40 meq via ORAL
  Filled 2023-07-28: qty 2

## 2023-07-28 MED ORDER — AMOXICILLIN-POT CLAVULANATE 875-125 MG PO TABS
1.0000 | ORAL_TABLET | Freq: Two times a day (BID) | ORAL | Status: DC
  Administered 2023-07-28: 1 via ORAL
  Filled 2023-07-28: qty 1

## 2023-07-28 MED ORDER — AMOXICILLIN-POT CLAVULANATE 875-125 MG PO TABS: 1.0000 | ORAL_TABLET | Freq: Two times a day (BID) | ORAL | 0 refills | Status: AC

## 2023-07-28 MED ORDER — POTASSIUM CHLORIDE CRYS ER 10 MEQ PO TBCR: 40.0000 meq | EXTENDED_RELEASE_TABLET | Freq: Every day | ORAL | 0 refills | Status: AC

## 2023-07-28 MED ORDER — POTASSIUM CHLORIDE CRYS ER 20 MEQ PO TBCR: 40.0000 meq | EXTENDED_RELEASE_TABLET | Freq: Once | ORAL | Status: DC

## 2023-07-28 MED ORDER — POTASSIUM CHLORIDE CRYS ER 20 MEQ PO TBCR
40.0000 meq | EXTENDED_RELEASE_TABLET | Freq: Every day | ORAL | Status: DC
  Administered 2023-07-28: 40 meq via ORAL
  Filled 2023-07-28: qty 2

## 2023-07-28 NOTE — Plan of Care (Signed)
  Problem: Clinical Measurements: Goal: Ability to maintain clinical measurements within normal limits will improve Outcome: Progressing Goal: Will remain free from infection Outcome: Progressing Goal: Diagnostic test results will improve Outcome: Progressing Goal: Respiratory complications will improve Outcome: Progressing Goal: Cardiovascular complication will be avoided Outcome: Progressing   Problem: Activity: Goal: Risk for activity intolerance will decrease Outcome: Progressing   Problem: Nutrition: Goal: Adequate nutrition will be maintained Outcome: Progressing   Problem: Elimination: Goal: Will not experience complications related to urinary retention Outcome: Progressing   Problem: Pain Managment: Goal: General experience of comfort will improve Outcome: Progressing   Problem: Safety: Goal: Ability to remain free from injury will improve Outcome: Progressing   Problem: Skin Integrity: Goal: Risk for impaired skin integrity will decrease Outcome: Progressing   Problem: Education: Goal: Knowledge of General Education information will improve Description: Including pain rating scale, medication(s)/side effects and non-pharmacologic comfort measures Outcome: Not Progressing   Problem: Health Behavior/Discharge Planning: Goal: Ability to manage health-related needs will improve Outcome: Not Progressing   Problem: Coping: Goal: Level of anxiety will decrease Outcome: Not Progressing   Problem: Elimination: Goal: Will not experience complications related to bowel motility Outcome: Not Progressing

## 2023-07-28 NOTE — Discharge Summary (Signed)
Physician Discharge Summary  Shelby Arellano:[id number] DOB: January 03, 1935 DOA: 07/25/2023  PCP: Barbie Banner, MD  Admit date: 07/25/2023 Discharge date: 07/28/2023  Admitted From:  Discharge disposition: ***   Recommendations for Outpatient Follow-Up:   *** (include homehealth, outpatient follow-up instructions, specific recommendations for PCP to follow-up on, etc.) LACE score = ***, corresponding to a *** % of re-admission within 30 days.   (http://tools.farmacologiaclinica.info/index.php?sid=10044)   Discharge Diagnosis:   Principal Problem:   Hyponatremia    Discharge Condition: Improved.  Diet recommendation: Low sodium, heart healthy.  Carbohydrate-modified.  Regular.  Wound care: None.  Code status: Full.   History of Present Illness:   ***   Hospital Course by Problem:   ***    Medical Consultants:      Discharge Exam:   Vitals:   07/28/23 0508 07/28/23 0837  BP: (!) 145/70 (!) 153/70  Pulse: 76 71  Resp: 18 18  Temp: 98.2 F (36.8 C) 98.1 F (36.7 C)  SpO2: 98% 98%   Vitals:   07/27/23 2104 07/28/23 0112 07/28/23 0508 07/28/23 0837  BP: (!) 134/55 (!) 156/77 (!) 145/70 (!) 153/70  Pulse: 71 70 76 71  Resp: 18 18 18 18   Temp: 98.2 F (36.8 C) 98.2 F (36.8 C) 98.2 F (36.8 C) 98.1 F (36.7 C)  TempSrc: Oral Oral Oral Oral  SpO2: 98% 99% 98% 98%  Weight:      Height:        General exam: Appears calm and comfortable. *** Respiratory system: Clear to auscultation. Respiratory effort normal. Cardiovascular system: S1 & S2 heard, RRR. No JVD,  rubs, gallops or clicks. No murmurs. Gastrointestinal system: Abdomen is nondistended, soft and nontender. No organomegaly or masses felt. Normal bowel sounds heard. Central nervous system: Alert and oriented. No focal neurological deficits. Extremities: No clubbing,  or cyanosis. No edema. Skin: No rashes, lesions or ulcers. Psychiatry: Judgement and insight appear normal. Mood &  affect appropriate.    The results of significant diagnostics from this hospitalization (including imaging, microbiology, ancillary and laboratory) are listed below for reference.     Procedures and Diagnostic Studies:   CT ABDOMEN PELVIS W CONTRAST  Result Date: 07/25/2023 CLINICAL DATA:  Diarrhea, acute nonlocalized abdominal pain. EXAM: CT ABDOMEN AND PELVIS WITH CONTRAST TECHNIQUE: Multidetector CT imaging of the abdomen and pelvis was performed using the standard protocol following bolus administration of intravenous contrast. RADIATION DOSE REDUCTION: This exam was performed according to the departmental dose-optimization program which includes automated exposure control, adjustment of the mA and/or kV according to patient size and/or use of iterative reconstruction technique. CONTRAST:  75mL OMNIPAQUE IOHEXOL 300 MG/ML  SOLN COMPARISON:  None Available. FINDINGS: Lower chest: No acute abnormality. Hepatobiliary: No focal liver abnormality is seen. No gallstones, gallbladder wall thickening, or biliary dilatation. Pancreas: Unremarkable Spleen: Unremarkable Adrenals/Urinary Tract: Adrenal glands are unremarkable. Kidneys are normal, without renal calculi, focal lesion, or hydronephrosis. Bladder is unremarkable. Stomach/Bowel: There is marked hyperemia, circumferential bowel wall thickening, and surrounding mesenteric edema involving the terminal ileum and continuing to involve the cecum and ascending colon in keeping with changes of an acute infectious or inflammatory ileocolitis. Additionally, there is mucosal hyperemia involving the sigmoid colon and rectum in keeping with an additional focus of a less severe proctocolitis. There is no evidence of obstruction. No free intraperitoneal gas or fluid. No loculated intra-abdominal fluid collections. The appendix is absent. Vascular/Lymphatic: Extensive aortoiliac atherosclerotic calcification. No aortic aneurysm. No pathologic adenopathy  within the  abdomen and pelvis. Reproductive: Uterus and bilateral adnexa are unremarkable. Other: No abdominal wall hernia. Musculoskeletal: Interval T11-L4 posterior lumbar fusion with instrumentation has been performed with bilateral pedicle screws and posterior bars. Remote appearing compression deformities of L1 and L2 appears stable since prior examination. 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 No acute bone abnormality. No suspicious lytic or blastic bone lesion. IMPRESSION: 1. Acute infectious or inflammatory ileocolitis involving the terminal ileum and continuing to involve the cecum and ascending colon. Additional focus of less severe proctocolitis involving the sigmoid colon and rectum. No evidence of obstruction. No free intraperitoneal gas or fluid to suggest perforation. Aortic Atherosclerosis (ICD10-I70.0). Electronically Signed   By: Helyn Numbers M.D.   On: 07/25/2023 19:27   DG Chest Port 1 View  Result Date: 07/25/2023 CLINICAL DATA:  Fever. EXAM: PORTABLE CHEST 1 VIEW COMPARISON:  X-ray 02/21/2021 FINDINGS: Hyperinflation. No consolidation, pneumothorax or effusion. No edema. Normal cardiopericardial silhouette. Calcified aorta. Overlapping cardiac leads. Fixation hardware along the thoracolumbar spine at the edge of the imaging field. Osteopenia. IMPRESSION: Hyperinflation.  No acute cardiopulmonary disease. Electronically Signed   By: Karen Kays M.D.   On: 07/25/2023 17:19     Labs:   Basic Metabolic Panel: Recent Labs  Lab 07/25/23 1515 07/25/23 1516 07/26/23 0727 07/27/23 0755 07/28/23 0950  NA 125*  --  129*  --  133*  K 3.8  --  3.1*  --  2.7*  CL 89*  --  95*  --  105  CO2 23  --  19*  --  20*  GLUCOSE 116*  --  100*  --  107*  BUN 13  --  8  --  <5*  CREATININE 0.93  --  0.81  --  0.73  CALCIUM 9.1  --  8.0*  --  7.8*  MG  --  1.9 1.8 1.9 1.8  PHOS  --   --   --  2.3* 2.1*   GFR Estimated Creatinine Clearance: 40.7 mL/min (by C-G formula based on SCr of 0.73 mg/dL). Liver  Function Tests: Recent Labs  Lab 07/25/23 1515 07/26/23 0727  AST 17 21  ALT 13 16  ALKPHOS 87 88  BILITOT 0.5 0.4  PROT 6.9 5.3*  ALBUMIN 3.7 2.3*   Recent Labs  Lab 07/25/23 1515  LIPASE 29   No results for input(s): "AMMONIA" in the last 168 hours. Coagulation profile No results for input(s): "INR", "PROTIME" in the last 168 hours.  CBC: Recent Labs  Lab 07/25/23 1515 07/26/23 0727  WBC 8.4 7.8  NEUTROABS  --  5.9  HGB 14.4 12.5  HCT 41.5 36.2  MCV 89.2 88.1  PLT 284 271   Cardiac Enzymes: No results for input(s): "CKTOTAL", "CKMB", "CKMBINDEX", "TROPONINI" in the last 168 hours. BNP: Invalid input(s): "POCBNP" CBG: No results for input(s): "GLUCAP" in the last 168 hours. D-Dimer No results for input(s): "DDIMER" in the last 72 hours. Hgb A1c Recent Labs    07/26/23 0727  HGBA1C 5.9*   Lipid Profile No results for input(s): "CHOL", "HDL", "LDLCALC", "TRIG", "CHOLHDL", "LDLDIRECT" in the last 72 hours. Thyroid function studies No results for input(s): "TSH", "T4TOTAL", "T3FREE", "THYROIDAB" in the last 72 hours.  Invalid input(s): "FREET3" Anemia work up No results for input(s): "VITAMINB12", "FOLATE", "FERRITIN", "TIBC", "IRON", "RETICCTPCT" in the last 72 hours. Microbiology Recent Results (from the past 240 hour(s))  Gastrointestinal Panel by PCR , Stool     Status: None   Collection Time: 07/26/23  1:04 AM   Specimen: Stool  Result Value Ref Range Status   Campylobacter species NOT DETECTED NOT DETECTED Final   Plesimonas shigelloides NOT DETECTED NOT DETECTED Final   Salmonella species NOT DETECTED NOT DETECTED Final   Yersinia enterocolitica NOT DETECTED NOT DETECTED Final   Vibrio species NOT DETECTED NOT DETECTED Final   Vibrio cholerae NOT DETECTED NOT DETECTED Final   Enteroaggregative E coli (EAEC) NOT DETECTED NOT DETECTED Final   Enteropathogenic E coli (EPEC) NOT DETECTED NOT DETECTED Final   Enterotoxigenic E coli (ETEC) NOT  DETECTED NOT DETECTED Final   Shiga like toxin producing E coli (STEC) NOT DETECTED NOT DETECTED Final   Shigella/Enteroinvasive E coli (EIEC) NOT DETECTED NOT DETECTED Final   Cryptosporidium NOT DETECTED NOT DETECTED Final   Cyclospora cayetanensis NOT DETECTED NOT DETECTED Final   Entamoeba histolytica NOT DETECTED NOT DETECTED Final   Giardia lamblia NOT DETECTED NOT DETECTED Final   Adenovirus F40/41 NOT DETECTED NOT DETECTED Final   Astrovirus NOT DETECTED NOT DETECTED Final   Norovirus GI/GII NOT DETECTED NOT DETECTED Final   Rotavirus A NOT DETECTED NOT DETECTED Final   Sapovirus (I, II, IV, and V) NOT DETECTED NOT DETECTED Final    Comment: Performed at [facility], [address] [place], [place] [address]  C Difficile Quick Screen w PCR reflex     Status: None   Collection Time: 07/26/23  1:04 AM   Specimen: Stool  Result Value Ref Range Status   C Diff antigen NEGATIVE NEGATIVE Final   C Diff toxin NEGATIVE NEGATIVE Final   C Diff interpretation No C. difficile detected.  Final    Comment: Performed at [facility], [address] [place], [place] [address]     Discharge Instructions:   Discharge Instructions     Diet general   Complete by: As directed    Increase activity slowly   Complete by: As directed       Allergies as of 07/28/2023       Reactions   Actonel [risedronate] Other (See Comments)   Panic attacks and dizziness   Ciprofloxacin Other (See Comments)   Exacerbated severe diarrhea Other reaction(s): Aggravates c-diff   Fentanyl Other (See Comments)   Had trouble waking patient from anesthesia as result, given narcan to counteract, had several weeks of dizziness, uncommon fatigue   Hydrocodone Other (See Comments)   disorientation   Meloxicam Other (See Comments)   Disorientation and confusion   Other Other (See Comments)   SODIUM PENTOTHAL-totally disoriented and irrational for about a week afterwards   Paroxetine Other (See  Comments)   Hyperactivity-didn't eat, sleep or sit still Other reaction(s): Loss of consciousness   Phenergan [promethazine] Diarrhea, Nausea And Vomiting   Didn't work for patient, caused extreme disorientation, violent shaking, hallucinations, inability of recognize husband at that time   Prednisone Other (See Comments)   Hyperactivity-didn't eat, sleep, or sit still Other reaction(s): Hyperactivity   Prochlorperazine Edisylate Other (See Comments)   Didn't work for patient, caused extreme disorientation also, confusion Other reaction(s): Loss of consciousness   Tramadol Other (See Comments)   Disorientation and confusion   Trazodone And Nefazodone Other (See Comments)   Extreme dizziness   Ensure [nutritional Supplements] Diarrhea   Lorazepam Diarrhea   Meclizine Diarrhea   Pantothenic Acid    Prochlorperazine Maleate    Other reaction(s): Loss of consciousness   Promethazine Hcl    Other reaction(s): Loss of consciousness   Risedronate  Sodium    Other reaction(s): Panic attacks   Bupropion Anxiety   150 mg.  Lower dose might be OK.        Medication List     TAKE these medications    amoxicillin-clavulanate 875-125 MG tablet Commonly known as: AUGMENTIN Take 1 tablet by mouth every 12 (twelve) hours.   loperamide 2 MG tablet Commonly known as: IMODIUM A-D Take 4 mg by mouth as needed for diarrhea or loose stools.   mirtazapine 7.5 MG tablet Commonly known as: REMERON Take 3.25 mg by mouth at bedtime.   potassium chloride 10 MEQ tablet Commonly known as: KLOR-CON M Take 4 tablets (40 mEq total) by mouth daily for 3 days. Start taking on: July 29, 2023        Follow-up Information     Barbie Banner, MD Follow up in 3 day(s).   Specialty: Family Medicine Why: BMP check Contact information: [address] [place] [address]                  Time coordinating discharge: ***  Signed:  Joseph Art DO  [facility] 07/28/2023, 1:31 PM

## 2023-07-28 NOTE — Progress Notes (Signed)
Pt is in a calm stable condition being discharged in care of husband and son. All Ivs and monitors have been removed. Discharge instructions have been given.

## 2023-07-28 NOTE — Progress Notes (Signed)
Transition of Care [facility]) - Inpatient Brief Assessment   Patient Details  Name: Shelby Arellano MRN: [id number] Date of Birth: 1935-06-30  Transition of Care [facility] CM/SW Contact:    Janae Bridgeman, RN Phone Number: 07/28/2023, 4:56 PM   Clinical Narrative: Patient admitted for hyponatremia.  No TOC needs at this time.   Transition of Care Asessment: Insurance and Status: (P) Insurance coverage has been reviewed Patient has primary care physician: (P) Yes Home environment has been reviewed: (P) Apartment Prior level of function:: (P) Independent Prior/Current Home Services: (P) No current home services Social Determinants of Health Reivew: (P) SDOH reviewed no interventions necessary Readmission risk has been reviewed: (P) Yes Transition of care needs: (P) no transition of care needs at this time

## 2024-03-04 ENCOUNTER — Encounter: Payer: Medicare Other | Admitting: Nurse Practitioner
